# Patient Record
Sex: Female | Born: 1985
Health system: Southern US, Community
[De-identification: ages and names within clinical notes are randomized; demographics above are authoritative.]

## PROBLEM LIST (undated history)

## (undated) DIAGNOSIS — I4891 Unspecified atrial fibrillation: Secondary | ICD-10-CM

## (undated) DIAGNOSIS — E669 Obesity, unspecified: Secondary | ICD-10-CM

## (undated) DIAGNOSIS — Z3490 Encounter for supervision of normal pregnancy, unspecified, unspecified trimester: Secondary | ICD-10-CM

## (undated) DIAGNOSIS — B999 Unspecified infectious disease: Secondary | ICD-10-CM

## (undated) DIAGNOSIS — IMO0002 Reserved for concepts with insufficient information to code with codable children: Secondary | ICD-10-CM

## (undated) DIAGNOSIS — I10 Essential (primary) hypertension: Secondary | ICD-10-CM

## (undated) DIAGNOSIS — A599 Trichomoniasis, unspecified: Secondary | ICD-10-CM

## (undated) DIAGNOSIS — R87619 Unspecified abnormal cytological findings in specimens from cervix uteri: Secondary | ICD-10-CM

## (undated) DIAGNOSIS — R87629 Unspecified abnormal cytological findings in specimens from vagina: Secondary | ICD-10-CM

## (undated) HISTORY — PX: LEEP: SHX91

## (undated) HISTORY — PX: NO PAST SURGERIES: SHX2092

## (undated) HISTORY — PX: TOOTH EXTRACTION: SUR596

---
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
## 2007-06-13 ENCOUNTER — Emergency Department (HOSPITAL_COMMUNITY): Admission: EM | Admit: 2007-06-13 | Discharge: 2007-06-13 | Payer: Self-pay | Admitting: Emergency Medicine

## 2007-07-05 ENCOUNTER — Emergency Department (HOSPITAL_COMMUNITY): Admission: EM | Admit: 2007-07-05 | Discharge: 2007-07-05 | Payer: Self-pay | Admitting: Family Medicine

## 2007-10-05 ENCOUNTER — Inpatient Hospital Stay (HOSPITAL_COMMUNITY): Admission: AD | Admit: 2007-10-05 | Discharge: 2007-10-05 | Payer: Self-pay | Admitting: Obstetrics & Gynecology

## 2008-03-20 ENCOUNTER — Inpatient Hospital Stay (HOSPITAL_COMMUNITY): Admission: AD | Admit: 2008-03-20 | Discharge: 2008-03-20 | Payer: Self-pay | Admitting: Family Medicine

## 2008-03-29 ENCOUNTER — Emergency Department (HOSPITAL_COMMUNITY): Admission: EM | Admit: 2008-03-29 | Discharge: 2008-03-29 | Payer: Self-pay | Admitting: Emergency Medicine

## 2008-10-19 ENCOUNTER — Emergency Department (HOSPITAL_COMMUNITY): Admission: EM | Admit: 2008-10-19 | Discharge: 2008-10-19 | Payer: Self-pay | Admitting: Emergency Medicine

## 2008-11-21 ENCOUNTER — Emergency Department (HOSPITAL_COMMUNITY): Admission: EM | Admit: 2008-11-21 | Discharge: 2008-11-21 | Payer: Self-pay | Admitting: Family Medicine

## 2009-10-17 ENCOUNTER — Ambulatory Visit: Payer: Self-pay | Admitting: Obstetrics and Gynecology

## 2009-10-17 ENCOUNTER — Inpatient Hospital Stay (HOSPITAL_COMMUNITY): Admission: AD | Admit: 2009-10-17 | Discharge: 2009-10-17 | Payer: Self-pay | Admitting: Obstetrics & Gynecology

## 2009-12-27 ENCOUNTER — Ambulatory Visit: Payer: Self-pay | Admitting: Obstetrics & Gynecology

## 2009-12-27 ENCOUNTER — Encounter: Payer: Self-pay | Admitting: Obstetrics and Gynecology

## 2009-12-27 LAB — CONVERTED CEMR LAB
HCT: 39.4 % (ref 36.0–46.0)
Hemoglobin: 12.7 g/dL (ref 12.0–15.0)
Hgb A1c MFr Bld: 5.6 % (ref <5.7)
MCHC: 32.2 g/dL (ref 30.0–36.0)
MCV: 81.6 fL (ref 78.0–100.0)
Platelets: 300 10*3/uL (ref 150–400)
Prolactin: 11.8 ng/mL
RBC: 4.83 M/uL (ref 3.87–5.11)
RDW: 14.4 % (ref 11.5–15.5)
TSH: 3.118 microintl units/mL (ref 0.350–4.500)
Testosterone: 38.54 ng/dL (ref 10–70)
Trich, Wet Prep: NONE SEEN
WBC: 5.8 10*3/uL (ref 4.0–10.5)
Yeast Wet Prep HPF POC: NONE SEEN
hCG, Beta Chain, Quant, S: <2 milliintl units/mL

## 2010-01-01 ENCOUNTER — Ambulatory Visit (HOSPITAL_COMMUNITY)
Admission: RE | Admit: 2010-01-01 | Discharge: 2010-01-01 | Payer: Self-pay | Source: Home / Self Care | Admitting: Family Medicine

## 2010-03-03 ENCOUNTER — Emergency Department (HOSPITAL_COMMUNITY): Admission: EM | Admit: 2010-03-03 | Discharge: 2010-03-03 | Payer: Self-pay | Admitting: Emergency Medicine

## 2010-04-04 ENCOUNTER — Ambulatory Visit: Payer: Self-pay | Admitting: Obstetrics & Gynecology

## 2010-04-04 ENCOUNTER — Other Ambulatory Visit
Admission: RE | Admit: 2010-04-04 | Discharge: 2010-04-04 | Payer: Self-pay | Source: Home / Self Care | Admitting: Family Medicine

## 2010-05-02 ENCOUNTER — Emergency Department (HOSPITAL_COMMUNITY): Admission: EM | Admit: 2010-05-02 | Discharge: 2010-03-03 | Payer: Self-pay | Admitting: Emergency Medicine

## 2010-05-30 ENCOUNTER — Ambulatory Visit
Admission: RE | Admit: 2010-05-30 | Discharge: 2010-05-30 | Payer: Self-pay | Source: Home / Self Care | Attending: Obstetrics and Gynecology | Admitting: Obstetrics and Gynecology

## 2010-06-04 ENCOUNTER — Emergency Department (HOSPITAL_COMMUNITY)
Admission: EM | Admit: 2010-06-04 | Discharge: 2010-06-04 | Payer: Self-pay | Source: Home / Self Care | Admitting: Family Medicine

## 2010-06-20 ENCOUNTER — Ambulatory Visit: Admit: 2010-06-20 | Payer: Self-pay | Admitting: Obstetrics and Gynecology

## 2010-07-10 ENCOUNTER — Encounter: Payer: Self-pay | Admitting: Family Medicine

## 2010-08-06 LAB — POCT PREGNANCY, URINE: Preg Test, Ur: NEGATIVE

## 2010-08-12 LAB — URINALYSIS, ROUTINE W REFLEX MICROSCOPIC
Bilirubin Urine: NEGATIVE
Glucose, UA: NEGATIVE mg/dL
Hgb urine dipstick: NEGATIVE
Ketones, ur: NEGATIVE mg/dL
Nitrite: NEGATIVE
Protein, ur: NEGATIVE mg/dL
Specific Gravity, Urine: >1.03 — ABNORMAL HIGH (ref 1.005–1.030)
Urobilinogen, UA: 0.2 mg/dL (ref 0.0–1.0)
pH: 5.5 (ref 5.0–8.0)

## 2010-08-12 LAB — URINE MICROSCOPIC-ADD ON

## 2010-08-12 LAB — WET PREP, GENITAL: Trich, Wet Prep: NONE SEEN

## 2010-08-12 LAB — PROLACTIN: Prolactin: 8.5 ng/mL

## 2010-08-12 LAB — GC/CHLAMYDIA PROBE AMP, GENITAL
Chlamydia, DNA Probe: NEGATIVE
GC Probe Amp, Genital: NEGATIVE

## 2010-08-12 LAB — TSH: TSH: 2.983 u[IU]/mL (ref 0.350–4.500)

## 2010-08-12 LAB — HCG, SERUM, QUALITATIVE: Preg, Serum: NEGATIVE

## 2010-08-12 LAB — POCT PREGNANCY, URINE: Preg Test, Ur: NEGATIVE

## 2010-08-21 ENCOUNTER — Encounter: Payer: Self-pay | Admitting: Family Medicine

## 2010-09-02 LAB — POCT PREGNANCY, URINE: Preg Test, Ur: NEGATIVE

## 2010-09-02 LAB — POCT RAPID STREP A (OFFICE): Streptococcus, Group A Screen (Direct): NEGATIVE

## 2010-09-03 LAB — URINE MICROSCOPIC-ADD ON

## 2010-09-03 LAB — URINALYSIS, ROUTINE W REFLEX MICROSCOPIC
Bilirubin Urine: NEGATIVE
Glucose, UA: NEGATIVE mg/dL
Hgb urine dipstick: NEGATIVE
Ketones, ur: NEGATIVE mg/dL
Nitrite: NEGATIVE
Protein, ur: NEGATIVE mg/dL
Specific Gravity, Urine: 1.023 (ref 1.005–1.030)
Urobilinogen, UA: 1 mg/dL (ref 0.0–1.0)
pH: 6.5 (ref 5.0–8.0)

## 2010-09-03 LAB — POCT PREGNANCY, URINE: Preg Test, Ur: NEGATIVE

## 2010-09-09 ENCOUNTER — Emergency Department (HOSPITAL_COMMUNITY)
Admission: EM | Admit: 2010-09-09 | Discharge: 2010-09-10 | Disposition: A | Discharge status: Home / Self Care | Payer: Medicaid Other | Attending: Emergency Medicine | Admitting: Emergency Medicine

## 2010-09-09 DIAGNOSIS — N390 Urinary tract infection, site not specified: Secondary | ICD-10-CM | POA: Insufficient documentation

## 2010-09-09 DIAGNOSIS — B9689 Other specified bacterial agents as the cause of diseases classified elsewhere: Secondary | ICD-10-CM | POA: Insufficient documentation

## 2010-09-09 DIAGNOSIS — N760 Acute vaginitis: Secondary | ICD-10-CM | POA: Insufficient documentation

## 2010-09-09 DIAGNOSIS — R109 Unspecified abdominal pain: Secondary | ICD-10-CM | POA: Insufficient documentation

## 2010-09-09 DIAGNOSIS — A499 Bacterial infection, unspecified: Secondary | ICD-10-CM | POA: Insufficient documentation

## 2010-09-09 LAB — PREGNANCY, URINE: Preg Test, Ur: NEGATIVE

## 2010-09-09 LAB — URINALYSIS, ROUTINE W REFLEX MICROSCOPIC
Bilirubin Urine: NEGATIVE
Glucose, UA: NEGATIVE mg/dL
Hgb urine dipstick: NEGATIVE
Ketones, ur: NEGATIVE mg/dL
Nitrite: NEGATIVE
Protein, ur: NEGATIVE mg/dL
Specific Gravity, Urine: 1.012 (ref 1.005–1.030)
Urobilinogen, UA: 1 mg/dL (ref 0.0–1.0)
pH: 7.5 (ref 5.0–8.0)

## 2010-09-09 LAB — URINE MICROSCOPIC-ADD ON

## 2010-09-10 LAB — WET PREP, GENITAL
Trich, Wet Prep: NONE SEEN
Yeast Wet Prep HPF POC: NONE SEEN

## 2010-09-10 LAB — GC/CHLAMYDIA PROBE AMP, GENITAL
Chlamydia, DNA Probe: NEGATIVE
GC Probe Amp, Genital: NEGATIVE

## 2010-10-02 ENCOUNTER — Other Ambulatory Visit: Payer: Self-pay | Admitting: Family Medicine

## 2010-10-02 ENCOUNTER — Other Ambulatory Visit (HOSPITAL_COMMUNITY)
Admission: RE | Admit: 2010-10-02 | Discharge: 2010-10-02 | Disposition: A | Discharge status: Home / Self Care | Payer: Medicaid Other | Source: Ambulatory Visit | Attending: Family Medicine | Admitting: Family Medicine

## 2010-10-02 ENCOUNTER — Encounter (INDEPENDENT_AMBULATORY_CARE_PROVIDER_SITE_OTHER): Payer: Medicaid Other | Admitting: Family Medicine

## 2010-10-02 DIAGNOSIS — N870 Mild cervical dysplasia: Secondary | ICD-10-CM | POA: Insufficient documentation

## 2010-10-02 DIAGNOSIS — R87613 High grade squamous intraepithelial lesion on cytologic smear of cervix (HGSIL): Secondary | ICD-10-CM

## 2010-10-02 LAB — POCT PREGNANCY, URINE: Preg Test, Ur: NEGATIVE

## 2010-10-03 NOTE — Group Therapy Note (Signed)
NAMEMAJEL, GIEL                ACCOUNT NO.:  [id number]  MEDICAL RECORD NO.:  [id number]           PATIENT TYPE:  A  LOCATION:  [facility]                   FACILITY:  [facility]  PHYSICIAN:  Lucina Mellow, DO   DATE OF BIRTH:  12/20/1985  DATE OF SERVICE:                                 CLINIC NOTE  The patient presents today for a LEEP procedure.  HISTORY OF PRESENT ILLNESS:  The patient is a 25 year old African American female gravida 1, para 1 who had HGSIL noted on her colposcopy examination done in November 2011.  The patient was told that she needed to go LEEP procedure done and it was at that time noted that it was an emergency but in fact need to be done.  She did miss the appointment that was scheduled on November 28 and she has since made that appointment and she presents for that.  She states that she is very nervous about the procedure.  We talked about the procedure and she gives [place] a written permission and consent which is witnessed.  PREOPERATIVE DIAGNOSIS:  High-grade squamous intraepithelial lesion.  POSTOPERATIVE DIAGNOSIS:  high-grade squamous intraepithelial lesion, cervical intraepithelial neoplasia II.  Urine pregnancy test was negative.  ANESTHESIA:  Lidocaine 15 mL circumferential cervical block.  PROCEDURE:  The patient was placed in a dorsal lithotomy position.  Her cervix was identified easily with a speculum.  It was stained with acetowhite acetic acid and iodine to identify the lesion that was noted on the colposcopy at approximately 1 o'clock.  Area of the lesion previously noted at about the 7 o'clock area seems to have resolved. The appropriate sized LEEP curette was obtained and a circumferential incisional biopsy was made of the cervix.  Of note, the patient experienced some breakthrough pain when the cervix was being biopsied at approximately the 1 o'clock area and the procedure was stopped momentarily to inject the additional 5 mL of  lidocaine.  The LEEP was then completed and the biopsy samples were all placed into the specimen cup.  The rollerball then was used to control bleeding and Monsel solution was used to run to control bleeding.  The area was observed. There was minimal bleeding noted.  Speculum was removed and the procedure was ended.  The patient states that she was having severe cramping at the end of the procedure and she was given 800 mg of Motrin p.o. in the clinic for this pain.  Prescription provided include Percocet 5/325 to take for severe pain and 800 mg Motrin to take around the clock every 8 hours with meals to control for the cramping.  The patient was also given a prescription for Flagyl 500 mg b.i.d. to take now or to wait if she develops heavy discharge to help with probable bacterial vaginosis that will ensue.  The patient will follow up in clinic in approximately 2 weeks to discuss these results and if further treatment is necessary.  She can be followed by every 6 months Pap testing.  The patient's questions were all answered and she voiced understanding of the plan.          ______________________________  Lucina Mellow, DO    SH/MEDQ  D:  10/02/2010  T:  10/03/2010  Job:  [id number]

## 2010-10-16 ENCOUNTER — Ambulatory Visit (INDEPENDENT_AMBULATORY_CARE_PROVIDER_SITE_OTHER): Payer: Medicaid Other | Admitting: Family Medicine

## 2010-10-16 DIAGNOSIS — R87612 Low grade squamous intraepithelial lesion on cytologic smear of cervix (LGSIL): Secondary | ICD-10-CM

## 2010-10-17 NOTE — Group Therapy Note (Signed)
Deanna [name], Deanna [name]                ACCOUNT NO.:  [id number]  MEDICAL RECORD NO.:  [id number]           PATIENT TYPE:  A  LOCATION:  [facility]                   FACILITY:  [facility]  PHYSICIAN:  Lucina Mellow, DO   DATE OF BIRTH:  July 25, 1985  DATE OF SERVICE:                                 CLINIC NOTE  The patient presents to the [facility] the afternoon of Oct 16, 2010.  The patient presents for her LEEP results.  The patient states that since her procedure she had about 3 days of cramping and discomfort for which she took the Motrin and Percocet that control her discomfort.  She no longer feels as if she is having any discharge or discomfort.  She states that she has had breast pain which she thinks is related to the fact that her cycle should start any day now.  The patient was asking about ability to get pregnant at this time, as she would like to conceive.  She states she has not started intercourse at this time.  Lab results available as the pathology from the LEEP procedure and that showed low-grade squamous intraepithelial lesion CIN-I with negative surgical margins of resection.  ASSESSMENT:  LGISL on LEEP.  She had HGSIL on her Pap and her colposcopy, so the LGSIL is a downgrade from the lesion concern for negative surgical margins states that the procedure did not have to be repeated at this time.  The patient should have a repeat Pap every 6 months for approximately 2 years and then she can go to yearly screening after that point.  The patient's questions were all answered and she agrees with the plan.          ______________________________ Lucina Mellow, DO    SH/MEDQ  D:  10/16/2010  T:  10/17/2010  Job:  [id number]

## 2011-02-08 ENCOUNTER — Inpatient Hospital Stay (HOSPITAL_COMMUNITY)
Admission: AD | Admit: 2011-02-08 | Discharge: 2011-02-08 | Disposition: A | Discharge status: Home / Self Care | Payer: Self-pay | Source: Ambulatory Visit | Attending: Obstetrics and Gynecology | Admitting: Obstetrics and Gynecology

## 2011-02-08 ENCOUNTER — Encounter (HOSPITAL_COMMUNITY): Payer: Self-pay

## 2011-02-08 DIAGNOSIS — A5901 Trichomonal vulvovaginitis: Secondary | ICD-10-CM | POA: Insufficient documentation

## 2011-02-08 DIAGNOSIS — O26899 Other specified pregnancy related conditions, unspecified trimester: Secondary | ICD-10-CM

## 2011-02-08 DIAGNOSIS — B9689 Other specified bacterial agents as the cause of diseases classified elsewhere: Secondary | ICD-10-CM | POA: Insufficient documentation

## 2011-02-08 DIAGNOSIS — O2340 Unspecified infection of urinary tract in pregnancy, unspecified trimester: Secondary | ICD-10-CM

## 2011-02-08 DIAGNOSIS — R519 Headache, unspecified: Secondary | ICD-10-CM

## 2011-02-08 DIAGNOSIS — O2390 Unspecified genitourinary tract infection in pregnancy, unspecified trimester: Secondary | ICD-10-CM | POA: Insufficient documentation

## 2011-02-08 DIAGNOSIS — N760 Acute vaginitis: Secondary | ICD-10-CM | POA: Insufficient documentation

## 2011-02-08 DIAGNOSIS — N390 Urinary tract infection, site not specified: Secondary | ICD-10-CM | POA: Insufficient documentation

## 2011-02-08 DIAGNOSIS — A499 Bacterial infection, unspecified: Secondary | ICD-10-CM | POA: Insufficient documentation

## 2011-02-08 DIAGNOSIS — O98819 Other maternal infectious and parasitic diseases complicating pregnancy, unspecified trimester: Secondary | ICD-10-CM | POA: Insufficient documentation

## 2011-02-08 DIAGNOSIS — Z331 Pregnant state, incidental: Secondary | ICD-10-CM

## 2011-02-08 HISTORY — DX: Trichomoniasis, unspecified: A59.9

## 2011-02-08 LAB — URINE MICROSCOPIC-ADD ON

## 2011-02-08 LAB — URINALYSIS, ROUTINE W REFLEX MICROSCOPIC
Bilirubin Urine: NEGATIVE
Glucose, UA: NEGATIVE mg/dL
Hgb urine dipstick: NEGATIVE
Ketones, ur: NEGATIVE mg/dL
Nitrite: POSITIVE — AB
Protein, ur: NEGATIVE mg/dL
Specific Gravity, Urine: 1.02 (ref 1.005–1.030)
Urobilinogen, UA: 0.2 mg/dL (ref 0.0–1.0)
pH: 6.5 (ref 5.0–8.0)

## 2011-02-08 LAB — POCT PREGNANCY, URINE: Preg Test, Ur: POSITIVE

## 2011-02-08 LAB — WET PREP, GENITAL: Yeast Wet Prep HPF POC: NONE SEEN

## 2011-02-08 MED ORDER — ACETAMINOPHEN 325 MG PO TABS
650.0000 mg | ORAL_TABLET | Freq: Once | ORAL | Status: AC
  Administered 2011-02-08: 650 mg via ORAL
  Filled 2011-02-08: qty 2

## 2011-02-08 MED ORDER — PROMETHAZINE HCL 25 MG PO TABS: 25.0000 mg | ORAL_TABLET | Freq: Four times a day (QID) | ORAL | Status: AC | PRN

## 2011-02-08 MED ORDER — ONDANSETRON 8 MG PO TBDP: 8.0000 mg | ORAL_TABLET | Freq: Three times a day (TID) | ORAL | Status: AC | PRN

## 2011-02-08 MED ORDER — PRENATAL RX 60-1 MG PO TABS: 1.0000 | ORAL_TABLET | Freq: Every day | ORAL | Status: DC

## 2011-02-08 MED ORDER — METRONIDAZOLE 500 MG PO TABS: 500.0000 mg | ORAL_TABLET | Freq: Two times a day (BID) | ORAL | Status: AC

## 2011-02-08 MED ORDER — NITROFURANTOIN MONOHYD MACRO 100 MG PO CAPS
100.0000 mg | ORAL_CAPSULE | Freq: Once | ORAL | Status: AC
  Administered 2011-02-08: 100 mg via ORAL
  Filled 2011-02-08: qty 1

## 2011-02-08 NOTE — Progress Notes (Signed)
Patient reports last period 12/25/2010  Has been having headaches, 5 positive home pregnancy tests, did not take anything for headache.

## 2011-02-08 NOTE — ED Provider Notes (Signed)
History     Chief Complaint  Patient presents with  . Amenorrhea  . Headache   HPI Pt is [redacted]w[redacted]d pregnant with LMP May 1.  She complains of frontal headaches that come and go.  She has not had nausea or vision changes with her headaches.  She has not taken any medication for her headaches.  She took a home pregnancy test yesterday which was positive.  She denies spotting or bleeding or cramping.  She has been urinating frequently with adequate emptying.  She does have a history of UTI several months ago.     Past Medical History  Diagnosis Date  . Trichomonas     Past Surgical History  Procedure Date  . No past surgeries     History reviewed. No pertinent family history.  History  Substance Use Topics  . Smoking status: Not on file  . Smokeless tobacco: Not on file  . Alcohol Use: Not on file    Allergies: No Known Allergies  Prescriptions prior to admission  Medication Sig Dispense Refill  . ibuprofen (MIDOL CRAMP FORMULA MAX ST) 200 MG tablet Take 200 mg by mouth every 6 (six) hours as needed. For cramps.         Review of Systems  Constitutional: Negative.  Negative for fever and chills.  Eyes: Negative.  Negative for blurred vision.  Gastrointestinal: Positive for nausea. Negative for vomiting, abdominal pain, diarrhea and constipation.  Genitourinary: Positive for frequency. Negative for dysuria and urgency.  Skin: Negative.    Physical Exam   Blood pressure 123/76, pulse 88, temperature 98.6 F (37 C), temperature source Oral, resp. rate 18, height 5\' 5"  (1.651 m), weight 249 lb (112.946 kg), last menstrual period 12/25/2010.  Physical Exam  Vitals reviewed. Constitutional: She is oriented to person, place, and time. She appears well-developed and well-nourished.  HENT:  Head: Normocephalic.  Eyes: Pupils are equal, round, and reactive to light.  Neck: Normal range of motion. Neck supple.  Cardiovascular: Normal rate.   Respiratory: Effort normal.  GI:  Soft. There is no tenderness.  Genitourinary:       Vagina- small amount of frothy white discharge in vatul; cervix clean nontender; uterus NSSC nontender; adnexal without palpable enlargement; nontender; exam complicated by habitus  Musculoskeletal: Normal range of motion.  Neurological: She is alert and oriented to person, place, and time.  Skin: Skin is warm and dry.  Psychiatric: She has a normal mood and affect.    MAU Course  Procedures Tylenol given for headache and one dose of Macrobid Pelvic exam performed with wet prep and GC/ chlamydia Trich and clue cells on wet prep- will give prescription for Flagyl Urine culture ordered  Pregnancy verification letter Headache relieved with Tylenol   Assessment and Plan  Pregnant [redacted] weeks- pregnancy verification letter UTI-prescription for MAcrobid BID for 7 days Trichomonas and BV- prescription for Flagyl 500mg  BID for 7 days  Tashira Torre 02/08/2011, 8:23 AM

## 2011-02-08 NOTE — Progress Notes (Signed)
Pt presents to MAU with complaints of Amenorrhea LMP Aug 1, headaches and positive pregnancy tests times 5

## 2011-02-09 ENCOUNTER — Telehealth (HOSPITAL_COMMUNITY): Payer: Self-pay | Admitting: *Deleted

## 2011-02-10 LAB — URINE CULTURE
Colony Count: >=100000
Culture  Setup Time: 201209152010

## 2011-02-10 LAB — GC/CHLAMYDIA PROBE AMP, GENITAL
Chlamydia, DNA Probe: NEGATIVE
GC Probe Amp, Genital: NEGATIVE

## 2011-02-10 NOTE — ED Provider Notes (Signed)
Attestation of Attending Supervision of Advanced Practitioner: Evaluation and management procedures were performed by the PA/NP/CNM/OB Fellow under my supervision/collaboration. Chart reviewed and agree with management and plan.  Donyae Kilner A 02/10/2011 1:03 PM

## 2011-02-11 ENCOUNTER — Inpatient Hospital Stay (HOSPITAL_COMMUNITY)
Admission: AD | Admit: 2011-02-11 | Discharge: 2011-02-11 | Disposition: A | Discharge status: Home / Self Care | Payer: Medicaid Other | Source: Ambulatory Visit | Attending: Obstetrics & Gynecology | Admitting: Obstetrics & Gynecology

## 2011-02-11 ENCOUNTER — Inpatient Hospital Stay (HOSPITAL_COMMUNITY): Payer: Medicaid Other

## 2011-02-11 ENCOUNTER — Encounter (HOSPITAL_COMMUNITY): Payer: Self-pay | Admitting: *Deleted

## 2011-02-11 DIAGNOSIS — O219 Vomiting of pregnancy, unspecified: Secondary | ICD-10-CM

## 2011-02-11 DIAGNOSIS — O26899 Other specified pregnancy related conditions, unspecified trimester: Secondary | ICD-10-CM

## 2011-02-11 DIAGNOSIS — R519 Headache, unspecified: Secondary | ICD-10-CM

## 2011-02-11 DIAGNOSIS — R51 Headache: Secondary | ICD-10-CM

## 2011-02-11 DIAGNOSIS — O210 Mild hyperemesis gravidarum: Secondary | ICD-10-CM | POA: Insufficient documentation

## 2011-02-11 LAB — URINALYSIS, ROUTINE W REFLEX MICROSCOPIC
Bilirubin Urine: NEGATIVE
Glucose, UA: NEGATIVE mg/dL
Hgb urine dipstick: NEGATIVE
Ketones, ur: NEGATIVE mg/dL
Leukocytes, UA: NEGATIVE
Nitrite: NEGATIVE
Protein, ur: NEGATIVE mg/dL
Specific Gravity, Urine: 1.01 (ref 1.005–1.030)
Urobilinogen, UA: 0.2 mg/dL (ref 0.0–1.0)
pH: 6.5 (ref 5.0–8.0)

## 2011-02-11 MED ORDER — DEXAMETHASONE 0.5 MG PO TABS: 1.0000 mg | ORAL_TABLET | Freq: Once | ORAL | Status: DC

## 2011-02-11 MED ORDER — METOCLOPRAMIDE HCL 10 MG PO TABS
10.0000 mg | ORAL_TABLET | Freq: Once | ORAL | Status: AC
  Administered 2011-02-11: 10 mg via ORAL
  Filled 2011-02-11: qty 1

## 2011-02-11 MED ORDER — ACETAMINOPHEN-CODEINE #3 300-30 MG PO TABS: 1.0000 | ORAL_TABLET | Freq: Four times a day (QID) | ORAL | Status: AC | PRN

## 2011-02-11 MED ORDER — DIPHENHYDRAMINE HCL 25 MG PO CAPS
25.0000 mg | ORAL_CAPSULE | Freq: Once | ORAL | Status: AC
  Administered 2011-02-11: 25 mg via ORAL
  Filled 2011-02-11: qty 1

## 2011-02-11 MED ORDER — DEXAMETHASONE 0.5 MG PO TABS
1.0000 mg | ORAL_TABLET | Freq: Once | ORAL | Status: AC
  Administered 2011-02-11: 1 mg via ORAL
  Filled 2011-02-11: qty 2

## 2011-02-11 NOTE — ED Provider Notes (Signed)
History     No chief complaint on file.  HPI  Pt is [redacted]w[redacted]d pregnant and presents with headache and nausea and vomiting.  She was seen on Saturday 9/15 and was given a prescription for Zofran and Phenergan, however, pt did not get filled due to cost.  She has been taking Tylenol for her headaches which helps temporarily but returns.  Pt had occ headache prior to pregnancy.  Her headache is frontal.  She has been cramping earlier today and yesterday like menstrual cramps.   She denies constipation or diarrhea; no spotting or bleeding.  She denies pain with urination.    Past Medical History  Diagnosis Date  . Trichomonas     Past Surgical History  Procedure Date  . No past surgeries     No family history on file.  History  Substance Use Topics  . Smoking status: Never Smoker   . Smokeless tobacco: Not on file  . Alcohol Use: No    Allergies: No Known Allergies  Prescriptions prior to admission  Medication Sig Dispense Refill  . metroNIDAZOLE (FLAGYL) 500 MG tablet Take 1 tablet (500 mg total) by mouth 2 (two) times daily.  14 tablet  0  . Prenatal Vit-Fe Fumarate-FA (PRENATAL MULTIVITAMIN) 60-1 MG tablet Take 1 tablet by mouth daily.  30 tablet  5  . ondansetron (ZOFRAN ODT) 8 MG disintegrating tablet Take 1 tablet (8 mg total) by mouth every 8 (eight) hours as needed for nausea.  20 tablet  0  . promethazine (PHENERGAN) 25 MG tablet Take 1 tablet (25 mg total) by mouth every 6 (six) hours as needed for nausea.  30 tablet  0    Review of Systems  Constitutional: Negative.  Negative for fever.  Gastrointestinal: Positive for nausea and abdominal pain. Negative for constipation.  Neurological: Positive for dizziness and headaches. Negative for sensory change, speech change and focal weakness.   Physical Exam   Blood pressure 136/82, pulse 127, temperature 99.3 F (37.4 C), temperature source Oral, resp. rate 20, height 5\' 5"  (1.651 m), weight 250 lb (113.399 kg), last  menstrual period 12/25/2010.  Physical Exam  Vitals reviewed. Constitutional: She is oriented to person, place, and time. She appears well-developed and well-nourished.  HENT:  Head: Normocephalic.  Eyes: Pupils are equal, round, and reactive to light.  Cardiovascular: Normal rate.   Respiratory: Effort normal.  GI: Soft.  Musculoskeletal: Normal range of motion.  Neurological: She is alert and oriented to person, place, and time.  Skin: Skin is warm and dry.  Psychiatric: She has a normal mood and affect.    MAU Course  Procedures Technique: Transabdominal ultrasound was performed for evaluation  of the gestation as well as the maternal uterus and adnexal  regions.  Comparison: None.  Intrauterine gestational sac: Single intrauterine gestational sac  visualized, normal in shape.  Yolk sac: Present  Embryo: Present  Cardiac Activity: Present  Heart Rate: 97 bpm  CRL: 2.2 mm 5w 6d [place] EDC: 10/07/2028  Maternal uterus/Adnexae:  No subchorionic hemorrhage. Normal sonographic appearance to the  ovaries. No free fluid.  IMPRESSION:  Single intrauterine gestation identified, with cardiac activity  documented. Estimated age of 5 weeks 6 days.      Assessment and Plan  Headache in pregnancy- prescription for Tylenol #3 if needed Nausea and vomiting in pregnancy- prescriptions for Phenergan and Zofran with discussion of small frequent meals and pt instructions for morning sickness Follow up with OB care  Deanna [name] 02/11/2011, 8:16 PM

## 2011-02-11 NOTE — Progress Notes (Signed)
Pt states she has been on flagyl since Sat-every time she takes it she vomits-she started getting lightfheaded yesterday and it has gotten worse today,has a headache unrelieved by tylenol and is cramping

## 2011-02-12 ENCOUNTER — Telehealth: Payer: Self-pay | Admitting: *Deleted

## 2011-02-12 NOTE — Telephone Encounter (Signed)
Pt left message stating that she was told by the doctor to call us as soon as she had a positive pregnancy test because of her past surgery and cancer cells.

## 2011-02-12 NOTE — Telephone Encounter (Signed)
Called pt. She has initiated her application for medicaid and has her first appt @ [facility] on 02/13/11. I clarified to pt that she does not and did not have cancer. I explained that she had abnormal cells on her pap and Leep which over time might turn into cancer and she should continue with close follow up as advised. I also stated that if there is any reason for her to be seen @ our clinic, she will be referred by [facility].  Pt voiced understanding

## 2011-02-13 LAB — WET PREP, GENITAL
Trich, Wet Prep: NONE SEEN
Yeast Wet Prep HPF POC: NONE SEEN

## 2011-02-13 LAB — POCT URINALYSIS DIP (DEVICE)
Bilirubin Urine: NEGATIVE
Glucose, UA: NEGATIVE
Hgb urine dipstick: NEGATIVE
Ketones, ur: NEGATIVE
Nitrite: NEGATIVE
Operator id: 270961
Protein, ur: NEGATIVE
Specific Gravity, Urine: 1.025
Urobilinogen, UA: 1
pH: 7

## 2011-02-13 LAB — POCT PREGNANCY, URINE
Operator id: 270961
Preg Test, Ur: NEGATIVE

## 2011-02-13 LAB — GC/CHLAMYDIA PROBE AMP, GENITAL
Chlamydia, DNA Probe: NEGATIVE
GC Probe Amp, Genital: NEGATIVE

## 2011-02-17 NOTE — Telephone Encounter (Signed)
Agree with information provided by Diane Day to the patient about initiating care with the HD.

## 2011-02-24 LAB — URINALYSIS, ROUTINE W REFLEX MICROSCOPIC
Bilirubin Urine: NEGATIVE
Glucose, UA: NEGATIVE
Hgb urine dipstick: NEGATIVE
Ketones, ur: NEGATIVE
Nitrite: NEGATIVE
Protein, ur: NEGATIVE
Specific Gravity, Urine: <1.005 — ABNORMAL LOW
Urobilinogen, UA: 0.2
pH: 6.5

## 2011-02-24 LAB — RAPID STREP SCREEN (MED CTR MEBANE ONLY): Streptococcus, Group A Screen (Direct): NEGATIVE

## 2011-02-24 LAB — POCT PREGNANCY, URINE: Preg Test, Ur: NEGATIVE

## 2011-03-25 ENCOUNTER — Inpatient Hospital Stay (HOSPITAL_COMMUNITY)
Admission: AD | Admit: 2011-03-25 | Discharge: 2011-03-25 | Disposition: A | Discharge status: Home / Self Care | Payer: Self-pay | Source: Ambulatory Visit | Attending: Obstetrics & Gynecology | Admitting: Obstetrics & Gynecology

## 2011-03-25 ENCOUNTER — Encounter (HOSPITAL_COMMUNITY): Payer: Self-pay | Admitting: *Deleted

## 2011-03-25 DIAGNOSIS — O99891 Other specified diseases and conditions complicating pregnancy: Secondary | ICD-10-CM | POA: Insufficient documentation

## 2011-03-25 DIAGNOSIS — J029 Acute pharyngitis, unspecified: Secondary | ICD-10-CM

## 2011-03-25 DIAGNOSIS — J069 Acute upper respiratory infection, unspecified: Secondary | ICD-10-CM | POA: Insufficient documentation

## 2011-03-25 HISTORY — DX: Reserved for concepts with insufficient information to code with codable children: IMO0002

## 2011-03-25 HISTORY — DX: Unspecified abnormal cytological findings in specimens from cervix uteri: R87.619

## 2011-03-25 LAB — URINALYSIS, ROUTINE W REFLEX MICROSCOPIC
Bilirubin Urine: NEGATIVE
Glucose, UA: NEGATIVE mg/dL
Hgb urine dipstick: NEGATIVE
Ketones, ur: NEGATIVE mg/dL
Leukocytes, UA: NEGATIVE
Nitrite: NEGATIVE
Protein, ur: NEGATIVE mg/dL
Specific Gravity, Urine: 1.02 (ref 1.005–1.030)
Urobilinogen, UA: 0.2 mg/dL (ref 0.0–1.0)
pH: 6 (ref 5.0–8.0)

## 2011-03-25 MED ORDER — ONDANSETRON 4 MG PO TBDP: 4.0000 mg | ORAL_TABLET | Freq: Three times a day (TID) | ORAL | Status: AC | PRN

## 2011-03-25 MED ORDER — ONDANSETRON 4 MG PO TBDP
4.0000 mg | ORAL_TABLET | Freq: Once | ORAL | Status: AC
  Administered 2011-03-25: 4 mg via ORAL
  Filled 2011-03-25: qty 1

## 2011-03-25 MED ORDER — ACETAMINOPHEN 500 MG PO TABS
1000.0000 mg | ORAL_TABLET | Freq: Once | ORAL | Status: AC
  Administered 2011-03-25: 1000 mg via ORAL
  Filled 2011-03-25: qty 2

## 2011-03-25 NOTE — Discharge Instructions (Signed)
Salt Water Gargle This solution will help make your mouth and throat feel better. HOME CARE INSTRUCTIONS   Mix 1 teaspoon of salt in 8 ounces of warm water.   Gargle with this solution as much or often as you need or as directed. Swish and gargle gently if you have any sores or wounds in your mouth.   Do not swallow this mixture.  Document Released: 02/14/2004 Document Revised: 01/22/2011 Document Reviewed: 07/07/2008 [facility] Patient Information 2012 [place], [place].

## 2011-03-25 NOTE — Progress Notes (Signed)
Pt states h/a and sore throat began last pm, also has sharp pain on r side, lower abdomen. Denies bleeding or vag d/c changes. Feeling nauseated at times, intermittent vomiting every other day since beginning of pregnancy. No medications taken for s/s.

## 2011-03-25 NOTE — ED Provider Notes (Signed)
[employer] [id number] @[redacted]w[redacted]d  Chief Complaint  Patient presents with  . Headache  . Sore Throat    SUBJECTIVE  HPI: Reports one-day history of nasal congestion, sore throat, headache. She has pain with swallowing. 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 She also has nausea and vomiting which has been present throughout the pregnancy but she has not filled her prescriptions for antiemetics due to not getting Medicaid yet. She has tried no treatments. She would like a work excuse. Review of Systems  Constitutional: Positive for malaise/fatigue. Negative for fever and chills.  HENT: Positive for congestion and sore throat. Negative for ear pain and neck pain.   Eyes: Negative for pain.  Respiratory: Negative for cough, sputum production, shortness of breath and stridor.   Cardiovascular: Negative for chest pain.  Gastrointestinal: Positive for nausea and vomiting. Negative for abdominal pain, diarrhea and constipation.  Genitourinary: Negative for dysuria, urgency and frequency.  Musculoskeletal: Negative for myalgias and back pain.  Neurological: Positive for headaches.    Past Medical History  Diagnosis Date  . Trichomonas   . Abnormal Pap smear     Past Surgical History  Procedure Date  . Leep    History   Social History  . Marital Status: Single    Spouse Name: N/A    Number of Children: N/A  . Years of Education: N/A   Occupational History  . Not on file.   Social History Main Topics  . Smoking status: Never Smoker   . Smokeless tobacco: Not on file  . Alcohol Use: No  . Drug Use: No  . Sexually Active:    Other Topics Concern  . Not on file   Social History Narrative  . No narrative on file   No current facility-administered medications on file prior to encounter.   No current outpatient prescriptions on file prior to encounter.   No Known Allergies  ROS: Pertinent items in HPI  OBJECTIVE  BP 122/57  Pulse 105  Temp(Src)  99 F (37.2 C) (Oral)  Resp 20  Ht 5\' 5"  (1.651 m)  Wt 113.513 kg (250 lb 4 oz)  BMI 41.64 kg/m2  LMP 12/25/2010   DT FHR 160  Physical Exam:  General: WN/WD who looks fatigued and uncomfortable. HEENT:  No tender to percussion over sinuses; EOMs intact, sclerae clear; TMs normal; oropharynx slightly red tonsils 2+, no exudate Neck: supple, shotty and tender ant cervical nodes Abd: soft, nontender Lungs: CTA bilat Pelvic: deferred Back: no CVAT Ext: no edema   MAU course:  After Tylenol and Zofran felt better. Retaining solids. Wants to leave before  GrpABS throat screen result back  ASSESSMENT Viral URI with pharyngitis      PLAN Advised Cepacol, fluids, rest  Work excuse given. Precautions to return for fever or lower respiratory symptoms occur

## 2011-04-01 LAB — OB RESULTS CONSOLE ABO/RH: RH Type: POSITIVE

## 2011-04-01 LAB — OB RESULTS CONSOLE RPR: RPR: NONREACTIVE

## 2011-04-01 LAB — OB RESULTS CONSOLE HIV ANTIBODY (ROUTINE TESTING): HIV: NONREACTIVE

## 2011-04-01 LAB — OB RESULTS CONSOLE HEPATITIS B SURFACE ANTIGEN: Hepatitis B Surface Ag: NEGATIVE

## 2011-04-09 LAB — OB RESULTS CONSOLE ANTIBODY SCREEN: Antibody Screen: NEGATIVE

## 2011-04-09 LAB — OB RESULTS CONSOLE RUBELLA ANTIBODY, IGM: Rubella: IMMUNE

## 2011-05-27 NOTE — L&D Delivery Note (Signed)
Delivery Note At 5:02 PM a viable female was delivered via Vaginal, Spontaneous Delivery (Presentation: Left Occiput Anterior).  APGAR: 9, 10; weight P .   Placenta status: Intact, Spontaneous.  Cord: 3 vessels with the following complications: None.  Anesthesia: Epidural  Episiotomy: None Lacerations: Vaginal;Labial Suture Repair: 3.0 vicryl rapide Est. Blood Loss (mL): 500  Mom to postpartum.  Baby to stay with mother.  [name],Deanna [name] 10/04/2011, 5:27 PM  Br/ B+/ Contra?

## 2011-09-09 LAB — OB RESULTS CONSOLE GBS: GBS: POSITIVE

## 2011-10-03 ENCOUNTER — Encounter (HOSPITAL_COMMUNITY): Payer: Self-pay | Admitting: *Deleted

## 2011-10-03 ENCOUNTER — Inpatient Hospital Stay (HOSPITAL_COMMUNITY)
Admission: AD | Admit: 2011-10-03 | Discharge: 2011-10-03 | Disposition: A | Discharge status: Home / Self Care | Payer: Medicaid Other | Source: Ambulatory Visit | Attending: Obstetrics and Gynecology | Admitting: Obstetrics and Gynecology

## 2011-10-03 ENCOUNTER — Encounter (HOSPITAL_COMMUNITY): Payer: Self-pay

## 2011-10-03 DIAGNOSIS — O479 False labor, unspecified: Secondary | ICD-10-CM | POA: Insufficient documentation

## 2011-10-03 MED ORDER — BUTORPHANOL TARTRATE 2 MG/ML IJ SOLN
2.0000 mg | Freq: Once | INTRAMUSCULAR | Status: AC
  Administered 2011-10-03: 2 mg via INTRAMUSCULAR
  Filled 2011-10-03: qty 1

## 2011-10-03 MED ORDER — OXYCODONE-ACETAMINOPHEN 5-325 MG PO TABS
2.0000 | ORAL_TABLET | Freq: Once | ORAL | Status: AC
  Administered 2011-10-03: 2 via ORAL
  Filled 2011-10-03: qty 2

## 2011-10-03 NOTE — MAU Note (Signed)
Pt up to BR

## 2011-10-03 NOTE — Discharge Instructions (Signed)
Fetal Movement Counts Patient Name: __________________________________________________ Patient Due Date: ____________________ Kick counts is highly recommended in high risk pregnancies, but it is a good idea for every pregnant woman to do. Start counting fetal movements at 28 weeks of the pregnancy. Fetal movements increase after eating a full meal or eating or drinking something sweet (the blood sugar is higher). It is also important to drink plenty of fluids (well hydrated) before doing the count. Lie on your left side because it helps with the circulation or you can sit in a comfortable chair with your arms over your belly (abdomen) with no distractions around you. DOING THE COUNT  Try to do the count the same time of day each time you do it.   Mark the day and time, then see how long it takes for you to feel 10 movements (kicks, flutters, swishes, rolls). You should have at least 10 movements within 2 hours. You will most likely feel 10 movements in much less than 2 hours. If you do not, wait an hour and count again. After a couple of days you will see a pattern.   What you are looking for is a change in the pattern or not enough counts in 2 hours. Is it taking longer in time to reach 10 movements?  SEEK MEDICAL CARE IF:  You feel less than 10 counts in 2 hours. Tried twice.   No movement in one hour.   The pattern is changing or taking longer each day to reach 10 counts in 2 hours.   You feel the baby is not moving as it usually does.  Date: ____________ Movements: ____________ Start time: ____________ Finish time: ____________  Date: ____________ Movements: ____________ Start time: ____________ Finish time: ____________ Date: ____________ Movements: ____________ Start time: ____________ Finish time: ____________ Date: ____________ Movements: ____________ Start time: ____________ Finish time: ____________ Date: ____________ Movements: ____________ Start time: ____________ Finish time:  ____________ Date: ____________ Movements: ____________ Start time: ____________ Finish time: ____________ Date: ____________ Movements: ____________ Start time: ____________ Finish time: ____________ Date: ____________ Movements: ____________ Start time: ____________ Finish time: ____________  Date: ____________ Movements: ____________ Start time: ____________ Finish time: ____________ Date: ____________ Movements: ____________ Start time: ____________ Finish time: ____________ Date: ____________ Movements: ____________ Start time: ____________ Finish time: ____________ Date: ____________ Movements: ____________ Start time: ____________ Finish time: ____________ Date: ____________ Movements: ____________ Start time: ____________ Finish time: ____________ Date: ____________ Movements: ____________ Start time: ____________ Finish time: ____________ Date: ____________ Movements: ____________ Start time: ____________ Finish time: ____________  Date: ____________ Movements: ____________ Start time: ____________ Finish time: ____________ Date: ____________ Movements: ____________ Start time: ____________ Finish time: ____________ Date: ____________ Movements: ____________ Start time: ____________ Finish time: ____________ Date: ____________ Movements: ____________ Start time: ____________ Finish time: ____________ Date: ____________ Movements: ____________ Start time: ____________ Finish time: ____________ Date: ____________ Movements: ____________ Start time: ____________ Finish time: ____________ Date: ____________ Movements: ____________ Start time: ____________ Finish time: ____________  Date: ____________ Movements: ____________ Start time: ____________ Finish time: ____________ Date: ____________ Movements: ____________ Start time: ____________ Finish time: ____________ Date: ____________ Movements: ____________ Start time: ____________ Finish time: ____________ Date: ____________ Movements:  ____________ Start time: ____________ Finish time: ____________ Date: ____________ Movements: ____________ Start time: ____________ Finish time: ____________ Date: ____________ Movements: ____________ Start time: ____________ Finish time: ____________ Date: ____________ Movements: ____________ Start time: ____________ Finish time: ____________  Date: ____________ Movements: ____________ Start time: ____________ Finish time: ____________ Date: ____________ Movements: ____________ Start time: ____________ Finish time: ____________ Date: ____________ Movements: ____________ Start time:   ____________ Finish time: ____________ Date: ____________ Movements: ____________ Start time: ____________ Finish time: ____________ Date: ____________ Movements: ____________ Start time: ____________ Finish time: ____________ Date: ____________ Movements: ____________ Start time: ____________ Finish time: ____________ Date: ____________ Movements: ____________ Start time: ____________ Finish time: ____________  Date: ____________ Movements: ____________ Start time: ____________ Finish time: ____________ Date: ____________ Movements: ____________ Start time: ____________ Finish time: ____________ Date: ____________ Movements: ____________ Start time: ____________ Finish time: ____________ Date: ____________ Movements: ____________ Start time: ____________ Finish time: ____________ Date: ____________ Movements: ____________ Start time: ____________ Finish time: ____________ Date: ____________ Movements: ____________ Start time: ____________ Finish time: ____________ Date: ____________ Movements: ____________ Start time: ____________ Finish time: ____________  Date: ____________ Movements: ____________ Start time: ____________ Finish time: ____________ Date: ____________ Movements: ____________ Start time: ____________ Finish time: ____________ Date: ____________ Movements: ____________ Start time: ____________ Finish  time: ____________ Date: ____________ Movements: ____________ Start time: ____________ Finish time: ____________ Date: ____________ Movements: ____________ Start time: ____________ Finish time: ____________ Date: ____________ Movements: ____________ Start time: ____________ Finish time: ____________ Date: ____________ Movements: ____________ Start time: ____________ Finish time: ____________  Date: ____________ Movements: ____________ Start time: ____________ Finish time: ____________ Date: ____________ Movements: ____________ Start time: ____________ Finish time: ____________ Date: ____________ Movements: ____________ Start time: ____________ Finish time: ____________ Date: ____________ Movements: ____________ Start time: ____________ Finish time: ____________ Date: ____________ Movements: ____________ Start time: ____________ Finish time: ____________ Date: ____________ Movements: ____________ Start time: ____________ Finish time: ____________ Document Released: 06/11/2006 Document Revised: 05/01/2011 Document Reviewed: 12/12/2008 [employer] Patient Information 2012 [employer].Normal Labor and Delivery Your caregiver must first be sure you are in labor. Signs of labor include:  You may pass what is called "the mucus plug" before labor begins. This is a small amount of blood stained mucus.   Regular uterine contractions.   The time between contractions get closer together.   The discomfort and pain gradually gets more intense.   Pains are mostly located in the back.   Pains get worse when walking.   The cervix (the opening of the uterus becomes thinner (begins to efface) and opens up (dilates).  Once you are in labor and admitted into the hospital or care center, your caregiver will do the following:  A complete physical examination.   Check your vital signs (blood pressure, pulse, temperature and the fetal heart rate).   Do a vaginal examination (using a sterile glove and  lubricant) to determine:   The position (presentation) of the baby (head [vertex] or buttock first).   The level (station) of the baby's head in the birth canal.   The effacement and dilatation of the cervix.   You may have your pubic hair shaved and be given an enema depending on your caregiver and the circumstance.   An electronic monitor is usually placed on your abdomen. The monitor follows the length and intensity of the contractions, as well as the baby's heart rate.   Usually, your caregiver will insert an IV in your arm with a bottle of sugar water. This is done as a precaution so that medications can be given to you quickly during labor or delivery.  NORMAL LABOR AND DELIVERY IS DIVIDED UP INTO 3 STAGES: First Stage This is when regular contractions begin and the cervix begins to efface and dilate. This stage can last from 3 to 15 hours. The end of the first stage is when the cervix is 100% effaced and 10 centimeters dilated. Pain medications may be given by   Injection (morphine, demerol,   etc.)   Regional anesthesia (spinal, caudal or epidural, anesthetics given in different locations of the spine). Paracervical pain medication may be given, which is an injection of and anesthetic on each side of the cervix.  A pregnant woman may request to have "Natural Childbirth" which is not to have any medications or anesthesia during her labor and delivery. Second Stage This is when the baby comes down through the birth canal (vagina) and is born. This can take 1 to 4 hours. As the baby's head comes down through the birth canal, you may feel like you are going to have a bowel movement. You will get the urge to bear down and push until the baby is delivered. As the baby's head is being delivered, the caregiver will decide if an episiotomy (a cut in the perineum and vagina area) is needed to prevent tearing of the tissue in this area. The episiotomy is sewn up after the delivery of the baby and  placenta. Sometimes a mask with nitrous oxide is given for the mother to breath during the delivery of the baby to help if there is too much pain. The end of Stage 2 is when the baby is fully delivered. Then when the umbilical cord stops pulsating it is clamped and cut. Third Stage The third stage begins after the baby is completely delivered and ends after the placenta (afterbirth) is delivered. This usually takes 5 to 30 minutes. After the placenta is delivered, a medication is given either by intravenous or injection to help contract the uterus and prevent bleeding. The third stage is not painful and pain medication is usually not necessary. If an episiotomy was done, it is repaired at this time. After the delivery, the mother is watched and monitored closely for 1 to 2 hours to make sure there is no postpartum bleeding (hemorrhage). If there is a lot of bleeding, medication is given to contract the uterus and stop the bleeding. Document Released: 02/19/2008 Document Revised: 05/01/2011 Document Reviewed: 02/19/2008 [employer] Patient Information 2012 [employer].  

## 2011-10-03 NOTE — MAU Note (Signed)
Dr. Ellyn Hack notified pt's cervix unchanged, slightly softer however still 1/50/-3, intact. Bp's reviewed. EFM tracing reactive. Pt to d/c home with labor precautions.

## 2011-10-03 NOTE — MAU Note (Signed)
Ambien 5mg  tab called to [facility] on [address]. Take 1 po prn hs and may repeat x 1 if needed. Dispense 2 and no refills.

## 2011-10-03 NOTE — MAU Note (Signed)
Pt states ctx's began last pm around 2200. Now q57minutes per pt. Denies lof. Notes bloody show. +FM. Last exam cervix closed, vertex.

## 2011-10-03 NOTE — MAU Note (Addendum)
Dr. Ellyn Hack notified of pt's c/o ctx's, was closed at last office visit. Cervix 1/40/-3, posterior. Intact. Ctx's irregular. Orders for pt to walk x2 hours then recheck cervix.

## 2011-10-03 NOTE — MAU Note (Signed)
Pt seen in MAU this a.m., DC'd home, states uc's are closer & stronger, some bloody show, denies LOF.

## 2011-10-03 NOTE — Progress Notes (Signed)
Written and verbal d/c instructions given and understanding voiced. 

## 2011-10-03 NOTE — Progress Notes (Signed)
Dr Ellyn Hack notified of pt's status. Aware of cervix without change, firmness to cervix and hx LEEP, unsure of presenting part and baby high. PO pain med ordered, will call in Ambien, and pt may go home.

## 2011-10-03 NOTE — Discharge Instructions (Signed)
Normal Labor and Delivery Your caregiver must first be sure you are in labor. Signs of labor include:  You may pass what is called "the mucus plug" before labor begins. This is a small amount of blood stained mucus.   Regular uterine contractions.   The time between contractions get closer together.   The discomfort and pain gradually gets more intense.   Pains are mostly located in the back.   Pains get worse when walking.   The cervix (the opening of the uterus becomes thinner (begins to efface) and opens up (dilates).  Once you are in labor and admitted into the hospital or care center, your caregiver will do the following:  A complete physical examination.   Check your vital signs (blood pressure, pulse, temperature and the fetal heart rate).   Do a vaginal examination (using a sterile glove and lubricant) to determine:   The position (presentation) of the baby (head [vertex] or buttock first).   The level (station) of the baby's head in the birth canal.   The effacement and dilatation of the cervix.   You may have your pubic hair shaved and be given an enema depending on your caregiver and the circumstance.   An electronic monitor is usually placed on your abdomen. The monitor follows the length and intensity of the contractions, as well as the baby's heart rate.   Usually, your caregiver will insert an IV in your arm with a bottle of sugar water. This is done as a precaution so that medications can be given to you quickly during labor or delivery.  NORMAL LABOR AND DELIVERY IS DIVIDED UP INTO 3 STAGES: First Stage This is when regular contractions begin and the cervix begins to efface and dilate. This stage can last from 3 to 15 hours. The end of the first stage is when the cervix is 100% effaced and 10 centimeters dilated. Pain medications may be given by   Injection (morphine, demerol, etc.)   Regional anesthesia (spinal, caudal or epidural, anesthetics given in  different locations of the spine). Paracervical pain medication may be given, which is an injection of and anesthetic on each side of the cervix.  A pregnant woman may request to have "Natural Childbirth" which is not to have any medications or anesthesia during her labor and delivery. Second Stage This is when the baby comes down through the birth canal (vagina) and is born. This can take 1 to 4 hours. As the baby's head comes down through the birth canal, you may feel like you are going to have a bowel movement. You will get the urge to bear down and push until the baby is delivered. As the baby's head is being delivered, the caregiver will decide if an episiotomy (a cut in the perineum and vagina area) is needed to prevent tearing of the tissue in this area. The episiotomy is sewn up after the delivery of the baby and placenta. Sometimes a mask with nitrous oxide is given for the mother to breath during the delivery of the baby to help if there is too much pain. The end of Stage 2 is when the baby is fully delivered. Then when the umbilical cord stops pulsating it is clamped and cut. Third Stage The third stage begins after the baby is completely delivered and ends after the placenta (afterbirth) is delivered. This usually takes 5 to 30 minutes. After the placenta is delivered, a medication is given either by intravenous or injection to help contract   the uterus and prevent bleeding. The third stage is not painful and pain medication is usually not necessary. If an episiotomy was done, it is repaired at this time. After the delivery, the mother is watched and monitored closely for 1 to 2 hours to make sure there is no postpartum bleeding (hemorrhage). If there is a lot of bleeding, medication is given to contract the uterus and stop the bleeding. Document Released: 02/19/2008 Document Revised: 05/01/2011 Document Reviewed: 02/19/2008 [employer] Patient Information 2012 [employer]. 

## 2011-10-03 NOTE — Progress Notes (Signed)
Discussed position changes for comfort, warm shower, taking Ambien at bedtime, drinking lots of fld.

## 2011-10-04 ENCOUNTER — Inpatient Hospital Stay (HOSPITAL_COMMUNITY)
Admission: AD | Admit: 2011-10-04 | Discharge: 2011-10-06 | DRG: 775 | Disposition: A | Discharge status: Home / Self Care | Payer: Medicaid Other | Source: Ambulatory Visit | Attending: Obstetrics and Gynecology | Admitting: Obstetrics and Gynecology

## 2011-10-04 ENCOUNTER — Encounter (HOSPITAL_COMMUNITY): Payer: Self-pay | Admitting: Anesthesiology

## 2011-10-04 ENCOUNTER — Encounter (HOSPITAL_COMMUNITY): Payer: Self-pay | Admitting: Obstetrics and Gynecology

## 2011-10-04 ENCOUNTER — Encounter (HOSPITAL_COMMUNITY): Payer: Self-pay | Admitting: *Deleted

## 2011-10-04 ENCOUNTER — Inpatient Hospital Stay (HOSPITAL_COMMUNITY): Payer: Medicaid Other | Admitting: Anesthesiology

## 2011-10-04 DIAGNOSIS — Z22330 Carrier of Group B streptococcus: Secondary | ICD-10-CM

## 2011-10-04 DIAGNOSIS — O99892 Other specified diseases and conditions complicating childbirth: Secondary | ICD-10-CM | POA: Diagnosis present

## 2011-10-04 DIAGNOSIS — Z3490 Encounter for supervision of normal pregnancy, unspecified, unspecified trimester: Secondary | ICD-10-CM

## 2011-10-04 HISTORY — DX: Encounter for supervision of normal pregnancy, unspecified, unspecified trimester: Z34.90

## 2011-10-04 LAB — CBC
HCT: 40 % (ref 36.0–46.0)
Hemoglobin: 13.4 g/dL (ref 12.0–15.0)
MCH: 26.7 pg (ref 26.0–34.0)
MCHC: 33.5 g/dL (ref 30.0–36.0)
MCV: 79.8 fL (ref 78.0–100.0)
Platelets: 265 10*3/uL (ref 150–400)
RBC: 5.01 MIL/uL (ref 3.87–5.11)
RDW: 14.7 % (ref 11.5–15.5)
WBC: 7.9 10*3/uL (ref 4.0–10.5)

## 2011-10-04 LAB — RPR: RPR Ser Ql: NONREACTIVE

## 2011-10-04 MED ORDER — LACTATED RINGERS IV SOLN
500.0000 mL | Freq: Once | INTRAVENOUS | Status: AC
  Administered 2011-10-04: 10:00:00 via INTRAVENOUS

## 2011-10-04 MED ORDER — LIDOCAINE HCL (PF) 1 % IJ SOLN
INTRAMUSCULAR | Status: DC | PRN
  Administered 2011-10-04 (×2): 4 mL

## 2011-10-04 MED ORDER — ONDANSETRON HCL 4 MG PO TABS: 4.0000 mg | ORAL_TABLET | ORAL | Status: DC | PRN

## 2011-10-04 MED ORDER — DIBUCAINE 1 % RE OINT: 1.0000 "application " | TOPICAL_OINTMENT | RECTAL | Status: DC | PRN

## 2011-10-04 MED ORDER — DEXTROSE 5 % IV SOLN
5.0000 10*6.[IU] | Freq: Once | INTRAVENOUS | Status: AC
  Administered 2011-10-04: 5 10*6.[IU] via INTRAVENOUS
  Filled 2011-10-04: qty 5

## 2011-10-04 MED ORDER — OXYTOCIN BOLUS FROM INFUSION
500.0000 mL | Freq: Once | INTRAVENOUS | Status: DC
  Filled 2011-10-04: qty 500

## 2011-10-04 MED ORDER — IBUPROFEN 600 MG PO TABS: 600.0000 mg | ORAL_TABLET | Freq: Four times a day (QID) | ORAL | Status: DC | PRN

## 2011-10-04 MED ORDER — FENTANYL 2.5 MCG/ML BUPIVACAINE 1/10 % EPIDURAL INFUSION (WH - ANES)
14.0000 mL/h | INTRAMUSCULAR | Status: DC
  Administered 2011-10-04: 14 mL/h via EPIDURAL
  Filled 2011-10-04 (×3): qty 60

## 2011-10-04 MED ORDER — OXYTOCIN 20 UNITS IN LACTATED RINGERS INFUSION - SIMPLE: 125.0000 mL/h | Freq: Once | INTRAVENOUS | Status: DC

## 2011-10-04 MED ORDER — PRENATAL MULTIVITAMIN CH
1.0000 | ORAL_TABLET | Freq: Every day | ORAL | Status: DC
  Administered 2011-10-04 – 2011-10-06 (×3): 1 via ORAL
  Filled 2011-10-04 (×3): qty 1

## 2011-10-04 MED ORDER — IBUPROFEN 600 MG PO TABS
600.0000 mg | ORAL_TABLET | Freq: Four times a day (QID) | ORAL | Status: DC
  Administered 2011-10-04 – 2011-10-06 (×8): 600 mg via ORAL
  Filled 2011-10-04 (×8): qty 1

## 2011-10-04 MED ORDER — WITCH HAZEL-GLYCERIN EX PADS: 1.0000 "application " | MEDICATED_PAD | CUTANEOUS | Status: DC | PRN

## 2011-10-04 MED ORDER — OXYCODONE-ACETAMINOPHEN 5-325 MG PO TABS
1.0000 | ORAL_TABLET | ORAL | Status: DC | PRN
  Administered 2011-10-05: 1 via ORAL
  Filled 2011-10-04: qty 1

## 2011-10-04 MED ORDER — LACTATED RINGERS IV SOLN: INTRAVENOUS | Status: DC

## 2011-10-04 MED ORDER — PHENYLEPHRINE 40 MCG/ML (10ML) SYRINGE FOR IV PUSH (FOR BLOOD PRESSURE SUPPORT)
80.0000 ug | PREFILLED_SYRINGE | INTRAVENOUS | Status: DC | PRN
  Filled 2011-10-04: qty 5

## 2011-10-04 MED ORDER — DEXTROSE 5 % IV SOLN
2.5000 10*6.[IU] | INTRAVENOUS | Status: DC
  Administered 2011-10-04: 2.5 10*6.[IU] via INTRAVENOUS
  Filled 2011-10-04 (×4): qty 2.5

## 2011-10-04 MED ORDER — OXYTOCIN 20 UNITS IN LACTATED RINGERS INFUSION - SIMPLE
1.0000 m[IU]/min | INTRAVENOUS | Status: DC
  Administered 2011-10-04: 333 m[IU]/min via INTRAVENOUS
  Administered 2011-10-04: 2 m[IU]/min via INTRAVENOUS
  Filled 2011-10-04: qty 1000

## 2011-10-04 MED ORDER — PRENATAL RX 60-1 MG PO TABS
1.0000 | ORAL_TABLET | Freq: Every day | ORAL | Status: DC
Start: 2011-10-04 — End: 2011-10-04

## 2011-10-04 MED ORDER — OXYCODONE-ACETAMINOPHEN 5-325 MG PO TABS: 1.0000 | ORAL_TABLET | ORAL | Status: DC | PRN

## 2011-10-04 MED ORDER — DIPHENHYDRAMINE HCL 50 MG/ML IJ SOLN: 12.5000 mg | INTRAMUSCULAR | Status: DC | PRN

## 2011-10-04 MED ORDER — FENTANYL 2.5 MCG/ML BUPIVACAINE 1/10 % EPIDURAL INFUSION (WH - ANES)
INTRAMUSCULAR | Status: DC | PRN
  Administered 2011-10-04: 13 mL/h via EPIDURAL

## 2011-10-04 MED ORDER — PHENYLEPHRINE 40 MCG/ML (10ML) SYRINGE FOR IV PUSH (FOR BLOOD PRESSURE SUPPORT): 80.0000 ug | PREFILLED_SYRINGE | INTRAVENOUS | Status: DC | PRN

## 2011-10-04 MED ORDER — SIMETHICONE 80 MG PO CHEW: 80.0000 mg | CHEWABLE_TABLET | ORAL | Status: DC | PRN

## 2011-10-04 MED ORDER — LACTATED RINGERS IV SOLN
500.0000 mL | INTRAVENOUS | Status: DC | PRN
  Administered 2011-10-04: 1000 mL via INTRAVENOUS

## 2011-10-04 MED ORDER — CITRIC ACID-SODIUM CITRATE 334-500 MG/5ML PO SOLN: 30.0000 mL | ORAL | Status: DC | PRN

## 2011-10-04 MED ORDER — EPHEDRINE 5 MG/ML INJ: 10.0000 mg | INTRAVENOUS | Status: DC | PRN

## 2011-10-04 MED ORDER — SENNOSIDES-DOCUSATE SODIUM 8.6-50 MG PO TABS
2.0000 | ORAL_TABLET | Freq: Every day | ORAL | Status: DC
  Administered 2011-10-04 – 2011-10-05 (×2): 2 via ORAL

## 2011-10-04 MED ORDER — LIDOCAINE HCL (PF) 1 % IJ SOLN
30.0000 mL | INTRAMUSCULAR | Status: DC | PRN
  Filled 2011-10-04: qty 30

## 2011-10-04 MED ORDER — EPHEDRINE 5 MG/ML INJ
10.0000 mg | INTRAVENOUS | Status: DC | PRN
  Filled 2011-10-04: qty 4

## 2011-10-04 MED ORDER — LACTATED RINGERS IV SOLN
INTRAVENOUS | Status: DC
  Administered 2011-10-04 (×2): via INTRAVENOUS

## 2011-10-04 MED ORDER — ONDANSETRON HCL 4 MG/2ML IJ SOLN
4.0000 mg | Freq: Four times a day (QID) | INTRAMUSCULAR | Status: DC | PRN
  Administered 2011-10-04: 4 mg via INTRAVENOUS
  Filled 2011-10-04: qty 2

## 2011-10-04 MED ORDER — FLEET ENEMA 7-19 GM/118ML RE ENEM: 1.0000 | ENEMA | RECTAL | Status: DC | PRN

## 2011-10-04 MED ORDER — ONDANSETRON HCL 4 MG/2ML IJ SOLN: 4.0000 mg | INTRAMUSCULAR | Status: DC | PRN

## 2011-10-04 MED ORDER — BUTORPHANOL TARTRATE 2 MG/ML IJ SOLN: 2.0000 mg | INTRAMUSCULAR | Status: DC | PRN

## 2011-10-04 MED ORDER — TETANUS-DIPHTH-ACELL PERTUSSIS 5-2.5-18.5 LF-MCG/0.5 IM SUSP: 0.5000 mL | Freq: Once | INTRAMUSCULAR | Status: DC

## 2011-10-04 MED ORDER — DIPHENHYDRAMINE HCL 25 MG PO CAPS: 25.0000 mg | ORAL_CAPSULE | Freq: Four times a day (QID) | ORAL | Status: DC | PRN

## 2011-10-04 MED ORDER — TERBUTALINE SULFATE 1 MG/ML IJ SOLN: 0.2500 mg | Freq: Once | INTRAMUSCULAR | Status: DC | PRN

## 2011-10-04 MED ORDER — BENZOCAINE-MENTHOL 20-0.5 % EX AERO: 1.0000 "application " | INHALATION_SPRAY | CUTANEOUS | Status: DC | PRN

## 2011-10-04 MED ORDER — ACETAMINOPHEN 325 MG PO TABS: 650.0000 mg | ORAL_TABLET | ORAL | Status: DC | PRN

## 2011-10-04 MED ORDER — LANOLIN HYDROUS EX OINT: TOPICAL_OINTMENT | CUTANEOUS | Status: DC | PRN

## 2011-10-04 MED ORDER — ZOLPIDEM TARTRATE 5 MG PO TABS: 5.0000 mg | ORAL_TABLET | Freq: Every evening | ORAL | Status: DC | PRN

## 2011-10-04 NOTE — Anesthesia Preprocedure Evaluation (Signed)
Anesthesia Evaluation  Patient identified by MRN, date of birth, ID band Patient awake    Reviewed: Allergy & Precautions, H&P , Patient's Chart, lab work & pertinent test results  Airway Mallampati: III TM Distance: >3 FB Neck ROM: full    Dental No notable dental hx. (+) Teeth Intact   Pulmonary neg pulmonary ROS,  breath sounds clear to auscultation  Pulmonary exam normal       Cardiovascular negative cardio ROS  Rhythm:regular Rate:Normal     Neuro/Psych negative neurological ROS  negative psych ROS   GI/Hepatic negative GI ROS, Neg liver ROS,   Endo/Other  Morbid obesity  Renal/GU negative Renal ROS  negative genitourinary   Musculoskeletal   Abdominal Normal abdominal exam  (+)   Peds  Hematology negative hematology ROS (+)   Anesthesia Other Findings   Reproductive/Obstetrics (+) Pregnancy                           Anesthesia Physical Anesthesia Plan  ASA: III  Anesthesia Plan: Epidural   Post-op Pain Management:    Induction:   Airway Management Planned:   Additional Equipment:   Intra-op Plan:   Post-operative Plan:   Informed Consent: I have reviewed the patients History and Physical, chart, labs and discussed the procedure including the risks, benefits and alternatives for the proposed anesthesia with the patient or authorized representative who has indicated his/her understanding and acceptance.     Plan Discussed with: Anesthesiologist  Anesthesia Plan Comments:         Anesthesia Quick Evaluation  

## 2011-10-04 NOTE — Anesthesia Procedure Notes (Signed)
Epidural Patient location during procedure: OB Start time: 10/04/2011 10:38 AM  Staffing Anesthesiologist: Pleasant Bensinger A. Performed by: anesthesiologist   Preanesthetic Checklist Completed: patient identified, site marked, surgical consent, pre-op evaluation, timeout performed, IV checked, risks and benefits discussed and monitors and equipment checked  Epidural Patient position: sitting Prep: site prepped and draped and DuraPrep Patient monitoring: continuous pulse ox and blood pressure Approach: midline Injection technique: LOR air  Needle:  Needle type: Tuohy  Needle gauge: 17 G Needle length: 9 cm Needle insertion depth: 6 cm Catheter type: closed end flexible Catheter size: 19 Gauge Catheter at skin depth: 11 cm Test dose: negative and Other  Assessment Events: blood not aspirated, injection not painful, no injection resistance, negative IV test and no paresthesia  Additional Notes Patient identified. Risks and benefits discussed including failed block, incomplete  Pain control, post dural puncture headache, nerve damage, paralysis, blood pressure Changes, nausea, vomiting, reactions to medications-both toxic and allergic and post Partum back pain. All questions were answered. Patient expressed understanding and wished to proceed. Sterile technique was used throughout procedure. Epidural site was Dressed with sterile barrier dressing. No paresthesias, signs of intravascular injection Or signs of intrathecal spread were encountered.  Patient was more comfortable after the epidural was dosed. Please see RN's note for documentation of vital signs and FHR which are stable.

## 2011-10-04 NOTE — H&P (Signed)
Deanna [name] is a 26 y.o. female G2P1001 at 39+ with ROM, clear at 8:30 am.  +FM, no VB,  occ ctx; uncomplicated pnc except GBBS+ Maternal Medical History:  Reason for admission: Reason for admission: rupture of membranes and contractions.  Fetal activity: Perceived fetal activity is normal.      OB History    Grav Para Term Preterm Abortions TAB SAB Ect Mult Living   2 1 1  0 0 0 0 0 0 1    G1 6#2, female; G2 present, + abn pap, LEEP; + tricch Past Medical History  Diagnosis Date  . Trichomonas   . Abnormal Pap smear   . Normal pregnancy 10/04/2011   Past Surgical History  Procedure Date  . Leep    Family History: family history includes Diabetes in her sister and Hypertension in her father and mother.  There is no history of Anesthesia problems, and Hypotension, and Malignant hyperthermia, and Pseudochol deficiency, . Social History:  reports that she has never smoked. She has never used smokeless tobacco. She reports that she does not drink alcohol or use illicit drugs. Meds PNV All NKDA  Review of Systems  Constitutional: Negative.   HENT: Negative.   Eyes: Negative.   Respiratory: Negative.   Cardiovascular: Negative.   Gastrointestinal: Negative.   Genitourinary: Negative.   Musculoskeletal: Negative.   Skin: Negative.   Neurological: Negative.   Psychiatric/Behavioral: Negative.       Blood pressure 124/78, pulse 106, temperature 99.1 F (37.3 C), temperature source Oral, resp. rate 18, height 5\' 5"  (1.651 m), weight 117.482 kg (259 lb), last menstrual period 12/25/2010, SpO2 100.00%. Maternal Exam:  Abdomen: Patient reports no abdominal tenderness. Fundal height is appropriate for gestation.   Fetal presentation: vertex     Physical Exam  Constitutional: She is oriented to person, place, and time. She appears well-developed and well-nourished.  HENT:  Head: Normocephalic and atraumatic.  Neck: Normal range of motion. Neck supple.  Cardiovascular: Normal  rate and regular rhythm.   Respiratory: Effort normal and breath sounds normal. No respiratory distress.  GI: Soft. Bowel sounds are normal. She exhibits no distension.  Musculoskeletal: Normal range of motion.  Neurological: She is alert and oriented to person, place, and time.  Skin: Skin is warm and dry.  Psychiatric: She has a normal mood and affect. Her behavior is normal.    Prenatal labs: ABO, Rh: B/Positive/-- (11/06 0000) Antibody: Negative (11/14 0000) Rubella: Immune (11/14 0000) RPR: Nonreactive (11/06 0000)  HBsAg: Negative (11/06 0000)  HIV: Non-reactive (11/06 0000)  GBS: Positive (04/16 0000)  Pap WNL/ Hgb 12.0/ Plt 289 K/ Hgb electro WNL/ GC neg/ Chl neg/ CF neg/ First Tri Scr and AFP WNL/ glucola 103/   EDC 5/15 initially limited anat, f/u completes anatomy, ant plac, female  Assessment/Plan: 26yo G2P1001 at 39+ with onset of labor and ROM, clear fluid Epidural for comfort, expect SVD Pitocin to augment prn gbbs +, PCN for prophylaxis   Deanna [name],Deanna [name] 10/04/2011, 11:09 AM

## 2011-10-05 LAB — CBC
HCT: 33.2 % — ABNORMAL LOW (ref 36.0–46.0)
Hemoglobin: 11 g/dL — ABNORMAL LOW (ref 12.0–15.0)
MCH: 26.6 pg (ref 26.0–34.0)
MCHC: 33.1 g/dL (ref 30.0–36.0)
MCV: 80.4 fL (ref 78.0–100.0)
Platelets: 194 10*3/uL (ref 150–400)
RBC: 4.13 MIL/uL (ref 3.87–5.11)
RDW: 14.7 % (ref 11.5–15.5)
WBC: 11.4 10*3/uL — ABNORMAL HIGH (ref 4.0–10.5)

## 2011-10-05 NOTE — Anesthesia Postprocedure Evaluation (Signed)
Anesthesia Post Note  Patient: Deanna [name]  Procedure(s) Performed: * No procedures listed *  Anesthesia type: Epidural  Patient location: Mother/Baby  Post pain: Pain level controlled  Post assessment: Post-op Vital signs reviewed  Last Vitals:  Filed Vitals:   10/05/11 0524  BP: 141/85  Pulse: 58  Temp: 36.9 C  Resp: 20    Post vital signs: Reviewed  Level of consciousness: awake  Complications: No apparent anesthesia complications

## 2011-10-05 NOTE — Progress Notes (Signed)
Post Partum Day 1 Subjective: no complaints, tolerating PO and nl lochia, pain controlled  Objective: Blood pressure 117/75, pulse 62, temperature 98.8 F (37.1 C), temperature source Oral, resp. rate 18, height 5\' 5"  (1.651 m), weight 117.482 kg (259 lb), last menstrual period 12/25/2010, SpO2 100.00%, unknown if currently breastfeeding.  Physical Exam:  General: alert and no distress Lochia: appropriate Uterine Fundus: firm  Basename 10/05/11 0535 10/04/11 0940  HGB 11.0* 13.4  HCT 33.2* 40.0    Assessment/Plan: Plan for discharge tomorrow, Breastfeeding and Lactation consult   LOS: 1 day   [name],Ocean Schildt 10/05/2011, 10:06 AM

## 2011-10-06 MED ORDER — IBUPROFEN 800 MG PO TABS: 800.0000 mg | ORAL_TABLET | Freq: Three times a day (TID) | ORAL | Status: AC | PRN

## 2011-10-06 MED ORDER — PRENATAL MULTIVITAMIN CH: 1.0000 | ORAL_TABLET | Freq: Every day | ORAL | Status: DC

## 2011-10-06 MED ORDER — OXYCODONE-ACETAMINOPHEN 5-325 MG PO TABS: 1.0000 | ORAL_TABLET | Freq: Four times a day (QID) | ORAL | Status: AC | PRN

## 2011-10-06 NOTE — Progress Notes (Signed)
Post Partum Day 2 Subjective: no complaints, up ad lib, tolerating PO and nl lochia, pain controlled  Objective: Blood pressure 132/86, pulse 69, temperature 98.7 F (37.1 C), temperature source Oral, resp. rate 18, height 5\' 5"  (1.651 m), weight 117.482 kg (259 lb), last menstrual period 12/25/2010, SpO2 100.00%, unknown if currently breastfeeding.  Physical Exam:  General: alert and no distress Lochia: appropriate Uterine Fundus: firm   Basename 10/05/11 0535 10/04/11 0940  HGB 11.0* 13.4  HCT 33.2* 40.0    Assessment/Plan: Discharge home.  D/c with motrin/percocet/pnv, f/u 6 weeks   LOS: 2 days   [name],Deanna Andy 10/06/2011, 7:44 AM

## 2011-10-06 NOTE — Discharge Summary (Signed)
Obstetric Discharge Summary Reason for Admission: onset of labor, ROM Prenatal Procedures: none Intrapartum Procedures: spontaneous vaginal delivery Postpartum Procedures: none Complications-Operative and Postpartum: vaginal and labial laceration Hemoglobin  Date Value Range Status  10/05/2011 11.0* 12.0-15.0 (g/dL) Final     REPEATED TO VERIFY     DELTA CHECK NOTED     HCT  Date Value Range Status  10/05/2011 33.2* 36.0-46.0 (%) Final    Physical Exam:  General: alert and no distress Lochia: appropriate Uterine Fundus: firm  Discharge Diagnoses: Term Pregnancy-delivered  Discharge Information: Date: 10/06/2011 Activity: pelvic rest Diet: routine Medications: PNV, Ibuprofen and Percocet Condition: stable Instructions: refer to practice specific booklet Discharge to: home Follow-up Information    Follow up with [name],Devany Aja, MD. Schedule an appointment as soon as possible for a visit in 6 weeks.   Contact information:   [address] [facility] [address] [place] [address] [phone]          Newborn Data: Live born female  Birth Weight: 7 lb 6.2 oz (3350 g) APGAR: 9, 10  Home with mother.  [name],Jaanai Salemi 10/06/2011, 7:51 AM

## 2011-10-06 NOTE — Progress Notes (Signed)
UR chart review completed.  

## 2012-03-12 ENCOUNTER — Encounter (HOSPITAL_COMMUNITY): Payer: Self-pay | Admitting: *Deleted

## 2012-03-12 ENCOUNTER — Inpatient Hospital Stay (HOSPITAL_COMMUNITY)
Admission: AD | Admit: 2012-03-12 | Discharge: 2012-03-12 | Disposition: A | Discharge status: Home / Self Care | Payer: Self-pay | Source: Ambulatory Visit | Attending: Obstetrics & Gynecology | Admitting: Obstetrics & Gynecology

## 2012-03-12 DIAGNOSIS — N938 Other specified abnormal uterine and vaginal bleeding: Secondary | ICD-10-CM | POA: Insufficient documentation

## 2012-03-12 DIAGNOSIS — N920 Excessive and frequent menstruation with regular cycle: Secondary | ICD-10-CM

## 2012-03-12 DIAGNOSIS — N921 Excessive and frequent menstruation with irregular cycle: Secondary | ICD-10-CM

## 2012-03-12 DIAGNOSIS — N949 Unspecified condition associated with female genital organs and menstrual cycle: Secondary | ICD-10-CM | POA: Insufficient documentation

## 2012-03-12 LAB — CBC
HCT: 36.8 % (ref 36.0–46.0)
Hemoglobin: 11.7 g/dL — ABNORMAL LOW (ref 12.0–15.0)
MCH: 25.9 pg — ABNORMAL LOW (ref 26.0–34.0)
MCHC: 31.8 g/dL (ref 30.0–36.0)
MCV: 81.4 fL (ref 78.0–100.0)
Platelets: 259 10*3/uL (ref 150–400)
RBC: 4.52 MIL/uL (ref 3.87–5.11)
RDW: 13.3 % (ref 11.5–15.5)
WBC: 5.3 10*3/uL (ref 4.0–10.5)

## 2012-03-12 LAB — WET PREP, GENITAL
Trich, Wet Prep: NONE SEEN
Yeast Wet Prep HPF POC: NONE SEEN

## 2012-03-12 LAB — POCT PREGNANCY, URINE: Preg Test, Ur: NEGATIVE

## 2012-03-12 MED ORDER — NORGESTIMATE-ETH ESTRADIOL 0.25-35 MG-MCG PO TABS: 1.0000 | ORAL_TABLET | Freq: Every day | ORAL | Status: DC

## 2012-03-12 NOTE — MAU Note (Signed)
States Dr. Ellyn Hack delivered her baby, but she is no longer a patient at that practice. States it was for pregnancy only.

## 2012-03-12 NOTE — MAU Note (Signed)
States she has been bleeding for 3 weeks. States she had a baby 5 months ago and resumed normal periods until last month. States she did not have a period last month, then started bleeding on 10/2 and has bled every day since then. States this has never happened before.

## 2012-03-12 NOTE — MAU Provider Note (Signed)
Chief Complaint: Vaginal Bleeding   First Provider Initiated Contact with Patient 03/12/12 1308     SUBJECTIVE HPI: Deanna [name] is a 26 y.o. [id number] who presents with bleeding for 3 weeks. States she had a baby 5 months ago and resumed normal periods until last month. States she did not have a period last month, then started bleeding on 10/2 and has bled every day since then. States this has never happened before. Denies dizziness, abd pain, any Hx of other heavy bleeding.   Past Medical History  Diagnosis Date  . Trichomonas   . Abnormal Pap smear   . Normal pregnancy 10/04/2011  . SVD (spontaneous vaginal delivery) 10/04/2011   OB History    Grav Para Term Preterm Abortions TAB SAB Ect Mult Living   2 2 2  0 0 0 0 0 0 2     # Outc Date GA Lbr Len/2nd Wgt Sex Del Anes PTL Lv   1 TRM 5/13 [redacted]w[redacted]d 33:40 / 00:22 7lb6.2oz(3.35kg) F SVD EPI  Yes   2 TRM              Past Surgical History  Procedure Date  . Leep    History   Social History  . Marital Status: Married    Spouse Name: N/A    Number of Children: N/A  . Years of Education: N/A   Occupational History  . Not on file.   Social History Main Topics  . Smoking status: Never Smoker   . Smokeless tobacco: Never Used  . Alcohol Use: No  . Drug Use: No  . Sexually Active: Yes     no intercourse since onset on bleeding   Other Topics Concern  . Not on file   Social History Narrative  . No narrative on file   No current facility-administered medications on file prior to encounter.   Current Outpatient Prescriptions on File Prior to Encounter  Medication Sig Dispense Refill  . Prenatal Vit-Fe Fumarate-FA (PRENATAL MULTIVITAMIN) TABS Take 1 tablet by mouth daily.  30 tablet  12   No Known Allergies  ROS: Pertinent items in HPI  OBJECTIVE Blood pressure 125/74, pulse 90, temperature 98.4 F (36.9 C), temperature source Oral, resp. rate 20, height 5\' 5"  (1.651 m), weight 121.11 kg (267 lb), last menstrual period  02/25/2012, SpO2 100.00%, not currently breastfeeding. GENERAL: Well-developed, well-nourished female in no acute distress.  HEENT: Normocephalic HEART: normal rate RESP: normal effort ABDOMEN: Soft, non-tender EXTREMITIES: Nontender, no edema NEURO: Alert and oriented SPECULUM EXAM: NEFG, physiologic discharge, small-mod amount of dark red blood noted, cervix clean BIMANUAL: cervix closed; uterus normal size, no, CMT or adnexal tenderness or masses  LAB RESULTS Results for orders placed during the hospital encounter of 03/12/12 (from the past 24 hour(s))  POCT PREGNANCY, URINE     Status: Normal   Collection Time   03/12/12 12:31 PM      Component Value Range   Preg Test, Ur NEGATIVE  NEGATIVE  CBC     Status: Abnormal   Collection Time   03/12/12  1:01 PM      Component Value Range   WBC 5.3  4.0 - 10.5 K/uL   RBC 4.52  3.87 - 5.11 MIL/uL   Hemoglobin 11.7 (*) 12.0 - 15.0 g/dL   HCT 45.4  09.8 - 11.9 %   MCV 81.4  78.0 - 100.0 fL   MCH 25.9 (*) 26.0 - 34.0 pg   MCHC 31.8  30.0 - 36.0 g/dL  RDW 13.3  11.5 - 15.5 %   Platelets 259  150 - 400 K/uL    IMAGING  MAU COURSE  ASSESSMENT 1. Menometrorrhagia    PLAN Discharge home Safe sex practices discussed.  Bleeding precautions. May need pelvic US if menorrhagia continues. Follow-up Information    Follow up with Gynecologist. In 1 month.      Follow up with [facility] [place] [facility] ADMISS[facility]. (As needed if symptoms worsen)    Contact information:   [address] [id number] mc [place] [address] [phone]          Medication List     As of 03/16/2012 12:42 PM    TAKE these medications         ibuprofen 800 MG tablet   Commonly known as: ADVIL,MOTRIN   Take 800 mg by mouth daily as needed.      norgestimate-ethinyl estradiol 0.25-35 MG-MCG tablet   Commonly known as: ORTHO-CYCLEN,SPRINTEC,PREVIFEM   Take 1 tablet by mouth daily. Twice a day until bleeding  stops, than once per day.      prenatal multivitamin Tabs   Take 1 tablet by mouth daily.          [place], CNM 03/12/2012  1:35 PM

## 2012-03-13 LAB — GC/CHLAMYDIA PROBE AMP, GENITAL
Chlamydia, DNA Probe: NEGATIVE
GC Probe Amp, Genital: NEGATIVE

## 2012-03-17 NOTE — MAU Provider Note (Signed)
Attestation of Attending Supervision of Advanced Practitioner (CNM/NP): Evaluation and management procedures were performed by the Advanced Practitioner under my supervision and collaboration.  I have reviewed the Advanced Practitioner's note and chart, and I agree with the management and plan.  HARRAWAY-SMITH, Lamere Lightner 9:44 AM     

## 2012-05-14 ENCOUNTER — Emergency Department (HOSPITAL_COMMUNITY)
Admission: EM | Admit: 2012-05-14 | Discharge: 2012-05-14 | Disposition: A | Discharge status: Home / Self Care | Payer: Self-pay | Source: Home / Self Care

## 2012-05-14 ENCOUNTER — Encounter (HOSPITAL_COMMUNITY): Payer: Self-pay | Admitting: Emergency Medicine

## 2012-05-14 DIAGNOSIS — A088 Other specified intestinal infections: Secondary | ICD-10-CM

## 2012-05-14 DIAGNOSIS — A084 Viral intestinal infection, unspecified: Secondary | ICD-10-CM

## 2012-05-14 LAB — POCT URINALYSIS DIP (DEVICE)
Bilirubin Urine: NEGATIVE
Glucose, UA: NEGATIVE mg/dL
Hgb urine dipstick: NEGATIVE
Ketones, ur: NEGATIVE mg/dL
Leukocytes, UA: NEGATIVE
Nitrite: NEGATIVE
Protein, ur: NEGATIVE mg/dL
Specific Gravity, Urine: 1.02 (ref 1.005–1.030)
Urobilinogen, UA: 1 mg/dL (ref 0.0–1.0)
pH: 7 (ref 5.0–8.0)

## 2012-05-14 LAB — POCT PREGNANCY, URINE: Preg Test, Ur: NEGATIVE

## 2012-05-14 MED ORDER — ONDANSETRON 4 MG PO TBDP
ORAL_TABLET | ORAL | Status: AC
  Filled 2012-05-14: qty 2

## 2012-05-14 MED ORDER — ONDANSETRON 4 MG PO TBDP
8.0000 mg | ORAL_TABLET | Freq: Once | ORAL | Status: AC
  Administered 2012-05-14: 8 mg via ORAL

## 2012-05-14 MED ORDER — ONDANSETRON HCL 4 MG PO TABS: 4.0000 mg | ORAL_TABLET | Freq: Four times a day (QID) | ORAL | Status: DC

## 2012-05-14 NOTE — ED Notes (Addendum)
Reports vomiting all night last night after she got home from eating chicken.  reports diarrhea last night.  Denies abd pain.  Vomited this morning was liquid.  No appetite this morning.

## 2012-05-14 NOTE — ED Provider Notes (Signed)
History     CSN: [id number]  Arrival date & time 05/14/12  1127   None     Chief Complaint  Patient presents with  . Emesis    (Consider location/radiation/quality/duration/timing/severity/associated sxs/prior treatment) Patient is a 26 y.o. female presenting with vomiting. The history is provided by the patient.  Emesis  This is a new problem. The current episode started 12 to 24 hours ago. The problem occurs 2 to 4 times per day. The problem has not changed since onset.The emesis has an appearance of stomach contents. There has been no fever. Associated symptoms include diarrhea. Pertinent negatives include no abdominal pain, no chills, no fever, no headaches, no sweats and no URI. Risk factors include suspect food intake.    Past Medical History  Diagnosis Date  . Trichomonas   . Abnormal Pap smear   . Normal pregnancy 10/04/2011  . SVD (spontaneous vaginal delivery) 10/04/2011    Past Surgical History  Procedure Date  . Leep     Family History  Problem Relation Age of Onset  . Anesthesia problems Neg Hx   . Hypotension Neg Hx   . Malignant hyperthermia Neg Hx   . Pseudochol deficiency Neg Hx   . Hypertension Mother   . Hypertension Father   . Diabetes Sister     History  Substance Use Topics  . Smoking status: Never Smoker   . Smokeless tobacco: Never Used  . Alcohol Use: No    OB History    Grav Para Term Preterm Abortions TAB SAB Ect Mult Living   2 2 2  0 0 0 0 0 0 2      Review of Systems  Constitutional: Negative for fever, chills and appetite change.  HENT: Negative.   Gastrointestinal: Positive for vomiting and diarrhea. Negative for nausea, abdominal pain, constipation and rectal pain.  Neurological: Negative for headaches.    Allergies  Review of patient's allergies indicates no known allergies.  Home Medications   Current Outpatient Rx  Name  Route  Sig  Dispense  Refill  . IBUPROFEN 800 MG PO TABS   Oral   Take 800 mg by mouth  daily as needed.         Suzzanne Cloud ESTRADIOL 0.25-35 MG-MCG PO TABS   Oral   Take 1 tablet by mouth daily. Twice a day until bleeding stops, than once per day.   1 Package   3   . ONDANSETRON HCL 4 MG PO TABS   Oral   Take 1 tablet (4 mg total) by mouth every 6 (six) hours. For n/v   8 tablet   0   . PRENATAL MULTIVITAMIN CH   Oral   Take 1 tablet by mouth daily.   30 tablet   12     BP 126/56  Pulse 115  Temp 98.3 F (36.8 C) (Oral)  SpO2 98%  Breastfeeding? Unknown  Physical Exam  Nursing note and vitals reviewed. Constitutional: She is oriented to person, place, and time. She appears well-developed and well-nourished. No distress.  HENT:  Head: Normocephalic.  Right Ear: External ear normal.  Left Ear: External ear normal.  Mouth/Throat: Oropharynx is clear and moist.  Eyes: Pupils are equal, round, and reactive to light.  Neck: Normal range of motion. Neck supple.  Cardiovascular: Normal rate and regular rhythm.   Pulmonary/Chest: Breath sounds normal.  Abdominal: Soft. Bowel sounds are normal. She exhibits no distension and no mass. There is no tenderness. There is no rebound and  no guarding.  Lymphadenopathy:    She has no cervical adenopathy.  Neurological: She is alert and oriented to person, place, and time.  Skin: Skin is warm and dry.  Psychiatric: She has a normal mood and affect.    ED Course  Procedures (including critical care time)  Labs Reviewed - No data to display No results found.   1. Gastroenteritis and colitis, viral       MDM          Linna Hoff, MD 05/14/12 1159

## 2012-06-15 ENCOUNTER — Inpatient Hospital Stay (HOSPITAL_COMMUNITY)
Admission: AD | Admit: 2012-06-15 | Discharge: 2012-06-15 | Disposition: A | Discharge status: Hospice | Payer: Self-pay | Source: Ambulatory Visit | Attending: Obstetrics & Gynecology | Admitting: Obstetrics & Gynecology

## 2012-06-15 ENCOUNTER — Encounter (HOSPITAL_COMMUNITY): Payer: Self-pay | Admitting: *Deleted

## 2012-06-15 DIAGNOSIS — R350 Frequency of micturition: Secondary | ICD-10-CM | POA: Insufficient documentation

## 2012-06-15 DIAGNOSIS — B9689 Other specified bacterial agents as the cause of diseases classified elsewhere: Secondary | ICD-10-CM | POA: Insufficient documentation

## 2012-06-15 DIAGNOSIS — N760 Acute vaginitis: Secondary | ICD-10-CM | POA: Insufficient documentation

## 2012-06-15 DIAGNOSIS — R109 Unspecified abdominal pain: Secondary | ICD-10-CM | POA: Insufficient documentation

## 2012-06-15 DIAGNOSIS — A499 Bacterial infection, unspecified: Secondary | ICD-10-CM | POA: Insufficient documentation

## 2012-06-15 LAB — URINALYSIS, ROUTINE W REFLEX MICROSCOPIC
Bilirubin Urine: NEGATIVE
Glucose, UA: NEGATIVE mg/dL
Hgb urine dipstick: NEGATIVE
Ketones, ur: NEGATIVE mg/dL
Leukocytes, UA: NEGATIVE
Nitrite: NEGATIVE
Protein, ur: NEGATIVE mg/dL
Specific Gravity, Urine: 1.015 (ref 1.005–1.030)
Urobilinogen, UA: 0.2 mg/dL (ref 0.0–1.0)
pH: 6 (ref 5.0–8.0)

## 2012-06-15 LAB — WET PREP, GENITAL
Trich, Wet Prep: NONE SEEN
Yeast Wet Prep HPF POC: NONE SEEN

## 2012-06-15 LAB — RPR: RPR Ser Ql: NONREACTIVE

## 2012-06-15 LAB — POCT PREGNANCY, URINE: Preg Test, Ur: NEGATIVE

## 2012-06-15 MED ORDER — METRONIDAZOLE 500 MG PO TABS: 500.0000 mg | ORAL_TABLET | Freq: Two times a day (BID) | ORAL | Status: DC

## 2012-06-15 NOTE — MAU Provider Note (Signed)
Attestation of Attending Supervision of Advanced Practitioner (CNM/NP): Evaluation and management procedures were performed by the Advanced Practitioner under my supervision and collaboration.  I have reviewed the Advanced Practitioner's note and chart, and I agree with the management and plan.  HARRAWAY-SMITH, Adrieanna Boteler 6:36 AM     

## 2012-06-15 NOTE — MAU Note (Signed)
Pt states she has a low abd pain when she urinates-has urgency with urination

## 2012-06-15 NOTE — MAU Provider Note (Signed)
History     CSN: [id number]  Arrival date and time: 06/15/12 2952   First Provider Initiated Contact with Patient 06/15/12 0048      Chief Complaint  Patient presents with  . Abdominal Pain   HPI This is a 27 y.o. female who presents with c/o urinary frequency. Also wants to be tested for STDs "just to be sure".  States has some abdominal pains that come and go, shooting from lower abdomen to epigastric area.   RN Note: Pt states she has a low abd pain when she urinates-has urgency with urination  OB History    Grav Para Term Preterm Abortions TAB SAB Ect Mult Living   2 2 2  0 0 0 0 0 0 2      Past Medical History  Diagnosis Date  . Trichomonas   . Abnormal Pap smear   . Normal pregnancy 10/04/2011  . SVD (spontaneous vaginal delivery) 10/04/2011    Past Surgical History  Procedure Date  . Leep     Family History  Problem Relation Age of Onset  . Anesthesia problems Neg Hx   . Hypotension Neg Hx   . Malignant hyperthermia Neg Hx   . Pseudochol deficiency Neg Hx   . Hypertension Mother   . Hypertension Father   . Diabetes Sister     History  Substance Use Topics  . Smoking status: Never Smoker   . Smokeless tobacco: Never Used  . Alcohol Use: No    Allergies: No Known Allergies  Prescriptions prior to admission  Medication Sig Dispense Refill  . Prenatal Vit-Fe Fumarate-FA (PRENATAL MULTIVITAMIN) TABS Take 1 tablet by mouth daily.  30 tablet  12  . ibuprofen (ADVIL,MOTRIN) 800 MG tablet Take 800 mg by mouth daily as needed.      . norgestimate-ethinyl estradiol (ORTHO-CYCLEN,SPRINTEC,PREVIFEM) 0.25-35 MG-MCG tablet Take 1 tablet by mouth daily. Twice a day until bleeding stops, than once per day.  1 Package  3  . ondansetron (ZOFRAN) 4 MG tablet Take 1 tablet (4 mg total) by mouth every 6 (six) hours. For n/v  8 tablet  0    Review of Systems  Constitutional: Negative for fever, chills and malaise/fatigue.  Gastrointestinal: Negative for heartburn,  nausea, vomiting, abdominal pain, diarrhea and constipation.  Genitourinary: Positive for frequency. Negative for dysuria and hematuria.  Musculoskeletal: Negative for myalgias.  Neurological: Negative for weakness.   Physical Exam   Blood pressure 135/61, pulse 87, temperature 98.1 F (36.7 C), resp. rate 20, height 5\' 5"  (1.651 m), weight 126.724 kg (279 lb 6 oz), last menstrual period 05/07/2012, SpO2 100.00%.  Physical Exam  Constitutional: She is oriented to person, place, and time. She appears well-developed and well-nourished. No distress.  HENT:  Head: Normocephalic.  Cardiovascular: Normal rate.   Respiratory: Effort normal.  GI: Soft. She exhibits no distension and no mass. There is tenderness (slight over suprapubic). There is no rebound and no guarding.  Genitourinary: Uterus normal. Vaginal discharge (small amount thin white, + whiff) found.  Musculoskeletal: Normal range of motion.  Neurological: She is alert and oriented to person, place, and time.  Skin: Skin is warm and dry.  Psychiatric: She has a normal mood and affect.    MAU Course  Procedures  MDM Results for orders placed during the hospital encounter of 06/15/12 (from the past 24 hour(s))  URINALYSIS, ROUTINE W REFLEX MICROSCOPIC     Status: Normal   Collection Time   06/15/12 12:21 AM  Component Value Range   Color, Urine YELLOW  YELLOW   APPearance CLEAR  CLEAR   Specific Gravity, Urine 1.015  1.005 - 1.030   pH 6.0  5.0 - 8.0   Glucose, UA NEGATIVE  NEGATIVE mg/dL   Hgb urine dipstick NEGATIVE  NEGATIVE   Bilirubin Urine NEGATIVE  NEGATIVE   Ketones, ur NEGATIVE  NEGATIVE mg/dL   Protein, ur NEGATIVE  NEGATIVE mg/dL   Urobilinogen, UA 0.2  0.0 - 1.0 mg/dL   Nitrite NEGATIVE  NEGATIVE   Leukocytes, UA NEGATIVE  NEGATIVE  POCT PREGNANCY, URINE     Status: Normal   Collection Time   06/15/12 12:43 AM      Component Value Range   Preg Test, Ur NEGATIVE  NEGATIVE  WET PREP, GENITAL     Status:  Abnormal   Collection Time   06/15/12  1:45 AM      Component Value Range   Yeast Wet Prep HPF POC NONE SEEN  NONE SEEN   Trich, Wet Prep NONE SEEN  NONE SEEN   Clue Cells Wet Prep HPF POC FEW (*) NONE SEEN   WBC, Wet Prep HPF POC FEW (*) NONE SEEN     Assessment and Plan  A:  Urinary Frequency      No evidence of UTI      Bacterial Vaginosis  P:  Discussed findings.       Rx Flagyl for BV       Discussed abdominal pain may be GI related. Suggest daily Activia yogurt and fiber       Refer back to family doctor for further evaluation  [facility] 06/15/2012, 2:23 AM

## 2012-06-16 LAB — GC/CHLAMYDIA PROBE AMP
CT Probe RNA: NEGATIVE
GC Probe RNA: NEGATIVE

## 2012-07-27 ENCOUNTER — Encounter (HOSPITAL_COMMUNITY): Payer: Self-pay

## 2012-07-27 ENCOUNTER — Inpatient Hospital Stay (HOSPITAL_COMMUNITY)
Admission: AD | Admit: 2012-07-27 | Discharge: 2012-07-27 | Disposition: A | Discharge status: Home / Self Care | Payer: BC Managed Care – PPO | Source: Ambulatory Visit | Attending: Obstetrics and Gynecology | Admitting: Obstetrics and Gynecology

## 2012-07-27 DIAGNOSIS — Z3202 Encounter for pregnancy test, result negative: Secondary | ICD-10-CM | POA: Insufficient documentation

## 2012-07-27 DIAGNOSIS — R109 Unspecified abdominal pain: Secondary | ICD-10-CM

## 2012-07-27 DIAGNOSIS — N912 Amenorrhea, unspecified: Secondary | ICD-10-CM | POA: Insufficient documentation

## 2012-07-27 DIAGNOSIS — N911 Secondary amenorrhea: Secondary | ICD-10-CM

## 2012-07-27 LAB — URINALYSIS, ROUTINE W REFLEX MICROSCOPIC
Bilirubin Urine: NEGATIVE
Glucose, UA: NEGATIVE mg/dL
Hgb urine dipstick: NEGATIVE
Ketones, ur: NEGATIVE mg/dL
Nitrite: NEGATIVE
Protein, ur: NEGATIVE mg/dL
Specific Gravity, Urine: 1.025 (ref 1.005–1.030)
Urobilinogen, UA: 0.2 mg/dL (ref 0.0–1.0)
pH: 6 (ref 5.0–8.0)

## 2012-07-27 LAB — HCG, QUANTITATIVE, PREGNANCY: hCG, Beta Chain, Quant, S: <1 m[IU]/mL (ref <5)

## 2012-07-27 LAB — URINE MICROSCOPIC-ADD ON

## 2012-07-27 LAB — POCT PREGNANCY, URINE: Preg Test, Ur: NEGATIVE

## 2012-07-27 NOTE — MAU Note (Signed)
Had positive pregnancy test last week at [facility]. Denies vaginal bleeding. Abdominal cramping since yesterday. Denies vaginal discharge.

## 2012-07-27 NOTE — MAU Provider Note (Signed)
Chief Complaint: Abdominal Cramping   First Provider Initiated Contact with Patient 07/27/12 0100     SUBJECTIVE HPI: Deanna [name] is a 27 y.o. G2P2002 at Unknown by LMP who presents with pos UPT at [facility] 1 week ago per pt and mild cramping x 1 day. None now. States she has [place] scheduled 07/28/12. Denies VB, vaginal discharge, urinary complaints, GI complaints. Neg UPT and GC/CT 06/15/12. Patient's last menstrual period was 05/07/2012.   Past Medical History  Diagnosis Date  . Trichomonas   . Abnormal Pap smear   . Normal pregnancy 10/04/2011  . SVD (spontaneous vaginal delivery) 10/04/2011   OB History   Grav Para Term Preterm Abortions TAB SAB Ect Mult Living   2 2 2  0 0 0 0 0 0 2     # Outc Date GA Lbr Len/2nd Wgt Sex Del Anes PTL Lv   1 TRM 5/13 [redacted]w[redacted]d 33:40 / 00:22 3.35kg(7lb6.2oz) F SVD EPI  Yes   2 TRM      SVD   Yes     Past Surgical History  Procedure Laterality Date  . Leep     History   Social History  . Marital Status: Married    Spouse Name: N/A    Number of Children: N/A  . Years of Education: N/A   Occupational History  . Not on file.   Social History Main Topics  . Smoking status: Never Smoker   . Smokeless tobacco: Never Used  . Alcohol Use: No  . Drug Use: No  . Sexually Active: Yes    Birth Control/ Protection: None   Other Topics Concern  . Not on file   Social History Narrative  . No narrative on file   No current facility-administered medications on file prior to encounter.   Current Outpatient Prescriptions on File Prior to Encounter  Medication Sig Dispense Refill  . Prenatal Vit-Fe Fumarate-FA (PRENATAL MULTIVITAMIN) TABS Take 1 tablet by mouth daily.  30 tablet  12   No Known Allergies  ROS: Pertinent items in HPI  OBJECTIVE Blood pressure 120/53, pulse 90, temperature 98.1 F (36.7 C), temperature source Oral, resp. rate 16, height 5\' 5"  (1.651 m), weight 127.733 kg (281 lb 9.6 oz), last menstrual period 05/07/2012,  unknown if currently breastfeeding. GENERAL: Well-developed, well-nourished female in no acute distress.  HEENT: Normocephalic HEART: normal rate RESP: normal effort ABDOMEN: Soft, non-tender, obese. Fundus not palpable. BACK: No CVAT EXTREMITIES: Nontender, no edema NEURO: Alert and oriented SPECULUM EXAM: Deferred  LAB RESULTS Results for orders placed during the hospital encounter of 07/27/12 (from the past 24 hour(s))  URINALYSIS, ROUTINE W REFLEX MICROSCOPIC     Status: Abnormal   Collection Time    07/27/12 12:28 AM      Result Value Range   Color, Urine YELLOW  YELLOW   APPearance HAZY (*) CLEAR   Specific Gravity, Urine 1.025  1.005 - 1.030   pH 6.0  5.0 - 8.0   Glucose, UA NEGATIVE  NEGATIVE mg/dL   Hgb urine dipstick NEGATIVE  NEGATIVE   Bilirubin Urine NEGATIVE  NEGATIVE   Ketones, ur NEGATIVE  NEGATIVE mg/dL   Protein, ur NEGATIVE  NEGATIVE mg/dL   Urobilinogen, UA 0.2  0.0 - 1.0 mg/dL   Nitrite NEGATIVE  NEGATIVE   Leukocytes, UA SMALL (*) NEGATIVE  URINE MICROSCOPIC-ADD ON     Status: Abnormal   Collection Time    07/27/12 12:28 AM      Result Value Range  Squamous Epithelial / LPF FEW (*) RARE   WBC, UA 0-2  <3 WBC/hpf   RBC / HPF 0-2  <3 RBC/hpf   Urine-Other MUCOUS PRESENT    POCT PREGNANCY, URINE     Status: None   Collection Time    07/27/12 12:38 AM      Result Value Range   Preg Test, Ur NEGATIVE  NEGATIVE  HCG, QUANTITATIVE, PREGNANCY     Status: None   Collection Time    07/27/12  1:00 AM      Result Value Range   hCG, Beta Chain, Quant, S <1  <5 mIU/mL    IMAGING No results found.  MAU COURSE  ASSESSMENT 1. Secondary amenorrhea   2. Abdominal cramping, resolved    PLAN Discharge home. Pt anxious to leave due to ride being here. Did not h give CNM opportunity to discuss possible causes of secondary amenorrhea and possible scenarios for pos UPT in office and neg tests in MAU.  Follow-up Information   Follow up with MEISINGER,TODD D,  MD. (As scheduled)    Contact information:   [address] [place] [address] [phone]        Medication List    STOP taking these medications       ibuprofen 800 MG tablet  Commonly known as:  ADVIL,MOTRIN     metroNIDAZOLE 500 MG tablet  Commonly known as:  FLAGYL     norgestimate-ethinyl estradiol 0.25-35 MG-MCG tablet  Commonly known as:  ORTHO-CYCLEN,SPRINTEC,PREVIFEM     ondansetron 4 MG tablet  Commonly known as:  ZOFRAN      TAKE these medications       prenatal multivitamin Tabs  Take 1 tablet by mouth daily.       [place], CNM 07/27/2012  2:22 AM

## 2012-09-18 ENCOUNTER — Emergency Department (HOSPITAL_COMMUNITY)
Admission: EM | Admit: 2012-09-18 | Discharge: 2012-09-18 | Disposition: A | Discharge status: Home / Self Care | Payer: Worker's Compensation | Attending: Emergency Medicine | Admitting: Emergency Medicine

## 2012-09-18 ENCOUNTER — Emergency Department (HOSPITAL_COMMUNITY): Payer: Worker's Compensation

## 2012-09-18 ENCOUNTER — Encounter (HOSPITAL_COMMUNITY): Payer: Self-pay | Admitting: Emergency Medicine

## 2012-09-18 DIAGNOSIS — S63502A Unspecified sprain of left wrist, initial encounter: Secondary | ICD-10-CM

## 2012-09-18 DIAGNOSIS — Y9301 Activity, walking, marching and hiking: Secondary | ICD-10-CM | POA: Insufficient documentation

## 2012-09-18 DIAGNOSIS — Z8742 Personal history of other diseases of the female genital tract: Secondary | ICD-10-CM | POA: Insufficient documentation

## 2012-09-18 DIAGNOSIS — Z8619 Personal history of other infectious and parasitic diseases: Secondary | ICD-10-CM | POA: Insufficient documentation

## 2012-09-18 DIAGNOSIS — W19XXXA Unspecified fall, initial encounter: Secondary | ICD-10-CM | POA: Insufficient documentation

## 2012-09-18 DIAGNOSIS — Y92009 Unspecified place in unspecified non-institutional (private) residence as the place of occurrence of the external cause: Secondary | ICD-10-CM | POA: Insufficient documentation

## 2012-09-18 DIAGNOSIS — S63509A Unspecified sprain of unspecified wrist, initial encounter: Secondary | ICD-10-CM | POA: Insufficient documentation

## 2012-09-18 DIAGNOSIS — S8000XA Contusion of unspecified knee, initial encounter: Secondary | ICD-10-CM | POA: Insufficient documentation

## 2012-09-18 DIAGNOSIS — S8002XA Contusion of left knee, initial encounter: Secondary | ICD-10-CM

## 2012-09-18 MED ORDER — IBUPROFEN 600 MG PO TABS: 600.0000 mg | ORAL_TABLET | Freq: Four times a day (QID) | ORAL | Status: DC | PRN

## 2012-09-18 NOTE — ED Notes (Addendum)
Patient denies any treatment of medications PTA. Patient given ice pack for comfort.

## 2012-09-18 NOTE — ED Provider Notes (Addendum)
History     CSN: [id number]  Arrival date & time 09/18/12  0116   First MD Initiated Contact with Patient 09/18/12 0235      Chief Complaint  Patient presents with  . Knee Pain    (Consider location/radiation/quality/duration/timing/severity/associated sxs/prior treatment) HPI Comments: Patient with a history of her left knee "giving out."  Was at work.  Tonight.  She turned walked away from the patient.  When her knee popped out of place causing her to fall to her knee and tears up with her outstretched left hand.  Patient now presents to the emergency department, with left knee.  Pain with full range of motion, and left wrist pain, with full range of motion.  She, states, that her knee gives out periodically, and its been doing this for "a while."  She has never had a investigated by orthopedics  Patient is a 27 y.o. female presenting with knee pain. The history is provided by the patient.  Knee Pain Location:  Knee Injury: yes   Mechanism of injury: fall   Fall:    Fall occurred:  Walking   Impact surface:  Hard floor   Point of impact:  Outstretched arms and knees   Entrapped after fall: no   Knee location:  L knee Pain details:    Quality:  Aching   Radiates to:  Does not radiate   Severity:  Moderate   Onset quality:  Sudden   Timing:  Constant Associated symptoms: no fever     Past Medical History  Diagnosis Date  . Trichomonas   . Abnormal Pap smear   . Normal pregnancy 10/04/2011  . SVD (spontaneous vaginal delivery) 10/04/2011    Past Surgical History  Procedure Laterality Date  . Leep      Family History  Problem Relation Age of Onset  . Anesthesia problems Neg Hx   . Hypotension Neg Hx   . Malignant hyperthermia Neg Hx   . Pseudochol deficiency Neg Hx   . Hypertension Mother   . Hypertension Father   . Diabetes Sister     History  Substance Use Topics  . Smoking status: Never Smoker   . Smokeless tobacco: Never Used  . Alcohol Use: Yes   Comment: occassionally    OB History   Grav Para Term Preterm Abortions TAB SAB Ect Mult Living   2 2 2  0 0 0 0 0 0 2      Review of Systems  Constitutional: Negative for fever.  Cardiovascular: Negative for leg swelling.  Musculoskeletal: Negative for joint swelling.  Skin: Negative for wound.  Neurological: Negative for numbness.  All other systems reviewed and are negative.    Allergies  Review of patient's allergies indicates no known allergies.  Home Medications   Current Outpatient Rx  Name  Route  Sig  Dispense  Refill  . Prenatal Vit-Fe Fumarate-FA (PRENATAL MULTIVITAMIN) TABS   Oral   Take 1 tablet by mouth daily.   30 tablet   12   . ibuprofen (ADVIL,MOTRIN) 600 MG tablet   Oral   Take 1 tablet (600 mg total) by mouth every 6 (six) hours as needed for pain.   30 tablet   0     BP 121/71  Pulse 80  Temp(Src) 98.3 F (36.8 C) (Oral)  Resp 20  SpO2 99%  LMP 08/19/2012  Physical Exam  Nursing note and vitals reviewed. Constitutional: She is oriented to person, place, and time. She appears well-developed and well-nourished.  Obese  HENT:  Head: Normocephalic and atraumatic.  Eyes: Pupils are equal, round, and reactive to light.  Neck: Normal range of motion.  Cardiovascular: Normal rate and regular rhythm.   Pulmonary/Chest: Effort normal and breath sounds normal.  Musculoskeletal: She exhibits tenderness. She exhibits no edema.       Left wrist: She exhibits tenderness. She exhibits normal range of motion, no bony tenderness, no swelling, no effusion, no crepitus and no deformity.       Left knee: She exhibits normal range of motion, no swelling, no effusion, no ecchymosis, no deformity, no laceration and no erythema. Tenderness found.  Neurological: She is alert and oriented to person, place, and time.  Skin: Skin is warm and dry.  Psychiatric: She has a normal mood and affect. Her behavior is normal.    ED Course  Procedures (including  critical care time)  Labs Reviewed - No data to display Dg Wrist Complete Left  09/18/2012  *RADIOLOGY REPORT*  Clinical Data: Ulnar sided left wrist pain after fall on outstretched hand.  LEFT WRIST - COMPLETE 3+ VIEW  Comparison: None.  Findings: The left wrist appears intact. No evidence of acute fracture or subluxation.  No focal bone lesions.  Bone matrix and cortex appear intact.  No abnormal radiopaque densities in the soft tissues.  IMPRESSION: No acute bony abnormalities demonstrated.   Original Report Authenticated By: Burman Nieves, M.D.      1. Fall, initial encounter   2. Knee contusion, left, initial encounter   3. Wrist sprain, left, initial encounter       MDM  Have asked that in the sleeve be placed on the patient's left knee to give her extra support.  We'll x-ray the wrist, although there is no apparent injury, we'll refer her to orthopedics for followup         Arman Filter, NP 09/18/12 0403  Arman Filter, NP 09/18/12 2036

## 2012-09-18 NOTE — ED Notes (Signed)
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

## 2012-09-18 NOTE — ED Notes (Signed)
Patient states she fell at work onto her left knee and her left wrist "it bent back". Patient states she has difficulty with her knee "popping out" and can sometimes catch it and stop it. Patient states it popped out of place, but now feels like it's back in place. Patient states it is throbbing at this time. Patient walked to treatment room, in NAD.

## 2012-09-18 NOTE — ED Provider Notes (Signed)
Medical screening examination/treatment/procedure(s) were performed by non-physician practitioner and as supervising physician I was immediately available for consultation/collaboration.  John-Adam Riggins Cisek, M.D.  John-Adam Elmor Kost, MD 09/18/12 0741 

## 2012-09-18 NOTE — ED Notes (Signed)
Patient returned from XR. 

## 2012-09-20 NOTE — ED Provider Notes (Signed)
Medical screening examination/treatment/procedure(s) were performed by non-physician practitioner and as supervising physician I was immediately available for consultation/collaboration.  Jones Skene, M.D.   Jones Skene, MD 09/20/12 [phone]

## 2012-12-28 ENCOUNTER — Encounter (HOSPITAL_COMMUNITY): Payer: Self-pay | Admitting: Nurse Practitioner

## 2012-12-28 ENCOUNTER — Emergency Department (HOSPITAL_COMMUNITY): Payer: BC Managed Care – PPO

## 2012-12-28 ENCOUNTER — Emergency Department (HOSPITAL_COMMUNITY)
Admission: EM | Admit: 2012-12-28 | Discharge: 2012-12-28 | Disposition: A | Discharge status: Home / Self Care | Payer: BC Managed Care – PPO | Attending: Emergency Medicine | Admitting: Emergency Medicine

## 2012-12-28 DIAGNOSIS — N390 Urinary tract infection, site not specified: Secondary | ICD-10-CM | POA: Insufficient documentation

## 2012-12-28 DIAGNOSIS — Z79899 Other long term (current) drug therapy: Secondary | ICD-10-CM | POA: Insufficient documentation

## 2012-12-28 DIAGNOSIS — Z8742 Personal history of other diseases of the female genital tract: Secondary | ICD-10-CM | POA: Insufficient documentation

## 2012-12-28 DIAGNOSIS — M546 Pain in thoracic spine: Secondary | ICD-10-CM | POA: Insufficient documentation

## 2012-12-28 DIAGNOSIS — M549 Dorsalgia, unspecified: Secondary | ICD-10-CM

## 2012-12-28 DIAGNOSIS — Z3202 Encounter for pregnancy test, result negative: Secondary | ICD-10-CM | POA: Insufficient documentation

## 2012-12-28 DIAGNOSIS — Z2089 Contact with and (suspected) exposure to other communicable diseases: Secondary | ICD-10-CM | POA: Insufficient documentation

## 2012-12-28 LAB — URINALYSIS, ROUTINE W REFLEX MICROSCOPIC
Glucose, UA: NEGATIVE mg/dL
Hgb urine dipstick: NEGATIVE
Ketones, ur: 15 mg/dL — AB
Nitrite: NEGATIVE
Protein, ur: NEGATIVE mg/dL
Specific Gravity, Urine: 1.027 (ref 1.005–1.030)
Urobilinogen, UA: 1 mg/dL (ref 0.0–1.0)
pH: 7 (ref 5.0–8.0)

## 2012-12-28 LAB — URINE MICROSCOPIC-ADD ON

## 2012-12-28 LAB — POCT PREGNANCY, URINE: Preg Test, Ur: NEGATIVE

## 2012-12-28 MED ORDER — SULFAMETHOXAZOLE-TRIMETHOPRIM 800-160 MG PO TABS: 1.0000 | ORAL_TABLET | Freq: Two times a day (BID) | ORAL | Status: DC

## 2012-12-28 MED ORDER — TRAMADOL HCL 50 MG PO TABS: 50.0000 mg | ORAL_TABLET | Freq: Four times a day (QID) | ORAL | Status: DC | PRN

## 2012-12-28 MED ORDER — NAPROXEN 500 MG PO TABS: 500.0000 mg | ORAL_TABLET | Freq: Two times a day (BID) | ORAL | Status: DC

## 2012-12-28 MED ORDER — OXYCODONE-ACETAMINOPHEN 5-325 MG PO TABS
1.0000 | ORAL_TABLET | Freq: Once | ORAL | Status: AC
  Administered 2012-12-28: 1 via ORAL
  Filled 2012-12-28: qty 1

## 2012-12-28 MED ORDER — CYCLOBENZAPRINE HCL 10 MG PO TABS: 10.0000 mg | ORAL_TABLET | Freq: Two times a day (BID) | ORAL | Status: DC | PRN

## 2012-12-28 MED ORDER — DIAZEPAM 5 MG/ML IJ SOLN
10.0000 mg | Freq: Once | INTRAMUSCULAR | Status: AC
  Administered 2012-12-28: 10 mg via INTRAMUSCULAR
  Filled 2012-12-28: qty 2

## 2012-12-28 NOTE — ED Notes (Signed)
Pt returned from radiology.

## 2012-12-28 NOTE — ED Provider Notes (Signed)
CSN: [id number]     Arrival date & time 12/28/12  1323 History     First MD Initiated Contact with Patient 12/28/12 1347     Chief Complaint  Patient presents with  . Back Pain   (Consider location/radiation/quality/duration/timing/severity/associated sxs/prior Treatment) The history is provided by the patient. No language interpreter was used.  Deanna [name] is a 27 y/o F presenting to the ED with back pain that started yesterday, was resolved, and started back up again today at approximately 11:00Am-12:00PM. Patient reported that the back pain is localized to the left scapula region with radiation down the back, described as a constant cramping, tightness sensation. Patient reported that the pain is worse when she walks or tries to move. Stated that sitting still she has discomfort. Reported that she has not tried anything for her discomfort. Denied fall, injury, numbness tingling, weakness, fever, chills, chest pain, shortness of breath, difficulty breathing, urinary and bowel incontinence, dysuria, hematuria. PCP none   Past Medical History  Diagnosis Date  . Trichomonas   . Abnormal Pap smear   . Normal pregnancy 10/04/2011  . SVD (spontaneous vaginal delivery) 10/04/2011   Past Surgical History  Procedure Laterality Date  . Leep     Family History  Problem Relation Age of Onset  . Anesthesia problems Neg Hx   . Hypotension Neg Hx   . Malignant hyperthermia Neg Hx   . Pseudochol deficiency Neg Hx   . Hypertension Mother   . Hypertension Father   . Diabetes Sister    History  Substance Use Topics  . Smoking status: Never Smoker   . Smokeless tobacco: Never Used  . Alcohol Use: Yes     Comment: occassionally   OB History   Grav Para Term Preterm Abortions TAB SAB Ect Mult Living   2 2 2  0 0 0 0 0 0 2     Review of Systems  Constitutional: Negative for fever and chills.  HENT: Negative for neck pain and neck stiffness.   Eyes: Negative for visual disturbance.    Respiratory: Negative for cough, chest tightness and shortness of breath.   Gastrointestinal: Negative for nausea, vomiting, abdominal pain and diarrhea.  Genitourinary: Negative for dysuria, hematuria, decreased urine volume and difficulty urinating.  Musculoskeletal: Positive for back pain.  Neurological: Negative for weakness and numbness.  All other systems reviewed and are negative.    Allergies  Review of patient's allergies indicates no known allergies.  Home Medications   Current Outpatient Rx  Name  Route  Sig  Dispense  Refill  . ibuprofen (ADVIL,MOTRIN) 600 MG tablet   Oral   Take 1 tablet (600 mg total) by mouth every 6 (six) hours as needed for pain.   30 tablet   0   . Prenatal Vit-Fe Fumarate-FA (PRENATAL MULTIVITAMIN) TABS   Oral   Take 1 tablet by mouth daily.   30 tablet   12   . cyclobenzaprine (FLEXERIL) 10 MG tablet   Oral   Take 1 tablet (10 mg total) by mouth 2 (two) times daily as needed for muscle spasms.   20 tablet   0   . naproxen (NAPROSYN) 500 MG tablet   Oral   Take 1 tablet (500 mg total) by mouth 2 (two) times daily.   30 tablet   0   . sulfamethoxazole-trimethoprim (BACTRIM DS,SEPTRA DS) 800-160 MG per tablet   Oral   Take 1 tablet by mouth 2 (two) times daily. One po bid x  7 days   14 tablet   0   . traMADol (ULTRAM) 50 MG tablet   Oral   Take 1 tablet (50 mg total) by mouth every 6 (six) hours as needed for pain.   15 tablet   0    BP 145/98  Pulse 88  Temp(Src) 98.1 F (36.7 C) (Oral)  Resp 22  Ht 5\' 5"  (1.651 m)  Wt 240 lb (108.863 kg)  BMI 39.94 kg/m2  SpO2 99%  LMP 11/29/2012 Physical Exam  Nursing note and vitals reviewed. Constitutional: She is oriented to person, place, and time. She appears well-developed and well-nourished. No distress.  HENT:  Head: Normocephalic and atraumatic.  Eyes: Conjunctivae and EOM are normal. Pupils are equal, round, and reactive to light. Right eye exhibits no discharge. Left  eye exhibits no discharge.  Neck: Normal range of motion. Neck supple.  Negative neck stiffness Negative nuchal rigidity Negative mid-cervical spine tenderness upon palpation   Cardiovascular: Normal rate, regular rhythm and normal heart sounds.  Exam reveals no friction rub.   No murmur heard. Pulses:      Radial pulses are 2+ on the right side, and 2+ on the left side.       Dorsalis pedis pulses are 2+ on the right side, and 2+ on the left side.  Pulmonary/Chest: Effort normal and breath sounds normal. No respiratory distress. She has no wheezes. She has no rales.  Musculoskeletal: Normal range of motion. She exhibits tenderness.       Back:  Muscular tenderness upon palpation to paraspinal region, localized more so to the left side  Lymphadenopathy:    She has no cervical adenopathy.  Neurological: She is alert and oriented to person, place, and time. She exhibits normal muscle tone. Coordination normal.  Strength 5+/5+ to upper and lower extremities bilaterally Sensation intact with differentiation to sharp and dull touch to upper and lower extremities Gait straight and proper, discomfort noted - negative sway  Skin: Skin is warm and dry. No rash noted. She is not diaphoretic. No erythema.  Psychiatric: She has a normal mood and affect. Her behavior is normal. Thought content normal.    ED Course   Procedures (including critical care time)  3:30PM Patient seen and assessed by Dr. Jodi Mourning. Dr. Abran Duke recommended thoracic spine xray and control of patient's pain. Recommended if normal for discharge and follow-up.   Medications  diazepam (VALIUM) injection 10 mg (10 mg Intramuscular Given 12/28/12 1522)  oxyCODONE-acetaminophen (PERCOCET/ROXICET) 5-325 MG per tablet 1 tablet (1 tablet Oral Given 12/28/12 1522)    Labs Reviewed  URINALYSIS, ROUTINE W REFLEX MICROSCOPIC - Abnormal; Notable for the following:    APPearance HAZY (*)    Bilirubin Urine SMALL (*)    Ketones, ur 15  (*)    Leukocytes, UA TRACE (*)    All other components within normal limits  URINE MICROSCOPIC-ADD ON - Abnormal; Notable for the following:    Squamous Epithelial / LPF MANY (*)    Bacteria, UA MANY (*)    All other components within normal limits  URINE CULTURE  POCT PREGNANCY, URINE   Dg Thoracic Spine 4v  12/28/2012   *RADIOLOGY REPORT*  Clinical Data: Upper back pain  THORACIC SPINE - 4+ VIEW  Comparison: None.  Findings: Vertebral body height is well-maintained.  The pedicles are within normal limits.  No paraspinal mass lesion is noted.  IMPRESSION: No acute abnormalities seen.   Original Report Authenticated By: Alcide Clever, M.D.  1. UTI (urinary tract infection)   2. Back pain     MDM  Patient presenting to the ED with back pain that started yesterday, resolved, and returned today. Pain upon palpation to the paraspinal region of the back, mainly to the left side. Gait normal and balanced, mild discomfort noted when walking. Sensation intact. Pulses palpable. Strength 5+/5+. Imaging negative for acute injuries. Doubt cauda equina - negative injury/fall, negative urinary bowel and bladder incontinence. Suspicion to be musculoskeletal in nature, definitive etiology unknown. Urine reported mild UTI, negative pyelonephritis - patient started on antibiotics. Patient stable, afebrile. Discharged patient with pain medication - discussed course, precautions and disposal technique. Referred patient to orthopedics. Discussed with patient to rest and stay hydrated. Discussed with patient to continue to monitor symptoms and if symptoms are to worsen or change to report back to the ED -strict return instructions given. Patient agreed to plan of care, understood, all questions answered.    Raymon Mutton, PA-C 12/28/12 1819

## 2012-12-28 NOTE — ED Notes (Signed)
C/o lower back pain yesterday that resolved, then returned this am and was severe. Denies any injuries or history of back pain. Denies bowel/bladder changes.

## 2012-12-28 NOTE — ED Notes (Signed)
PA at bedside for evaluation

## 2012-12-29 LAB — URINE CULTURE: Colony Count: >=100000

## 2012-12-29 NOTE — ED Provider Notes (Signed)
Medical screening examination/treatment/procedure(s) were conducted as a shared visit with non-physician practitioner(s) or resident  and myself.  I personally evaluated the patient during the encounter and agree with the findings and plan unless otherwise indicated.  MSK back pain.  Mild bone tenderness.  Xray no acute findings.  Normal neuro exam.  DC discussed.   Enid Skeens, MD 12/29/12 [phone]

## 2013-01-28 ENCOUNTER — Inpatient Hospital Stay (HOSPITAL_COMMUNITY)
Admission: AD | Admit: 2013-01-28 | Discharge: 2013-01-28 | Disposition: A | Discharge status: Home / Self Care | Payer: BC Managed Care – PPO | Source: Ambulatory Visit | Attending: Obstetrics and Gynecology | Admitting: Obstetrics and Gynecology

## 2013-01-28 DIAGNOSIS — Z202 Contact with and (suspected) exposure to infections with a predominantly sexual mode of transmission: Secondary | ICD-10-CM | POA: Insufficient documentation

## 2013-01-28 DIAGNOSIS — R109 Unspecified abdominal pain: Secondary | ICD-10-CM | POA: Insufficient documentation

## 2013-01-28 LAB — URINALYSIS, ROUTINE W REFLEX MICROSCOPIC
Bilirubin Urine: NEGATIVE
Glucose, UA: NEGATIVE mg/dL
Ketones, ur: 15 mg/dL — AB
Leukocytes, UA: NEGATIVE
Nitrite: NEGATIVE
Protein, ur: NEGATIVE mg/dL
Specific Gravity, Urine: 1.025 (ref 1.005–1.030)
Urobilinogen, UA: 1 mg/dL (ref 0.0–1.0)
pH: 6 (ref 5.0–8.0)

## 2013-01-28 LAB — URINE MICROSCOPIC-ADD ON

## 2013-01-28 LAB — POCT PREGNANCY, URINE: Preg Test, Ur: NEGATIVE

## 2013-01-28 NOTE — MAU Note (Signed)
Patient states her partner told her he has had a positive CT and she wants to be tested. Has mild abdominal pain. Denies bleeding or discharge.

## 2013-01-28 NOTE — MAU Note (Signed)
Patient is not in the lobby when called to a room.  

## 2013-01-28 NOTE — MAU Note (Signed)
Patient is not in the lobby when called to triage.  

## 2013-02-02 ENCOUNTER — Inpatient Hospital Stay (HOSPITAL_COMMUNITY)
Admission: AD | Admit: 2013-02-02 | Discharge: 2013-02-02 | Disposition: A | Discharge status: Home / Self Care | Payer: BC Managed Care – PPO | Source: Ambulatory Visit | Attending: Obstetrics and Gynecology | Admitting: Obstetrics and Gynecology

## 2013-02-02 ENCOUNTER — Encounter (HOSPITAL_COMMUNITY): Payer: Self-pay | Admitting: *Deleted

## 2013-02-02 DIAGNOSIS — A499 Bacterial infection, unspecified: Secondary | ICD-10-CM | POA: Insufficient documentation

## 2013-02-02 DIAGNOSIS — N760 Acute vaginitis: Secondary | ICD-10-CM | POA: Insufficient documentation

## 2013-02-02 DIAGNOSIS — Z202 Contact with and (suspected) exposure to infections with a predominantly sexual mode of transmission: Secondary | ICD-10-CM | POA: Insufficient documentation

## 2013-02-02 DIAGNOSIS — B9689 Other specified bacterial agents as the cause of diseases classified elsewhere: Secondary | ICD-10-CM | POA: Insufficient documentation

## 2013-02-02 DIAGNOSIS — N949 Unspecified condition associated with female genital organs and menstrual cycle: Secondary | ICD-10-CM | POA: Insufficient documentation

## 2013-02-02 HISTORY — DX: Unspecified infectious disease: B99.9

## 2013-02-02 LAB — WET PREP, GENITAL
Trich, Wet Prep: NONE SEEN
Yeast Wet Prep HPF POC: NONE SEEN

## 2013-02-02 LAB — URINALYSIS, ROUTINE W REFLEX MICROSCOPIC
Bilirubin Urine: NEGATIVE
Glucose, UA: NEGATIVE mg/dL
Hgb urine dipstick: NEGATIVE
Ketones, ur: NEGATIVE mg/dL
Nitrite: NEGATIVE
Protein, ur: NEGATIVE mg/dL
Specific Gravity, Urine: 1.015 (ref 1.005–1.030)
Urobilinogen, UA: 2 mg/dL — ABNORMAL HIGH (ref 0.0–1.0)
pH: 6.5 (ref 5.0–8.0)

## 2013-02-02 LAB — URINE MICROSCOPIC-ADD ON

## 2013-02-02 MED ORDER — AZITHROMYCIN 250 MG PO TABS
1000.0000 mg | ORAL_TABLET | Freq: Once | ORAL | Status: AC
  Administered 2013-02-02: 1000 mg via ORAL
  Filled 2013-02-02: qty 4

## 2013-02-02 MED ORDER — METRONIDAZOLE 500 MG PO TABS: 500.0000 mg | ORAL_TABLET | Freq: Two times a day (BID) | ORAL | Status: DC

## 2013-02-02 NOTE — MAU Provider Note (Signed)
History   Deanna [name] is a 27 year old G2P2002 currently not pregnant presenting for evaluation of possible STD exposure. She states that she has a new sexual partner, who told her last week that he tested positive for chlamydia. He was treated recently and their last sexual encounter was July 24th. For 1 week, she has experienced urinary urgency, in addition to the onset of suprapubic pressure for the past 2 days. She denies any dysuria, vaginal bleeding, vaginal discharge, fevers, nausea, or vomiting. She was here for the same complaint on 9/5, however, she left prior to evaluation because of the lengthy wait.  CSN: [id number]  Arrival date and time: 02/02/13 1040   First Provider Initiated Contact with Patient 02/02/13 1148      Chief Complaint  Patient presents with  . Vaginal Discharge  . Abdominal Pain   Vaginal Discharge The patient's primary symptoms include a vaginal discharge. Associated symptoms include abdominal pain and urgency. Pertinent negatives include no chills, diarrhea, dysuria, fever, flank pain, frequency, headaches, hematuria, nausea, rash or vomiting.  Abdominal Pain Pertinent negatives include no diarrhea, dysuria, fever, frequency, headaches, hematuria, nausea or vomiting.    Past Medical History  Diagnosis Date  . Trichomonas   . Abnormal Pap smear   . Normal pregnancy 10/04/2011  . SVD (spontaneous vaginal delivery) 10/04/2011  . Infection     UTI    Past Surgical History  Procedure Laterality Date  . Leep      Family History  Problem Relation Age of Onset  . Anesthesia problems Neg Hx   . Hypotension Neg Hx   . Malignant hyperthermia Neg Hx   . Pseudochol deficiency Neg Hx   . Hypertension Mother   . Hypertension Father   . Diabetes Sister   . Diabetes Maternal Grandfather     History  Substance Use Topics  . Smoking status: Never Smoker   . Smokeless tobacco: Never Used  . Alcohol Use: Yes     Comment: occassionally    Allergies:  No Known Allergies  No prescriptions prior to admission    Review of Systems  Constitutional: Negative for fever and chills.  Gastrointestinal: Positive for abdominal pain. Negative for nausea, vomiting and diarrhea.  Genitourinary: Positive for urgency and vaginal discharge. Negative for dysuria, frequency, hematuria and flank pain.  Skin: Negative for rash.  Neurological: Negative for headaches.   Physical Exam   Blood pressure 115/71, pulse 85, temperature 98.5 F (36.9 C), temperature source Oral, resp. rate 18, last menstrual period 01/08/2013.  Physical Exam  Constitutional: She appears well-developed and well-nourished. No distress.  Cardiovascular: Normal rate, regular rhythm, normal heart sounds and intact distal pulses.   Respiratory: Effort normal and breath sounds normal.  GI: Soft. Bowel sounds are normal. There is no tenderness.  Genitourinary: There is no rash, tenderness, lesion or injury on the right labia. There is no rash, tenderness, lesion or injury on the left labia.  Speculum exam: Scant white discharge present with no blood. No masses or lesions. Closed cervical os.   Bimanual exam: No cervical motion tenderness. No masses.  Psychiatric: She has a normal mood and affect.   Results for orders placed during the hospital encounter of 02/02/13 (from the past 24 hour(s))  URINALYSIS, ROUTINE W REFLEX MICROSCOPIC     Status: Abnormal   Collection Time    02/02/13 10:50 AM      Result Value Range   Color, Urine YELLOW  YELLOW   APPearance HAZY (*) CLEAR  Specific Gravity, Urine 1.015  1.005 - 1.030   pH 6.5  5.0 - 8.0   Glucose, UA NEGATIVE  NEGATIVE mg/dL   Hgb urine dipstick NEGATIVE  NEGATIVE   Bilirubin Urine NEGATIVE  NEGATIVE   Ketones, ur NEGATIVE  NEGATIVE mg/dL   Protein, ur NEGATIVE  NEGATIVE mg/dL   Urobilinogen, UA 2.0 (*) 0.0 - 1.0 mg/dL   Nitrite NEGATIVE  NEGATIVE   Leukocytes, UA SMALL (*) NEGATIVE  URINE MICROSCOPIC-ADD ON     Status:  Abnormal   Collection Time    02/02/13 10:50 AM      Result Value Range   Squamous Epithelial / LPF FEW (*) RARE   WBC, UA 0-2  <3 WBC/hpf   Bacteria, UA FEW (*) RARE   Urine-Other MUCOUS PRESENT    WET PREP, GENITAL     Status: Abnormal   Collection Time    02/02/13 12:11 PM      Result Value Range   Yeast Wet Prep HPF POC NONE SEEN  NONE SEEN   Trich, Wet Prep NONE SEEN  NONE SEEN   Clue Cells Wet Prep HPF POC FEW (*) NONE SEEN   WBC, Wet Prep HPF POC FEW (*) NONE SEEN   MAU Course  Procedures  MDM UA Wet prep GC/Chalmydia   Assessment and Plan  Assessment: Contact to chlamydia  BV  Plan:  Plan to empirically treat with azithromycin 1000mg  PO, single dose  BV - Flagyl 500mg  BID x7days  Pending GC/chalmydia cultures - will contact patient once results return and treat accordingly, if necessary Instruct patient to return to the MAU if symptoms worsen  Patient instructed to notify all sexual partners and tell them to seek appropriate treatment at [facility] as soon as possible    Coy Saunas 02/02/2013, 11:59 AM

## 2013-02-02 NOTE — MAU Provider Note (Signed)
I have reviewed and agree with note and plan. Carolynn Serve, NP

## 2013-02-02 NOTE — MAU Note (Signed)
STD exposure

## 2013-02-03 LAB — GC/CHLAMYDIA PROBE AMP
CT Probe RNA: NEGATIVE
GC Probe RNA: NEGATIVE

## 2013-09-15 ENCOUNTER — Telehealth: Payer: Self-pay | Admitting: *Deleted

## 2013-09-15 ENCOUNTER — Ambulatory Visit: Payer: Self-pay | Admitting: Obstetrics & Gynecology

## 2013-09-15 NOTE — Telephone Encounter (Signed)
Contacted and informed of missed appointment. Pt states she forgot and will call and reschedule.

## 2013-09-27 ENCOUNTER — Other Ambulatory Visit: Payer: Self-pay

## 2013-09-29 ENCOUNTER — Other Ambulatory Visit: Payer: Self-pay

## 2013-10-12 ENCOUNTER — Encounter: Payer: Self-pay | Admitting: Advanced Practice Midwife

## 2013-10-12 ENCOUNTER — Ambulatory Visit (INDEPENDENT_AMBULATORY_CARE_PROVIDER_SITE_OTHER): Payer: BC Managed Care – PPO | Admitting: Advanced Practice Midwife

## 2013-10-12 VITALS — BP 127/88 | HR 105 | Temp 97.5°F | Ht 65.0 in | Wt 270.3 lb

## 2013-10-12 DIAGNOSIS — N898 Other specified noninflammatory disorders of vagina: Secondary | ICD-10-CM

## 2013-10-12 DIAGNOSIS — B9689 Other specified bacterial agents as the cause of diseases classified elsewhere: Secondary | ICD-10-CM

## 2013-10-12 DIAGNOSIS — Z01419 Encounter for gynecological examination (general) (routine) without abnormal findings: Secondary | ICD-10-CM

## 2013-10-12 DIAGNOSIS — A499 Bacterial infection, unspecified: Secondary | ICD-10-CM

## 2013-10-12 DIAGNOSIS — E669 Obesity, unspecified: Secondary | ICD-10-CM

## 2013-10-12 DIAGNOSIS — N760 Acute vaginitis: Secondary | ICD-10-CM | POA: Insufficient documentation

## 2013-10-12 NOTE — Progress Notes (Signed)
  Subjective:     Deanna [name] is a 28 y.o. female here for a routine exam.  Current complaints: vaginal discharge, wants to be tested for STDs and BV, has hx BV.  Personal health questionnaire reviewed: no.   Gynecologic History Patient's last menstrual period was 09/29/2013. Contraception: OCP (estrogen/progesterone) Last Pap: 2011 . Results were: normal Last mammogram: n/a.   Obstetric History OB History  Gravida Para Term Preterm AB SAB TAB Ectopic Multiple Living  2 2 2  0 0 0 0 0 0 2    # Outcome Date GA Lbr Len/2nd Weight Sex Delivery Anes PTL Lv  2 TRM 10/04/11 [id number] 33:40 / 00:22 3.35 kg (7 lb 6.2 oz) F SVD EPI  Y  1 TRM      SVD   Y       The following portions of the patient's history were reviewed and updated as appropriate: allergies, current medications, past family history, past medical history, past social history, past surgical history and problem list.  Review of Systems Pertinent items are noted in HPI.    Objective:    BP 127/88  Pulse 105  Temp(Src) 97.5 F (36.4 C)  Ht 5\' 5"  (1.651 m)  Wt 122.607 kg (270 lb 4.8 oz)  BMI 44.98 kg/m2  LMP 09/29/2013 General appearance: alert, cooperative and no distress Lungs: clear to auscultation bilaterally Breasts: normal appearance, no masses or tenderness, Inspection negative, No nipple retraction or dimpling, No nipple discharge or bleeding Heart: regular rate and rhythm, S1, S2 normal, no murmur, click, rub or gallop Abdomen: soft, non-tender; bowel sounds normal; no masses,  no organomegaly Pelvic: cervix normal in appearance, external genitalia normal, no adnexal masses or tenderness, no cervical motion tenderness, rectovaginal septum normal, uterus normal size, shape, and consistency and vagina normal with milky discharge    Assessment:    Healthy female exam.    Plan:    Contraception: OCP (estrogen/progesterone). Follow up in: 1 year. Wet prep sent. GC Chlamydia added to pap

## 2013-10-12 NOTE — Patient Instructions (Signed)

## 2013-10-13 LAB — WET PREP, GENITAL: Trich, Wet Prep: NONE SEEN

## 2013-10-17 ENCOUNTER — Other Ambulatory Visit: Payer: Self-pay | Admitting: Advanced Practice Midwife

## 2013-10-17 MED ORDER — METRONIDAZOLE 500 MG PO TABS: 500.0000 mg | ORAL_TABLET | Freq: Three times a day (TID) | ORAL | Status: DC

## 2013-10-18 ENCOUNTER — Telehealth: Payer: Self-pay

## 2013-10-18 DIAGNOSIS — B373 Candidiasis of vulva and vagina: Secondary | ICD-10-CM

## 2013-10-18 DIAGNOSIS — B3731 Acute candidiasis of vulva and vagina: Secondary | ICD-10-CM

## 2013-10-18 MED ORDER — METRONIDAZOLE 500 MG PO TABS
500.0000 mg | ORAL_TABLET | Freq: Three times a day (TID) | ORAL | Status: AC
Start: 2013-10-18 — End: 2013-10-25

## 2013-10-18 MED ORDER — FLUCONAZOLE 150 MG PO TABS: 150.0000 mg | ORAL_TABLET | Freq: Once | ORAL | Status: DC

## 2013-10-18 NOTE — Telephone Encounter (Signed)
Adelene called back and left a message she is returning a call from [place].  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 She states she is familiar with BV and pharmacy had already called her.  Also wanted to know GC/CHlam results- given ( negative)Also informed her yeast on pap whci I explained we don't treat unless symtomps- she  requests treatment for c/o vaginal symptoms. - rx sent.

## 2013-10-18 NOTE — Telephone Encounter (Signed)
Message copied by Louanna Raw on Tue Oct 18, 2013  9:12 AM ------      Message from: [place], [place] L      Created: Mon Oct 17, 2013  2:26 AM      Regarding: BV tx        Has BV      I put in Flagyl order but would not eprescribe            Pap was normal            Deanna [name] ------

## 2013-10-18 NOTE — Telephone Encounter (Signed)
Flagyl eprescribed. Attempted to call patient. No answer. Left message stating we are calling with results and of information of a prescription we have had sent to your pharmacy, please call clinic.

## 2014-01-22 ENCOUNTER — Emergency Department (HOSPITAL_BASED_OUTPATIENT_CLINIC_OR_DEPARTMENT_OTHER)
Admission: EM | Admit: 2014-01-22 | Discharge: 2014-01-22 | Disposition: A | Discharge status: Home / Self Care | Payer: BC Managed Care – PPO | Attending: Emergency Medicine | Admitting: Emergency Medicine

## 2014-01-22 ENCOUNTER — Encounter (HOSPITAL_BASED_OUTPATIENT_CLINIC_OR_DEPARTMENT_OTHER): Payer: Self-pay | Admitting: Emergency Medicine

## 2014-01-22 DIAGNOSIS — Z79899 Other long term (current) drug therapy: Secondary | ICD-10-CM | POA: Insufficient documentation

## 2014-01-22 DIAGNOSIS — J020 Streptococcal pharyngitis: Secondary | ICD-10-CM | POA: Diagnosis not present

## 2014-01-22 DIAGNOSIS — Z87440 Personal history of urinary (tract) infections: Secondary | ICD-10-CM | POA: Insufficient documentation

## 2014-01-22 DIAGNOSIS — J029 Acute pharyngitis, unspecified: Secondary | ICD-10-CM | POA: Insufficient documentation

## 2014-01-22 LAB — RAPID STREP SCREEN (MED CTR MEBANE ONLY): Streptococcus, Group A Screen (Direct): POSITIVE — AB

## 2014-01-22 MED ORDER — PENICILLIN G BENZATHINE 1200000 UNIT/2ML IM SUSP: 1.2000 10*6.[IU] | Freq: Once | INTRAMUSCULAR | Status: AC

## 2014-01-22 MED ORDER — PENICILLIN G BENZATHINE 1200000 UNIT/2ML IM SUSP
2.4000 10*6.[IU] | Freq: Once | INTRAMUSCULAR | Status: DC
  Filled 2014-01-22: qty 4

## 2014-01-22 MED ORDER — PENICILLIN G BENZATHINE 1200000 UNIT/2ML IM SUSP
INTRAMUSCULAR | Status: AC
  Administered 2014-01-22: 1.2 10*6.[IU] via INTRAMUSCULAR
  Filled 2014-01-22: qty 4

## 2014-01-22 NOTE — Discharge Instructions (Signed)
Pharyngitis °Pharyngitis is redness, pain, and swelling (inflammation) of your pharynx.  °CAUSES  °Pharyngitis is usually caused by infection. Most of the time, these infections are from viruses (viral) and are part of a cold. However, sometimes pharyngitis is caused by bacteria (bacterial). Pharyngitis can also be caused by allergies. Viral pharyngitis may be spread from person to person by coughing, sneezing, and personal items or utensils (cups, forks, spoons, toothbrushes). Bacterial pharyngitis may be spread from person to person by more intimate contact, such as kissing.  °SIGNS AND SYMPTOMS  °Symptoms of pharyngitis include:   °· Sore throat.   °· Tiredness (fatigue).   °· Low-grade fever.   °· Headache. °· Joint pain and muscle aches. °· Skin rashes. °· Swollen lymph nodes. °· Plaque-like film on throat or tonsils (often seen with bacterial pharyngitis). °DIAGNOSIS  °Your health care provider will ask you questions about your illness and your symptoms. Your medical history, along with a physical exam, is often all that is needed to diagnose pharyngitis. Sometimes, a rapid strep test is done. Other lab tests may also be done, depending on the suspected cause.  °TREATMENT  °Viral pharyngitis will usually get better in 3-4 days without the use of medicine. Bacterial pharyngitis is treated with medicines that kill germs (antibiotics).  °HOME CARE INSTRUCTIONS  °· Drink enough water and fluids to keep your urine clear or pale yellow.   °· Only take over-the-counter or prescription medicines as directed by your health care provider:   °¨ If you are prescribed antibiotics, make sure you finish them even if you start to feel better.   °¨ Do not take aspirin.   °· Get lots of rest.   °· Gargle with 8 oz of salt water (½ tsp of salt per 1 qt of water) as often as every 1-2 hours to soothe your throat.   °· Throat lozenges (if you are not at risk for choking) or sprays may be used to soothe your throat. °SEEK MEDICAL  CARE IF:  °· You have large, tender lumps in your neck. °· You have a rash. °· You cough up green, yellow-brown, or bloody spit. °SEEK IMMEDIATE MEDICAL CARE IF:  °· Your neck becomes stiff. °· You drool or are unable to swallow liquids. °· You vomit or are unable to keep medicines or liquids down. °· You have severe pain that does not go away with the use of recommended medicines. °· You have trouble breathing (not caused by a stuffy nose). °MAKE SURE YOU:  °· Understand these instructions. °· Will watch your condition. °· Will get help right away if you are not doing well or get worse. °Document Released: 05/12/2005 Document Revised: 03/02/2013 Document Reviewed: 01/17/2013 °ExitCare® Patient Information ©2015 [employer]. This information is not intended to replace advice given to you by your health care provider. Make sure you discuss any questions you have with your health care provider. ° ° °Emergency Department Resource Guide °1) Find a Doctor and Pay Out of Pocket °Although you won't have to find out who is covered by your insurance plan, it is a good idea to ask around and get recommendations. You will then need to call the office and see if the doctor you have chosen will accept you as a new patient and what types of options they offer for patients who are self-pay. Some doctors offer discounts or will set up payment plans for their patients who do not have insurance, but you will need to ask so you aren't surprised when you get to your appointment. ° °  2) Contact Your Local Health Department °Not all health departments have doctors that can see patients for sick visits, but many do, so it is worth a call to see if yours does. If you don't know where your local health department is, you can check in your phone book. The CDC also has a tool to help you locate your state's health department, and many state websites also have listings of all of their local health departments. ° °3) Find a Walk-in Clinic °If  your illness is not likely to be very severe or complicated, you may want to try a walk in clinic. These are popping up all over the country in pharmacies, drugstores, and shopping centers. They're usually staffed by nurse practitioners or physician assistants that have been trained to treat common illnesses and complaints. They're usually fairly quick and inexpensive. However, if you have serious medical issues or chronic medical problems, these are probably not your best option. ° °No Primary Care Doctor: °- Call [facility] at  832-8000 - they can help you locate a primary care doctor that  accepts your insurance, provides certain services, etc. °- Physician Referral Service- 1-800-533-3463 ° °Chronic Pain Problems: °Organization         Address  Phone   Notes  °[facility]  (336) 297-2271 Patients need to be referred by their primary care doctor.  ° °Medication Assistance: °Organization         Address  Phone   Notes  °[place] [facility] [address] °[place], [place] [address] (336) 641-8030 --Must be a resident of [place] °-- Must have NO insurance coverage whatsoever (no Medicaid/ Medicare, etc.) °-- The pt. MUST have a primary care doctor that directs their care regularly and follows them in the community °  °[facility]  (866) 331-1348   °[employer]  (888) 892-1162   ° °Agencies that provide inexpensive medical care: °Organization         Address  Phone   Notes  °[facility]  (336) 832-8035   ° Internal Medicine    (336) 832-7272   °[facility] [address] °[place], [place] [address] (336) 832-4777   °[facility] [address], °[place] (336) 271-4999   °[facility]    (336) 373-0678   °[facility]    (336) 272-1050   °[facility] ° [address], [place] Phone:  (336) 832-4444, Fax:  (336) 832-4440 Hours of  Operation:  9 am - 6 pm, M-F.  Also accepts Medicaid/Medicare and self-pay.  °[facility] ° [address], [place] Phone: (336) 832-3150, Fax: (336) 832-3151. Hours of Operation:  8:30 am - 5:30 pm, M-F.  Also accepts Medicaid and self-pay.  °[facility] [place] [address], [place] Phone: (336) 878-6027   °[facility] [address], [place], [place] (336)723-1848, Ext. 123 Mondays & Thursdays: 7-9 AM.  First 15 patients are seen on a first come, first serve basis. °  ° °Medicaid-accepting [place] Providers: ° °Organization         Address  Phone   Notes  °[facility] [address], [place] (336) 641-2100 Also accepts self-pay patients.  °[facility] [address],  ° (336) 856-9996   °[facility] [address], [place] (336) 288-8857   °[facility] [address] [place] [address], [place] (336) 299-7000   °Veita Bland [address], [place]  ° (336) 373-1557 Only accepts [place] Access Medicaid patients after they have their name applied to their card.  ° °Self-Pay (no insurance) in [place]: ° °Organization         Address  Phone   Notes  °Sickle Cell Patients, [facility] [address], [place] (336) 832-1970   °[facility] [address], [place] (336) 832-4400   °[facility] ° [address], [place] (336) 992-4800   °[facility]/Dr. Osei-Bonsu ° [address] [place] [address], [place] or [address], [place] (336) 841-8500 Phone number for both [place] and [place] locations is the same.  °[facility] [address], [place] (336) 299-0000   °[facility] [address] [place] [address], [place] or [address] (336) 852-7530 °(336) 878-2260   °[facility] [address], [place] (336) 350-1642, phone; (336) 294-5005, fax Sees patients 1st and 3rd Saturday of every month.  Must not qualify for public or private insurance (i.e. Medicaid, Medicare, [place] Health Choice, Veterans' Benefits) • Household income should be no more than 200% of the poverty level •The clinic cannot treat you if you are pregnant or think you are pregnant • Sexually transmitted diseases are not treated at the clinic.  ° ° °Dental Care: °Organization         Address  Phone  Notes  °[facility] [facility] [address], [place] (336) 641-6152 Accepts children up to age 21 who are enrolled in Medicaid or [place] Health Choice; pregnant women with a Medicaid card; and children who have applied for Medicaid or [place] Health Choice, but were declined, whose parents can pay a reduced fee at time of service.  °[facility] [place]  [address], [place] (336) 641-7733 Accepts children up to age 21 who are enrolled in Medicaid or [place] Health Choice; pregnant women with a Medicaid card; and children who have applied for Medicaid or [place] Health Choice, but were declined, whose parents can pay a reduced fee at time of service.  °[facility] ° [address], [place] (336) 641-4533 Patients are seen by appointment only. Walk-ins are not accepted. [facility] will see patients 18 years of age and older. °Monday - Tuesday (8am-5pm) °Most Wednesdays (8:30-5pm) °$30 per visit, cash only  °[facility] ° [address], [place] (336) 641-4533 Patients are seen by appointment only. Walk-ins are not accepted. [facility] will see patients 18 years of age and older. °One Wednesday Evening (Monthly: Volunteer Based).  $30 per visit, cash only  °[facility]  (919) 537-3737 for adults; Children under age 4, call Graduate Pediatric  Dentistry at (919) [phone]. Children aged 4-14, please call (919) [phone] to request a pediatric application. ° Dental services are provided in all areas of dental care including fillings, crowns and bridges, complete and partial dentures, implants, gum treatment, root canals, and extractions. Preventive care is also provided. Treatment is provided to both adults and children. °Patients are selected via a lottery and there is often a waiting list. °  °[facility] [address], °  [place] ° (336) 763-8833 [email or web address] °  °[facility] [address], [place], [place] (336)723-1848, Ext. 123 Second and Fourth Thursday of each month, opens at 6:30 AM; Clinic ends at 9 AM.  Patients are seen on a first-come first-served basis, and a limited number are seen during each clinic.  ° °[facility] ° [address], [place], [place] (336) 723-7904   Eligibility Requirements °You must have lived in [place], [place], or [place] counties for at least the last three months. °  You cannot be eligible for state or federal sponsored healthcare insurance, including Veterans Administration, Medicaid, or Medicare. °  You generally cannot be eligible for healthcare insurance through your employer.  °  How to apply: °Eligibility screenings are held every Tuesday and Wednesday afternoon from 1:00 pm until 4:00 pm. You do not need an appointment for the interview!  °[facility] [address], [place], [place] 336-631-2330   °[facility]  336-342-8273   °[facility]  336-703-3100   °[facility]  336-570-6415   ° °Behavioral Health Resources in the Community: °Intensive Outpatient Programs °Organization         Address  Phone  Notes  °[facility] [address], [place], [place] 336-878-6098   °[facility] [address], [place], [place] 336-832-9800   °[facility] [address], [place], [place] ° 336-882-2125   °[facility] [address],  °[place], [place] 1-800-853-5163 or 336-641-4981   °Substance Abuse Resources °Organization         Address  Phone  Notes  °[facility]  336-882-2125   °[facility]  336-784-9470   °[facility]  336-285-9073   °[facility]  336-845-3988   °Residential & Outpatient Substance Abuse Program  1-800-659-3381   °Psychological Services °Organization         Address  Phone  Notes  °[facility]  336- 832-9600   °[facility]  336- 378-7881   °[facility] [address], [place] 1-800-853-5163 or 336-641-4981   ° °Mobile Crisis Teams °Organization         Address  Phone  Notes  °[facility], Mobile Crisis Care Unit  1-877-626-1772   °Assertive °[facility] ° [address] [place], [place] 336-834-9664   °Sharon DeEsch [address] °[place] 336-554-5454   ° °Self-Help/Support Groups °Organization         Address  Phone             Notes  °[facility] [place] - variety of support groups  336- 373-1402 Call for more information  °Narcotics Anonymous (NA), [facility] [address], °[place]  2 meetings at this location  ° °Residential Treatment Programs °Organization         Address  Phone  Notes  °[facility] [address],    °[place]  1-866-801-8205   °[facility] ° [address], [place], [place] 704-293-8524   °[facility] [address], [place] 336-845-3988 Admissions: 8am-3pm M-F  °[facility] [address]    °[place], [place] 336-841-1104   °[facility] [address], [place], [place] 336-379-7146   °[facility] [address]  °[place],  336-285-9073   °[facility] - Intensive Outpatient [address],  [place], [place] 336-852-3033   °[facility] [address]  °[place], [place] 1-877-615-2722 or 336-784-9470   °[facility] [address] [place], [place] 336-227-7417 Accepts Medicaid  °[facility] [address]  °[place] 1-800-659-3381 Substance Abuse/Addiction Treatment  ° °[place] Behavioral Health Resources °Organization         Address  Phone  Notes  °[facility]  (888) 581-9988   °Julie Brannon, PhD [address] [place], [place]   (336) 349-5553 or (336) 951-0000   °[facility]   [address] °[place], [place] (336) 349-4454   °[facility] [address], [place], [place] (336) 342-8316 Insurance/Medicaid/sponsorship through [facility]  °[facility] [address]                                    [place], [place] (336) 342-8316 Therapy/tele-psych/case  °[facility] [address]  ° [place],  (336) 349-2233    °Dr. Arfeen  (336) 349-4544   °[facility] [place]  [employer] [place] [facility] 1) [address],  °2) [address], [place] °3)  [address], [place] (336) 349-3220 °(336) 342-7768 ° °(336) 342-8140   °[place] [facility] (336) 342-1394 or (336) 342-3537 (After Hours)    ° ° ° ° °

## 2014-01-22 NOTE — ED Notes (Signed)
Sore throat x 2 days. Hurts when swallowing. Chills but no fever.

## 2014-01-22 NOTE — ED Provider Notes (Signed)
CSN: [id number]     Arrival date & time 01/22/14  1642 History  This chart was scribed for Elwin Mocha, MD, by Yevette Edwards, ED Scribe. This patient was seen in room MHT13/MHT13 and the patient's care was started at 6:18 PM.   First MD Initiated Contact with Patient 01/22/14 1744     Chief Complaint  Patient presents with  . Sore Throat    Patient is a 28 y.o. female presenting with pharyngitis. The history is provided by the patient. No language interpreter was used.  Sore Throat This is a new problem. The current episode started yesterday. The problem occurs constantly. The problem has been gradually worsening. Pertinent negatives include no abdominal pain and no headaches. The symptoms are aggravated by swallowing. Nothing relieves the symptoms. She has tried nothing for the symptoms. The treatment provided no relief.   HPI Comments: Deanna [name] is a 28 y.o. female who presents to the Emergency Department complaining of a sore throat, onset yesterday, which has been associated with intermittent fever, chills, and a mild cough. She reports the throat pain is increased with swallowing. In the ED her temperature is 97.7 F. She denies chronic medical problems. [name] is a non-smoker.   Past Medical History  Diagnosis Date  . Trichomonas   . Abnormal Pap smear   . Normal pregnancy 10/04/2011  . SVD (spontaneous vaginal delivery) 10/04/2011  . Infection     UTI   Past Surgical History  Procedure Laterality Date  . Leep     Family History  Problem Relation Age of Onset  . Anesthesia problems Neg Hx   . Hypotension Neg Hx   . Malignant hyperthermia Neg Hx   . Pseudochol deficiency Neg Hx   . Hypertension Mother   . Hypertension Father   . Diabetes Sister   . Diabetes Maternal Grandfather    History  Substance Use Topics  . Smoking status: Never Smoker   . Smokeless tobacco: Never Used  . Alcohol Use: Yes     Comment: occassionally   OB History   Grav Para Term  Preterm Abortions TAB SAB Ect Mult Living   0 0 0 0 0 0 2     Review of Systems  Constitutional: Positive for fever and chills.  HENT: Positive for sore throat.   Gastrointestinal: Negative for abdominal pain.  Neurological: Negative for headaches.  All other systems reviewed and are negative.   Allergies  Review of patient's allergies indicates no known allergies.  Home Medications   Prior to Admission medications   Medication Sig Start Date End Date Taking? Authorizing Provider  fluconazole (DIFLUCAN) 150 MG tablet Take 1 tablet (150 mg total) by mouth once. 10/18/13   Aviva Signs, CNM  prenatal vitamin w/FE, FA (PRENATAL 1 + 1) 27-1 MG TABS tablet Take 1 tablet by mouth daily at 12 noon.    Historical Provider, MD   Triage Vitals: BP 127/79  Pulse 100  Temp(Src) 97.7 F (36.5 C) (Oral)  Resp 18  Ht  (1.651 m)  Wt 260 lb (117.935 kg)  BMI 43.27 kg/m2  SpO2 100%  LMP 01/21/2014  Physical Exam  Nursing note and vitals reviewed. Constitutional: She appears well-developed and well-nourished. No distress.  HENT:  Head: Normocephalic and atraumatic.  Mouth/Throat: Posterior oropharyngeal erythema present. No oropharyngeal exudate or posterior oropharyngeal edema.  Eyes: Conjunctivae and EOM are normal. Pupils are equal, round, and reactive to light. Right eye exhibits no discharge.  Left eye exhibits no discharge. No scleral icterus.  Neck: Normal range of motion. Neck supple. No JVD present. No thyromegaly present.  Cardiovascular: Normal rate, regular rhythm, normal heart sounds and intact distal pulses.  Exam reveals no gallop and no friction rub.   No murmur heard. Pulmonary/Chest: Effort normal and breath sounds normal. No respiratory distress. She has no wheezes. She has no rales.  Abdominal: Soft. Bowel sounds are normal. She exhibits no distension and no mass. There is no tenderness.  Musculoskeletal: Normal range of motion. She exhibits no edema and no  tenderness.  Lymphadenopathy:    She has no cervical adenopathy.  Neurological: She is alert. Coordination normal.  Skin: Skin is warm and dry. No rash noted. No erythema.  Psychiatric: She has a normal mood and affect. Her behavior is normal.    ED Course  Procedures (including critical care time)  DIAGNOSTIC STUDIES: Oxygen Saturation is 100% on room air, normal by my interpretation.    COORDINATION OF CARE:  6:21 PM- Discussed treatment plan with patient, and the patient agreed to the plan. The plan includes antibiotic IM.   Labs Review Labs Reviewed  RAPID STREP SCREEN - Abnormal; Notable for the following:    Streptococcus, Group A Screen (Direct) POSITIVE (*)    All other components within normal limits    Imaging Review No results found.   EKG Interpretation None      MDM   Final diagnoses:  Strep pharyngitis    69F presents with sore throat. No fever. On exam, erythema in posterior pharynx, no exudates on my exam, states she saw exudates earlier. Strep positive, treated with penicillin.  I personally performed the services described in this documentation, which was scribed in my presence. The recorded information has been reviewed and is accurate.    Elwin Mocha, MD 01/22/14 [phone]

## 2014-03-23 ENCOUNTER — Inpatient Hospital Stay (HOSPITAL_COMMUNITY)
Admission: AD | Admit: 2014-03-23 | Discharge: 2014-03-23 | Disposition: A | Discharge status: Home / Self Care | Payer: BC Managed Care – PPO | Source: Ambulatory Visit | Attending: Obstetrics & Gynecology | Admitting: Obstetrics & Gynecology

## 2014-03-23 ENCOUNTER — Encounter (HOSPITAL_COMMUNITY): Payer: Self-pay | Admitting: *Deleted

## 2014-03-23 DIAGNOSIS — B9689 Other specified bacterial agents as the cause of diseases classified elsewhere: Secondary | ICD-10-CM

## 2014-03-23 DIAGNOSIS — N390 Urinary tract infection, site not specified: Secondary | ICD-10-CM

## 2014-03-23 DIAGNOSIS — B373 Candidiasis of vulva and vagina: Secondary | ICD-10-CM

## 2014-03-23 DIAGNOSIS — N760 Acute vaginitis: Secondary | ICD-10-CM | POA: Insufficient documentation

## 2014-03-23 DIAGNOSIS — B3731 Acute candidiasis of vulva and vagina: Secondary | ICD-10-CM

## 2014-03-23 HISTORY — DX: Unspecified abnormal cytological findings in specimens from vagina: R87.629

## 2014-03-23 LAB — URINE MICROSCOPIC-ADD ON

## 2014-03-23 LAB — URINALYSIS, ROUTINE W REFLEX MICROSCOPIC
Bilirubin Urine: NEGATIVE
Glucose, UA: NEGATIVE mg/dL
Hgb urine dipstick: NEGATIVE
Ketones, ur: NEGATIVE mg/dL
Nitrite: NEGATIVE
Protein, ur: NEGATIVE mg/dL
Specific Gravity, Urine: 1.01 (ref 1.005–1.030)
Urobilinogen, UA: 1 mg/dL (ref 0.0–1.0)
pH: 7 (ref 5.0–8.0)

## 2014-03-23 LAB — WET PREP, GENITAL: Trich, Wet Prep: NONE SEEN

## 2014-03-23 LAB — POCT PREGNANCY, URINE: Preg Test, Ur: NEGATIVE

## 2014-03-23 MED ORDER — METRONIDAZOLE 500 MG PO TABS: 500.0000 mg | ORAL_TABLET | Freq: Two times a day (BID) | ORAL | Status: DC

## 2014-03-23 MED ORDER — FLUCONAZOLE 150 MG PO TABS: 150.0000 mg | ORAL_TABLET | Freq: Every day | ORAL | Status: DC

## 2014-03-23 MED ORDER — CIPROFLOXACIN HCL 500 MG PO TABS: 500.0000 mg | ORAL_TABLET | Freq: Two times a day (BID) | ORAL | Status: DC

## 2014-03-23 NOTE — MAU Provider Note (Signed)
Attestation of Attending Supervision of Advanced Practitioner (CNM/NP): Evaluation and management procedures were performed by the Advanced Practitioner under my supervision and collaboration.  I have reviewed the Advanced Practitioner's note and chart, and I agree with the management and plan.  HARRAWAY-SMITH, Graci Hulce 7:36 PM

## 2014-03-23 NOTE — MAU Provider Note (Signed)
History     CSN: [id number]  Arrival date and time: 03/23/14 1706   First Provider Initiated Contact with Patient 03/23/14 1828      Chief Complaint  Patient presents with  . Abdominal Pain  . Back Pain   HPI Comments: Deanna [name] 28 y.o. [phone] [id number] presents to MAU with low pelvic pain and low back pain ongoing x 3 days. She also has painful urination and dark urine. She is sexually active with one partner and denies any vaginal complaints. She is on BCP.   Abdominal Pain Associated symptoms include dysuria.  Back Pain Associated symptoms include abdominal pain and dysuria.      Past Medical History  Diagnosis Date  . Trichomonas   . Abnormal Pap smear   . Normal pregnancy 10/04/2011  . SVD (spontaneous vaginal delivery) 10/04/2011  . Infection     UTI  . Vaginal Pap smear, abnormal     Past Surgical History  Procedure Laterality Date  . Leep      Family History  Problem Relation Age of Onset  . Anesthesia problems Neg Hx   . Hypotension Neg Hx   . Malignant hyperthermia Neg Hx   . Pseudochol deficiency Neg Hx   . Hypertension Mother   . Hypertension Father   . Diabetes Sister   . Diabetes Maternal Grandfather     History  Substance Use Topics  . Smoking status: Never Smoker   . Smokeless tobacco: Never Used  . Alcohol Use: Yes     Comment: occassionally    Allergies: No Known Allergies  Prescriptions prior to admission  Medication Sig Dispense Refill  . fluconazole (DIFLUCAN) 150 MG tablet Take 1 tablet (150 mg total) by mouth once.  1 tablet  0  . prenatal vitamin w/FE, FA (PRENATAL 1 + 1) 27-1 MG TABS tablet Take 1 tablet by mouth daily at 12 noon.        Review of Systems  Constitutional: Negative.   HENT: Negative.   Eyes: Negative.   Respiratory: Negative.   Cardiovascular: Negative.   Gastrointestinal: Positive for abdominal pain.  Genitourinary: Positive for dysuria.  Musculoskeletal: Positive for back pain.  Skin: Negative.    Neurological: Negative.   Psychiatric/Behavioral: Negative.    Physical Exam   Blood pressure 133/73, pulse 88, temperature 98.5 F (36.9 C), resp. rate 18, height 5\' 5"  (1.651 m), weight 117.708 kg (259 lb 8 oz), last menstrual period 03/08/2014.  Physical Exam  Constitutional: She is oriented to person, place, and time. She appears well-developed and well-nourished. No distress.  HENT:  Head: Normocephalic and atraumatic.  Eyes: Pupils are equal, round, and reactive to light.  GI: Soft. There is tenderness.  Genitourinary:  Genital:external negative Vaginal:small amount white discharge Cervix:no lesions Bimanual:tenderness   Musculoskeletal:  No flank pain  Neurological: She is alert and oriented to person, place, and time.  Skin: Skin is warm and dry.  Psychiatric: She has a normal mood and affect. Her behavior is normal. Judgment and thought content normal.   Results for orders placed during the hospital encounter of 03/23/14 (from the past 24 hour(s))  URINALYSIS, ROUTINE W REFLEX MICROSCOPIC     Status: Abnormal   Collection Time    03/23/14  5:45 PM      Result Value Ref Range   Color, Urine YELLOW  YELLOW   APPearance CLEAR  CLEAR   Specific Gravity, Urine 1.010  1.005 - 1.030   pH 7.0  5.0 - 8.0  Glucose, UA NEGATIVE  NEGATIVE mg/dL   Hgb urine dipstick NEGATIVE  NEGATIVE   Bilirubin Urine NEGATIVE  NEGATIVE   Ketones, ur NEGATIVE  NEGATIVE mg/dL   Protein, ur NEGATIVE  NEGATIVE mg/dL   Urobilinogen, UA 1.0  0.0 - 1.0 mg/dL   Nitrite NEGATIVE  NEGATIVE   Leukocytes, UA TRACE (*) NEGATIVE  URINE MICROSCOPIC-ADD ON     Status: Abnormal   Collection Time    03/23/14  5:45 PM      Result Value Ref Range   Squamous Epithelial / LPF FEW (*) RARE   WBC, UA 0-2  <3 WBC/hpf   RBC / HPF 0-2  <3 RBC/hpf  POCT PREGNANCY, URINE     Status: None   Collection Time    03/23/14  5:54 PM      Result Value Ref Range   Preg Test, Ur NEGATIVE  NEGATIVE  WET PREP, GENITAL      Status: Abnormal   Collection Time    03/23/14  6:40 PM      Result Value Ref Range   Yeast Wet Prep HPF POC MANY (*) NONE SEEN   Trich, Wet Prep NONE SEEN  NONE SEEN   Clue Cells Wet Prep HPF POC MODERATE (*) NONE SEEN   WBC, Wet Prep HPF POC FEW (*) NONE SEEN    MAU Course  Procedures  MDM Wet prep, GC, chlamydia. HIV. Urine culture Discussed with patient that her UA does not really show UTI and she ask if she could be treated till culture came back Assessment and Plan   A: Presumptive UTI Bacterial Vaginosis Vaginal Yeast  P: Cipro 500 mg BID x 3 days Increase fluids/ water Flagyl 500 mg BID x 7 days No alcohol Diflucan 150 mg PO #2 tabs Return prn  Carolynn ServeBarefoot, Deanna [name] 03/23/2014, 6:44 PM

## 2014-03-23 NOTE — Discharge Instructions (Signed)
Monilial Vaginitis Vaginitis in a soreness, swelling and redness (inflammation) of the vagina and vulva. Monilial vaginitis is not a sexually transmitted infection. CAUSES  Yeast vaginitis is caused by yeast (candida) that is normally found in your vagina. With a yeast infection, the candida has overgrown in number to a point that upsets the chemical balance. SYMPTOMS   White, thick vaginal discharge.  Swelling, itching, redness and irritation of the vagina and possibly the lips of the vagina (vulva).  Burning or painful urination.  Painful intercourse. DIAGNOSIS  Things that may contribute to monilial vaginitis are:  Postmenopausal and virginal states.  Pregnancy.  Infections.  Being tired, sick or stressed, especially if you had monilial vaginitis in the past.  Diabetes. Good control will help lower the chance.  Birth control pills.  Tight fitting garments.  Using bubble bath, feminine sprays, douches or deodorant tampons.  Taking certain medications that kill germs (antibiotics).  Sporadic recurrence can occur if you become ill. TREATMENT  Your caregiver will give you medication.  There are several kinds of anti monilial vaginal creams and suppositories specific for monilial vaginitis. For recurrent yeast infections, use a suppository or cream in the vagina 2 times a week, or as directed.  Anti-monilial or steroid cream for the itching or irritation of the vulva may also be used. Get your caregiver's permission.  Painting the vagina with methylene blue solution may help if the monilial cream does not work.  Eating yogurt may help prevent monilial vaginitis. HOME CARE INSTRUCTIONS   Finish all medication as prescribed.  Do not have sex until treatment is completed or after your caregiver tells you it is okay.  Take warm sitz baths.  Do not douche.  Do not use tampons, especially scented ones.  Wear cotton underwear.  Avoid tight pants and panty  hose.  Tell your sexual partner that you have a yeast infection. They should go to their caregiver if they have symptoms such as mild rash or itching.  Your sexual partner should be treated as well if your infection is difficult to eliminate.  Practice safer sex. Use condoms.  Some vaginal medications cause latex condoms to fail. Vaginal medications that harm condoms are:  Cleocin cream.  Butoconazole (Femstat).  Terconazole (Terazol) vaginal suppository.  Miconazole (Monistat) (may be purchased over the counter). SEEK MEDICAL CARE IF:   You have a temperature by mouth above 102 F (38.9 C).  The infection is getting worse after 2 days of treatment.  The infection is not getting better after 3 days of treatment.  You develop blisters in or around your vagina.  You develop vaginal bleeding, and it is not your menstrual period.  You have pain when you urinate.  You develop intestinal problems.  You have pain with sexual intercourse. Document Released: 02/19/2005 Document Revised: 08/04/2011 Document Reviewed: 11/03/2008 [facility] Patient Information 2015 [place], [place]. This information is not intended to replace advice given to you by your health care provider. Make sure you discuss any questions you have with your health care provider. Bacterial Vaginosis Bacterial vaginosis is a vaginal infection that occurs when the normal balance of bacteria in the vagina is disrupted. It results from an overgrowth of certain bacteria. This is the most common vaginal infection in women of childbearing age. Treatment is important to prevent complications, especially in pregnant women, as it can cause a premature delivery. CAUSES  Bacterial vaginosis is caused by an increase in harmful bacteria that are normally present in smaller amounts in the  vagina. Several different kinds of bacteria can cause bacterial vaginosis. However, the reason that the condition develops is not fully  understood. RISK FACTORS Certain activities or behaviors can put you at an increased risk of developing bacterial vaginosis, including:  Having a new sex partner or multiple sex partners.  Douching.  Using an intrauterine device (IUD) for contraception. Women do not get bacterial vaginosis from toilet seats, bedding, swimming pools, or contact with objects around them. SIGNS AND SYMPTOMS  Some women with bacterial vaginosis have no signs or symptoms. Common symptoms include:  Grey vaginal discharge.  A fishlike odor with discharge, especially after sexual intercourse.  Itching or burning of the vagina and vulva.  Burning or pain with urination. DIAGNOSIS  Your health care provider will take a medical history and examine the vagina for signs of bacterial vaginosis. A sample of vaginal fluid may be taken. Your health care provider will look at this sample under a microscope to check for bacteria and abnormal cells. A vaginal pH test may also be done.  TREATMENT  Bacterial vaginosis may be treated with antibiotic medicines. These may be given in the form of a pill or a vaginal cream. A second round of antibiotics may be prescribed if the condition comes back after treatment.  HOME CARE INSTRUCTIONS   Only take over-the-counter or prescription medicines as directed by your health care provider.  If antibiotic medicine was prescribed, take it as directed. Make sure you finish it even if you start to feel better.  Do not have sex until treatment is completed.  Tell all sexual partners that you have a vaginal infection. They should see their health care provider and be treated if they have problems, such as a mild rash or itching.  Practice safe sex by using condoms and only having one sex partner. SEEK MEDICAL CARE IF:   Your symptoms are not improving after 3 days of treatment.  You have increased discharge or pain.  You have a fever. MAKE SURE YOU:   Understand these  instructions.  Will watch your condition.  Will get help right away if you are not doing well or get worse. FOR MORE INFORMATION  Centers for Disease Control and Prevention, Division of STD Prevention: SolutionApps.co.zawww.cdc.gov/std American Sexual Health Association (ASHA): www.ashastd.org  Document Released: 05/12/2005 Document Revised: 03/02/2013 Document Reviewed: 12/22/2012 [facility] Patient Information 2015 [place]. This information is not intended to replace advice given to you by your health care provider. Make sure you discuss any questions you have with your health care provider. Urinary Tract Infection Urinary tract infections (UTIs) can develop anywhere along your urinary tract. Your urinary tract is your body's drainage system for removing wastes and extra water. Your urinary tract includes two kidneys, two ureters, a bladder, and a urethra. Your kidneys are a pair of bean-shaped organs. Each kidney is about the size of your fist. They are located below your ribs, one on each side of your spine. CAUSES Infections are caused by microbes, which are microscopic organisms, including fungi, viruses, and bacteria. These organisms are so small that they can only be seen through a microscope. Bacteria are the microbes that most commonly cause UTIs. SYMPTOMS  Symptoms of UTIs may vary by age and gender of the patient and by the location of the infection. Symptoms in young women typically include a frequent and intense urge to urinate and a painful, burning feeling in the bladder or urethra during urination. Older women and men are more likely to  be tired, shaky, and weak and have muscle aches and abdominal pain. A fever may mean the infection is in your kidneys. Other symptoms of a kidney infection include pain in your back or sides below the ribs, nausea, and vomiting. DIAGNOSIS To diagnose a UTI, your caregiver will ask you about your symptoms. Your caregiver also will ask to provide a urine sample.  The urine sample will be tested for bacteria and white blood cells. White blood cells are made by your body to help fight infection. TREATMENT  Typically, UTIs can be treated with medication. Because most UTIs are caused by a bacterial infection, they usually can be treated with the use of antibiotics. The choice of antibiotic and length of treatment depend on your symptoms and the type of bacteria causing your infection. HOME CARE INSTRUCTIONS  If you were prescribed antibiotics, take them exactly as your caregiver instructs you. Finish the medication even if you feel better after you have only taken some of the medication.  Drink enough water and fluids to keep your urine clear or pale yellow.  Avoid caffeine, tea, and carbonated beverages. They tend to irritate your bladder.  Empty your bladder often. Avoid holding urine for long periods of time.  Empty your bladder before and after sexual intercourse.  After a bowel movement, women should cleanse from front to back. Use each tissue only once. SEEK MEDICAL CARE IF:   You have back pain.  You develop a fever.  Your symptoms do not begin to resolve within 3 days. SEEK IMMEDIATE MEDICAL CARE IF:   You have severe back pain or lower abdominal pain.  You develop chills.  You have nausea or vomiting.  You have continued burning or discomfort with urination. MAKE SURE YOU:   Understand these instructions.  Will watch your condition.  Will get help right away if you are not doing well or get worse. Document Released: 02/19/2005 Document Revised: 11/11/2011 Document Reviewed: 06/20/2011 [facility] Patient Information 2015 [place], [place]. This information is not intended to replace advice given to you by your health care provider. Make sure you discuss any questions you have with your health care provider.

## 2014-03-23 NOTE — MAU Note (Signed)
Pt reports she is having abd back pain on and off for 2 days. C/O pain with urination and dark smelly urine as well.

## 2014-03-24 LAB — GC/CHLAMYDIA PROBE AMP
CT Probe RNA: NEGATIVE
GC Probe RNA: NEGATIVE

## 2014-03-24 LAB — HIV ANTIBODY (ROUTINE TESTING W REFLEX): HIV 1&2 Ab, 4th Generation: NONREACTIVE

## 2014-03-26 LAB — URINE CULTURE
Colony Count: >=100000
Special Requests: NORMAL

## 2014-03-27 ENCOUNTER — Encounter (HOSPITAL_COMMUNITY): Payer: Self-pay | Admitting: *Deleted

## 2014-10-11 ENCOUNTER — Emergency Department (HOSPITAL_COMMUNITY)
Admission: EM | Admit: 2014-10-11 | Discharge: 2014-10-11 | Disposition: A | Discharge status: Home / Self Care | Payer: Self-pay | Attending: Emergency Medicine | Admitting: Emergency Medicine

## 2014-10-11 ENCOUNTER — Encounter (HOSPITAL_COMMUNITY): Payer: Self-pay | Admitting: Emergency Medicine

## 2014-10-11 ENCOUNTER — Emergency Department (HOSPITAL_COMMUNITY): Payer: Self-pay

## 2014-10-11 DIAGNOSIS — Z87440 Personal history of urinary (tract) infections: Secondary | ICD-10-CM | POA: Insufficient documentation

## 2014-10-11 DIAGNOSIS — Y9389 Activity, other specified: Secondary | ICD-10-CM | POA: Insufficient documentation

## 2014-10-11 DIAGNOSIS — S60311A Abrasion of right thumb, initial encounter: Secondary | ICD-10-CM | POA: Insufficient documentation

## 2014-10-11 DIAGNOSIS — Z8619 Personal history of other infectious and parasitic diseases: Secondary | ICD-10-CM | POA: Insufficient documentation

## 2014-10-11 DIAGNOSIS — Y998 Other external cause status: Secondary | ICD-10-CM | POA: Insufficient documentation

## 2014-10-11 DIAGNOSIS — S63601A Unspecified sprain of right thumb, initial encounter: Secondary | ICD-10-CM | POA: Insufficient documentation

## 2014-10-11 DIAGNOSIS — Y9289 Other specified places as the place of occurrence of the external cause: Secondary | ICD-10-CM | POA: Insufficient documentation

## 2014-10-11 DIAGNOSIS — Z23 Encounter for immunization: Secondary | ICD-10-CM | POA: Insufficient documentation

## 2014-10-11 DIAGNOSIS — Z79899 Other long term (current) drug therapy: Secondary | ICD-10-CM | POA: Insufficient documentation

## 2014-10-11 DIAGNOSIS — W1839XA Other fall on same level, initial encounter: Secondary | ICD-10-CM | POA: Insufficient documentation

## 2014-10-11 MED ORDER — HYDROCODONE-ACETAMINOPHEN 5-325 MG PO TABS: 1.0000 | ORAL_TABLET | Freq: Four times a day (QID) | ORAL | Status: DC | PRN

## 2014-10-11 MED ORDER — NAPROXEN 500 MG PO TABS: 500.0000 mg | ORAL_TABLET | Freq: Two times a day (BID) | ORAL | Status: DC

## 2014-10-11 MED ORDER — CYCLOBENZAPRINE HCL 5 MG PO TABS: 5.0000 mg | ORAL_TABLET | Freq: Two times a day (BID) | ORAL | Status: DC | PRN

## 2014-10-11 MED ORDER — TETANUS-DIPHTH-ACELL PERTUSSIS 5-2.5-18.5 LF-MCG/0.5 IM SUSP
0.5000 mL | Freq: Once | INTRAMUSCULAR | Status: AC
  Administered 2014-10-11: 0.5 mL via INTRAMUSCULAR
  Filled 2014-10-11: qty 0.5

## 2014-10-11 MED ORDER — IBUPROFEN 400 MG PO TABS
400.0000 mg | ORAL_TABLET | Freq: Once | ORAL | Status: AC
  Administered 2014-10-11: 400 mg via ORAL
  Filled 2014-10-11: qty 1

## 2014-10-11 NOTE — ED Provider Notes (Signed)
CSN: [id number]     Arrival date & time 10/11/14  [id number] History   First MD Initiated Contact with Patient 10/11/14 [phone]     Chief Complaint  Patient presents with  . Finger Injury     (Consider location/radiation/quality/duration/timing/severity/associated sxs/prior Treatment) HPI   PCP: No PCP Per Patient Blood pressure 133/71, pulse 110, temperature 97.2 F (36.2 C), temperature source Oral, resp. rate 16, height 5\' 5"  (1.651 m), weight 250 lb (113.399 kg), SpO2 100 %.  Deanna [name] is a 29 y.o.female with a significant PMH of trichomonas  presents to the ER with complaints of right thumb injury. She reports falling and injuring her right thumb. She sustained a small abrasion and her finger is now swollen. She has not washed it but did place a band-aid over the wound. She denies any other injuries, no head or neck injury, no loc.   Past Medical History  Diagnosis Date  . Trichomonas   . Abnormal Pap smear   . Normal pregnancy 10/04/2011  . SVD (spontaneous vaginal delivery) 10/04/2011  . Infection     UTI  . Vaginal Pap smear, abnormal    Past Surgical History  Procedure Laterality Date  . Leep     Family History  Problem Relation Age of Onset  . Anesthesia problems Neg Hx   . Hypotension Neg Hx   . Malignant hyperthermia Neg Hx   . Pseudochol deficiency Neg Hx   . Hypertension Mother   . Hypertension Father   . Diabetes Sister   . Diabetes Maternal Grandfather    History  Substance Use Topics  . Smoking status: Never Smoker   . Smokeless tobacco: Never Used  . Alcohol Use: Yes     Comment: occassionally   OB History    Gravida Para Term Preterm AB TAB SAB Ectopic Multiple Living   2 2 2  0 0 0 0 0 0 2     Review of Systems   Review of Systems  Gen: no weight loss, fevers, chills, night sweats  Eyes: no occular draining, occular pain,  No visual changes  Nose: no epistaxis or rhinorrhea  Mouth: no dental pain, no sore throat  Neck: no neck pain   Lungs: No hemoptysis. No wheezing or coughing CV:  No palpitations, dependent edema or orthopnea. No chest pain Abd: no diarrhea. No nausea or vomiting, No abdominal pain  GU: no dysuria or gross hematuria  MSK:  No muscle weakness, + muscular pain Neuro: no headache, no focal neurologic deficits  Skin: no rash , no wounds Psyche: no complaints of depression or anxiety    Allergies  Review of patient's allergies indicates no known allergies.  Home Medications   Prior to Admission medications   Medication Sig Start Date End Date Taking? Authorizing Provider  acetaminophen (TYLENOL) 325 MG tablet Take by mouth every 6 (six) hours as needed for headache.    Historical Provider, MD  ciprofloxacin (CIPRO) 500 MG tablet Take 1 tablet (500 mg total) by mouth 2 (two) times daily. 03/23/14   Delbert PhenixLinda M Barefoot, NP  cyclobenzaprine (FLEXERIL) 5 MG tablet Take 1 tablet (5 mg total) by mouth 2 (two) times daily as needed for muscle spasms. 10/11/14   Ysidra Sopher Neva SeatGreene, PA-C  fluconazole (DIFLUCAN) 150 MG tablet Take 1 tablet (150 mg total) by mouth daily. 03/23/14   Delbert PhenixLinda M Barefoot, NP  HYDROcodone-acetaminophen (NORCO/VICODIN) 5-325 MG per tablet Take 1 tablet by mouth every 6 (six) hours as needed. 10/11/14  Marlon Peliffany Shekina Cordell, PA-C  metroNIDAZOLE (FLAGYL) 500 MG tablet Take 1 tablet (500 mg total) by mouth 2 (two) times daily. 03/23/14   Delbert PhenixLinda M Barefoot, NP  naproxen (NAPROSYN) 500 MG tablet Take 1 tablet (500 mg total) by mouth 2 (two) times daily. 10/11/14   Rhonin Trott Neva SeatGreene, PA-C  OVER THE COUNTER MEDICATION Take 1 tablet by mouth daily. Over the counter daily vitamin "Hair, Skin, & Nails" made by [employer].    Historical Provider, MD   BP 133/71 mmHg  Pulse 110  Temp(Src) 97.2 F (36.2 C) (Oral)  Resp 16  Ht 5\' 5"  (1.651 m)  Wt 250 lb (113.399 kg)  BMI 41.60 kg/m2  SpO2 100% Physical Exam  Constitutional: She appears well-developed and well-nourished. No distress.  HENT:  Head:  Normocephalic and atraumatic.  Eyes: Pupils are equal, round, and reactive to light.  Neck: Normal range of motion. Neck supple.  Cardiovascular: Normal rate and regular rhythm.   Pulmonary/Chest: Effort normal.  Abdominal: Soft.  Musculoskeletal:       Right hand: She exhibits decreased range of motion, tenderness and swelling. She exhibits no bony tenderness, normal two-point discrimination, normal capillary refill, no deformity and no laceration. Normal sensation noted.  Right thumb has small abrasion to lateral thumb. Decreased ROM but pt is able to flex, extend and move lateral and medially with discomfort.   CR < 2 seconds. Sensation to distal finger intact.  Neurological: She is alert.  Skin: Skin is warm and dry.  Nursing note and vitals reviewed.  ED Course  Procedures (including critical care time) Labs Review Labs Reviewed - No data to display  Imaging Review Dg Finger Thumb Right  10/11/2014   CLINICAL DATA:  Fall with laceration and pain in the interphalangeal joint of the right thumb, initial encounter.  EXAM: RIGHT THUMB 2+V  COMPARISON:  None.  FINDINGS: No acute osseous or joint abnormality.  IMPRESSION: No acute osseous or joint abnormality.   Electronically Signed   By: Leanna BattlesMelinda  Blietz M.D.   On: 10/11/2014 07:55     EKG Interpretation None      MDM   Final diagnoses:  Thumb sprain, right, initial encounter    Ice applied Thumb spica wrist splint Hand referred Tetanus shot given.  Recommend RICE. Primarily NSAIDs and muscle relaxers for pain control  29 y.o.Deanna [name]'s evaluation in the Emergency Department is complete. It has been determined that no acute conditions requiring further emergency intervention are present at this time. The patient/guardian have been advised of the diagnosis and plan. We have discussed signs and symptoms that warrant return to the ED, such as changes or worsening in symptoms.  Vital signs are stable at  discharge. Filed Vitals:   10/11/14 0659  BP: 133/71  Pulse: 110  Temp: 97.2 F (36.2 C)  Resp: 16    Patient/guardian has voiced understanding and agreed to follow-up with the PCP or specialist.   Marlon Peliffany Kosei Rhodes, PA-C 10/11/14 0814  Marlon Peliffany Aleyssa Pike, PA-C 10/11/14 [id number]  Blake DivineJohn Wofford, MD 10/11/14 [phone]

## 2014-10-11 NOTE — ED Notes (Signed)
Pt. fell and injured her right thumb last night , no LOC / ambulatory , presents with right thumb pain and mild swelling .

## 2014-10-11 NOTE — Progress Notes (Signed)
Orthopedic Tech Progress Note Patient Details:  Deanna [name] 06/15/1985 [id number]  Ortho Devices Type of Ortho Device: Thumb velcro splint Ortho Device/Splint Location: rue Ortho Device/Splint Interventions: Application   Deanna [name] 10/11/2014, 8:29 AM

## 2014-10-11 NOTE — Discharge Instructions (Signed)
Thumb Sprain °Your exam shows you have a sprained thumb. This means the ligaments around the joint have been torn. Thumb sprains usually take 3-6 weeks to heal. However, severe, unstable sprains may need to be fixed surgically. Sometimes a small piece of bone is pulled off by the ligament. If this is not treated properly, a sprained thumb can lead to a painful, weak joint. Treatment helps reduce pain and shortens the period of disability. °The thumb, and often the wrist, must remain splinted for the first 2-4 weeks to protect the joint. Keep your hand elevated and apply ice packs frequently to the injured area (20-30 minutes every 2-3 hours) for the next 2-4 days. This helps reduce swelling and control pain. Pain medicine may also be used for several days. Motion and strengthening exercises may later be prescribed for the joint to return to normal function. Be sure to see your doctor for follow-up because your thumb joint may require further support with splints, bandages or tape. Please see your doctor or go to the emergency room right away if you have increased pain despite proper treatment, or a numb, cold, or pale thumb. °Document Released: 06/19/2004 Document Revised: 08/04/2011 Document Reviewed: 05/13/2008 °ExitCare® Patient Information ©2015 Exit[employer], LLC. This information is not intended to replace advice given to you by your health care provider. Make sure you discuss any questions you have with your health care provider. ° °

## 2015-12-07 ENCOUNTER — Inpatient Hospital Stay (HOSPITAL_COMMUNITY)
Admission: AD | Admit: 2015-12-07 | Discharge: 2015-12-07 | Disposition: A | Discharge status: Home / Self Care | Payer: Self-pay | Source: Ambulatory Visit | Attending: Obstetrics & Gynecology | Admitting: Obstetrics & Gynecology

## 2015-12-07 ENCOUNTER — Encounter (HOSPITAL_COMMUNITY): Payer: Self-pay | Admitting: *Deleted

## 2015-12-07 DIAGNOSIS — N938 Other specified abnormal uterine and vaginal bleeding: Secondary | ICD-10-CM

## 2015-12-07 DIAGNOSIS — N390 Urinary tract infection, site not specified: Secondary | ICD-10-CM

## 2015-12-07 LAB — CBC
HCT: 37.9 % (ref 36.0–46.0)
Hemoglobin: 12.7 g/dL (ref 12.0–15.0)
MCH: 27.7 pg (ref 26.0–34.0)
MCHC: 33.5 g/dL (ref 30.0–36.0)
MCV: 82.6 fL (ref 78.0–100.0)
Platelets: 243 10*3/uL (ref 150–400)
RBC: 4.59 MIL/uL (ref 3.87–5.11)
RDW: 14.5 % (ref 11.5–15.5)
WBC: 5.5 10*3/uL (ref 4.0–10.5)

## 2015-12-07 LAB — URINE MICROSCOPIC-ADD ON

## 2015-12-07 LAB — URINALYSIS, ROUTINE W REFLEX MICROSCOPIC
Bilirubin Urine: NEGATIVE
Glucose, UA: NEGATIVE mg/dL
Ketones, ur: 15 mg/dL — AB
Nitrite: POSITIVE — AB
Protein, ur: 100 mg/dL — AB
Specific Gravity, Urine: 1.025 (ref 1.005–1.030)
pH: 5.5 (ref 5.0–8.0)

## 2015-12-07 LAB — POCT PREGNANCY, URINE: Preg Test, Ur: NEGATIVE

## 2015-12-07 MED ORDER — NAPROXEN SODIUM 220 MG PO TABS: 220.0000 mg | ORAL_TABLET | Freq: Two times a day (BID) | ORAL | Status: DC

## 2015-12-07 MED ORDER — NITROFURANTOIN MONOHYD MACRO 100 MG PO CAPS: 100.0000 mg | ORAL_CAPSULE | Freq: Two times a day (BID) | ORAL | Status: DC

## 2015-12-07 MED ORDER — NORETHINDRONE ACET-ETHINYL EST 1-20 MG-MCG PO TABS: 2.0000 | ORAL_TABLET | Freq: Every day | ORAL | Status: DC

## 2015-12-07 NOTE — Discharge Instructions (Signed)

## 2015-12-07 NOTE — MAU Provider Note (Signed)
History     CSN: [id number]  Arrival date and time: 12/07/15 1021   First Provider Initiated Contact with Patient 12/07/15 1117      Chief Complaint  Patient presents with  . Abdominal Pain  . Abdominal Cramping   HPI Comments: Non-pregnant female c/o heavy menstrual bleeding. She reports having Mirena removed about 3 mos ago and has continued to intermittently bleed since. She reports bleeding is heavy at times and passed a few dime sized clots today. She also reports uterine cramping with the bleeding. She has not had IC since Mirena was removed. She was previously on OCP prior to Mirena but was d/c'd d/t one elevated BP per her report. She reports she had neg STD screen at the HD about 2 wks ago and has a f/u appt scheduled in 2 wks for the bleeding. She is married and has not had any new partners.  Abdominal Pain Pertinent negatives include no dysuria, frequency or hematuria.  Abdominal Cramping Pertinent negatives include no dysuria, frequency or hematuria.    Pertinent Gynecological History: Menses: irregular Bleeding: intermenstrual bleeding Contraception: abstinence   Past Medical History  Diagnosis Date  . Trichomonas   . Abnormal Pap smear   . Normal pregnancy 10/04/2011  . SVD (spontaneous vaginal delivery) 10/04/2011  . Infection     UTI  . Vaginal Pap smear, abnormal     Past Surgical History  Procedure Laterality Date  . Leep    . No past surgeries      Family History  Problem Relation Age of Onset  . Anesthesia problems Neg Hx   . Hypotension Neg Hx   . Malignant hyperthermia Neg Hx   . Pseudochol deficiency Neg Hx   . Hypertension Mother   . Hypertension Father   . Diabetes Sister   . Diabetes Maternal Grandfather     Social History  Substance Use Topics  . Smoking status: Never Smoker   . Smokeless tobacco: Never Used  . Alcohol Use: Yes     Comment: occassionally    Allergies: No Known Allergies  Prescriptions prior to admission   Medication Sig Dispense Refill Last Dose  . acetaminophen (TYLENOL) 325 MG tablet Take 650 mg by mouth every 6 (six) hours as needed for mild pain or headache.    12/06/2015 at Unknown time  . OVER THE COUNTER MEDICATION Take 1 tablet by mouth daily. Over the counter daily vitamin "Hair, Skin, & Nails" made by [employer].   Past Week at Unknown time    Review of Systems  Cardiovascular: Negative.   Gastrointestinal: Positive for abdominal pain.  Genitourinary: Negative for dysuria, urgency, frequency and hematuria.       +VB  Neurological: Negative for dizziness, focal weakness and weakness.  Endo/Heme/Allergies: Negative.    Physical Exam   Blood pressure 132/89, pulse 90, temperature 98.3 F (36.8 C), temperature source Oral, resp. rate 20, weight 125.079 kg (275 lb 12 oz), SpO2 100 %.  Physical Exam  Constitutional: She is oriented to person, place, and time. She appears well-developed and well-nourished.  HENT:  Head: Normocephalic and atraumatic.  Neck: Normal range of motion.  Cardiovascular: Normal rate.   Respiratory: Effort normal.  GI: Soft. She exhibits no distension. There is no tenderness. There is no rebound and no guarding.  Genitourinary:  External: No lesions Vagina: rugated, parous, moderate, dark red bloody discharge, no clots Uterus: non-enlarged, anteverted, non-tender, no CMT Adnexae: no masses, no tenderness left, no tenderness right   Musculoskeletal:  Normal range of motion.  Neurological: She is alert and oriented to person, place, and time.  Skin: Skin is warm and dry.  Psychiatric: She has a normal mood and affect.   Results for orders placed or performed during the hospital encounter of 12/07/15 (from the past 24 hour(s))  Urinalysis, Routine w reflex microscopic (not [facility])     Status: Abnormal   Collection Time: 12/07/15 10:45 AM  Result Value Ref Range   Color, Urine RED (A) YELLOW   APPearance CLOUDY (A) CLEAR   Specific Gravity, Urine  1.025 1.005 - 1.030   pH 5.5 5.0 - 8.0   Glucose, UA NEGATIVE NEGATIVE mg/dL   Hgb urine dipstick LARGE (A) NEGATIVE   Bilirubin Urine NEGATIVE NEGATIVE   Ketones, ur 15 (A) NEGATIVE mg/dL   Protein, ur 324100 (A) NEGATIVE mg/dL   Nitrite POSITIVE (A) NEGATIVE   Leukocytes, UA TRACE (A) NEGATIVE  Urine microscopic-add on     Status: Abnormal   Collection Time: 12/07/15 10:45 AM  Result Value Ref Range   Squamous Epithelial / LPF 0-5 (A) NONE SEEN   WBC, UA 0-5 0 - 5 WBC/hpf   RBC / HPF TOO NUMEROUS TO COUNT 0 - 5 RBC/hpf   Bacteria, UA FEW (A) NONE SEEN  Pregnancy, urine POC     Status: None   Collection Time: 12/07/15 11:06 AM  Result Value Ref Range   Preg Test, Ur NEGATIVE NEGATIVE  CBC     Status: None   Collection Time: 12/07/15 12:02 PM  Result Value Ref Range   WBC 5.5 4.0 - 10.5 K/uL   RBC 4.59 3.87 - 5.11 MIL/uL   Hemoglobin 12.7 12.0 - 15.0 g/dL   HCT 40.137.9 02.736.0 - 25.346.0 %   MCV 82.6 78.0 - 100.0 fL   MCH 27.7 26.0 - 34.0 pg   MCHC 33.5 30.0 - 36.0 g/dL   RDW 66.414.5 40.311.5 - 47.415.5 %   Platelets 243 150 - 400 K/uL    MAU Course  Procedures  MDM Labs ordered and reviewed. DUB and UTI. Urine culture pending. No evidence of uterine fibroids on exam. Stable for discharge home.  Assessment and Plan   1. Dysfunctional uterine bleeding   2. UTI (lower urinary tract infection)    Discharge home Junel 1/20 2 tab daily x5 days or until bleeding stops then 1 po daily Naproxen 220 mg 1 po bid prn Macrobid 100 mg po bid x5 days [facility] as scheduled in 2 wks Return for worsening sx  Donette LarryMelanie Saw Mendenhall, CNM 12/07/2015, 12:23 PM

## 2015-12-07 NOTE — MAU Note (Signed)
keep having heavy bleeding and heavy clots.  Had IUD removed in March  Ever since has been having the heavy bleeding.  Went to HD 2 wks ago, did screening for STD- all was negative.

## 2015-12-08 LAB — URINE CULTURE

## 2017-05-11 NOTE — Progress Notes (Deleted)
Subjective:    Patient ID: Deanna [name], female    DOB: 1985/12/06, 31 y.o.   MRN: [id number]   CC: New Patient  HPI: PMHx: Past Medical History:  Diagnosis Date  . Abnormal Pap smear   . Infection    UTI  . Normal pregnancy 10/04/2011  . SVD (spontaneous vaginal delivery) 10/04/2011  . Trichomonas   . Vaginal Pap smear, abnormal      Surgical Hx: Past Surgical History:  Procedure Laterality Date  . LEEP    . NO PAST SURGERIES       Family Hx: Family History  Problem Relation Age of Onset  . Anesthesia problems Neg Hx   . Hypotension Neg Hx   . Malignant hyperthermia Neg Hx   . Pseudochol deficiency Neg Hx   . Hypertension Mother   . Hypertension Father   . Diabetes Sister   . Diabetes Maternal Grandfather      Social Hx: Current Social History   (Please include date ( .td) when updating information )  Who lives at home: *** 05/11/2017  Who would speak for you about health care matters: *** 05/11/2017  Transportation: *** 05/11/2017 Important Relationships & Pets: *** 05/11/2017  Current Stressors: *** 05/11/2017 Work / Education:  *** 05/11/2017 Religious / Personal Beliefs: *** 05/11/2017 Interests / Fun: *** 05/11/2017 Other: *** 05/11/2017   Medications:   ROS: Woman:  Patient reports no  vision/ hearing changes,anorexia, weight change, fever ,adenopathy, persistant / recurrent hoarseness, swallowing issues, chest pain, edema,persistant / recurrent cough, hemoptysis, dyspnea(rest, exertional, paroxysmal nocturnal), gastrointestinal  bleeding (melena, rectal bleeding), abdominal pain, excessive heart burn, GU symptoms(dysuria, hematuria, pyuria, voiding/incontinence  Issues) syncope, focal weakness, severe memory loss, concerning skin lesions, depression, anxiety, abnormal bruising/bleeding, major joint swelling, breast masses or abnormal vaginal bleeding.    Man:  Patient reports no  vision/ hearing changes,anorexia, weight change, fever  ,adenopathy, persistant / recurrent hoarseness, swallowing issues, chest pain, edema,persistant / recurrent cough, hemoptysis, dyspnea(rest, exertional, paroxysmal nocturnal), gastrointestinal  bleeding (melena, rectal bleeding), abdominal pain, excessive heart burn, GU symptoms(dysuria, hematuria, pyuria, voiding/incontinence  Issues) syncope, focal weakness, severe memory loss, concerning skin lesions, depression, anxiety, abnormal bruising/bleeding, major joint swelling.    Preventative Screening Colonoscopy: year*** results *** Mammogram: year*** results *** Pap test: year*** results *** PSA: year*** results *** DEXA: year*** results *** Tetanus vaccine: year*** results *** Pneumonia vaccine: year*** results *** Shingles vaccine: year*** results *** Heart stress test: year*** results *** Echocardiogram: year*** results *** Xrays: year*** results *** CT/MRI: year*** results ***  Smoking status reviewed  Review of Systems   Objective:  There were no vitals taken for this visit. Vitals and nursing note reviewed  General: well nourished, in no acute distress HEENT: normocephalic, TM's visualized bilaterally, no scleral icterus or conjunctival pallor, no nasal discharge, moist mucous membranes, good dentition without erythema or discharge noted in posterior oropharynx Neck: supple, non-tender, without lymphadenopathy Cardiac: RRR, clear S1 and S2, no murmurs, rubs, or gallops Respiratory: clear to auscultation bilaterally, no increased work of breathing Abdomen: soft, nontender, nondistended, no masses or organomegaly. Bowel sounds present Extremities: no edema or cyanosis. Warm, well perfused. 2+ radial and PT pulses bilaterally Skin: warm and dry, no rashes noted Neuro: alert and oriented, no focal deficits   Assessment & Plan:    No problem-specific Assessment & Plan notes found for this encounter.    No Follow-up on file.   Oralia Manis, DO, PGY-1

## 2017-05-13 ENCOUNTER — Ambulatory Visit: Payer: Self-pay | Admitting: Family Medicine

## 2017-05-13 ENCOUNTER — Encounter: Payer: Self-pay | Admitting: Family Medicine

## 2017-07-05 ENCOUNTER — Emergency Department (HOSPITAL_COMMUNITY)
Admission: EM | Admit: 2017-07-05 | Discharge: 2017-07-05 | Disposition: A | Discharge status: Home / Self Care | Payer: Commercial Managed Care - PPO | Attending: Emergency Medicine | Admitting: Emergency Medicine

## 2017-07-05 ENCOUNTER — Encounter (HOSPITAL_COMMUNITY): Payer: Self-pay

## 2017-07-05 ENCOUNTER — Other Ambulatory Visit: Payer: Self-pay

## 2017-07-05 DIAGNOSIS — X500XXA Overexertion from strenuous movement or load, initial encounter: Secondary | ICD-10-CM | POA: Insufficient documentation

## 2017-07-05 DIAGNOSIS — Z79899 Other long term (current) drug therapy: Secondary | ICD-10-CM | POA: Insufficient documentation

## 2017-07-05 DIAGNOSIS — Y939 Activity, unspecified: Secondary | ICD-10-CM | POA: Diagnosis not present

## 2017-07-05 DIAGNOSIS — Y929 Unspecified place or not applicable: Secondary | ICD-10-CM | POA: Insufficient documentation

## 2017-07-05 DIAGNOSIS — S3992XA Unspecified injury of lower back, initial encounter: Secondary | ICD-10-CM | POA: Diagnosis present

## 2017-07-05 DIAGNOSIS — Y999 Unspecified external cause status: Secondary | ICD-10-CM | POA: Insufficient documentation

## 2017-07-05 DIAGNOSIS — S39012A Strain of muscle, fascia and tendon of lower back, initial encounter: Secondary | ICD-10-CM

## 2017-07-05 MED ORDER — CYCLOBENZAPRINE HCL 10 MG PO TABS: 10.0000 mg | ORAL_TABLET | Freq: Two times a day (BID) | ORAL | 0 refills | Status: DC | PRN

## 2017-07-05 MED ORDER — KETOROLAC TROMETHAMINE 60 MG/2ML IM SOLN: 60.0000 mg | Freq: Once | INTRAMUSCULAR | Status: DC

## 2017-07-05 MED ORDER — IBUPROFEN 800 MG PO TABS: 800.0000 mg | ORAL_TABLET | Freq: Three times a day (TID) | ORAL | 0 refills | Status: DC

## 2017-07-05 MED ORDER — CYCLOBENZAPRINE HCL 10 MG PO TABS: 10.0000 mg | ORAL_TABLET | Freq: Once | ORAL | Status: DC

## 2017-07-05 NOTE — ED Triage Notes (Signed)
Pt states that she has lower back pain and pain between her shoulders. She says that she thinks that she pulled a muscle. A&Ox4. Ambulatory. Denies urinary symptoms.

## 2017-07-05 NOTE — Discharge Instructions (Signed)
Your injury is consistent with a muscle strain.  Take 1 tablet of ibuprofen with food every 8 hours as needed for pain and inflammation.  You were given a dose of Toradol in the emergency department so please do not take your first dose for at least 8 hours.  Take 1 tablet of Flexeril up to 2 times daily for muscle pain and spasms.  You were also given a dose of this in the emergency department so please do not take an additional dose tonight.  You can also apply topical treatments to the skin to help with your pain, including capsaicin cream or lidocaine cream or gel.  Use as directed on the label.  These are available without a prescription.  Start to stretch as your pain allows.  I have provided some stretches along with your discharge paperwork.  If you develop new or worsening symptoms, including if you lose control and pee or poop on yourself, if you develop a rash over the area, if you develop blood in your urine or burning when you pee, or vaginal discharge, or other concerning symptoms, please return to the emergency department for re-evaluation.

## 2017-07-05 NOTE — ED Provider Notes (Signed)
[facility]-EMERGENCY DEPT Provider Note   CSN: [id number] Arrival date & time: 07/05/17  1319     History   Chief Complaint Chief Complaint  Patient presents with  . Back Pain    HPI Deanna [name] is a 32 y.o. female with a h/o of obesity who presents to the emergency department with non-radiating, constant, sharp right-sided low back pain that began yesterday after she helped a friend move.  She states that she has had pulled muscles before, and this feels similar.  She denies dysuria, vaginal pain or discharge, weakness, numbness, urinary or fecal incontinence, fever, chills, rash, left-sided low back pain, abdominal pain, nausea, vomiting, or diarrhea.  She treated her symptoms with 400 mg of ibuprofen yesterday with no improvement.  No history of DM or IV drug use.  The history is provided by the patient. No language interpreter was used.  Back Pain   This is a new problem. The current episode started yesterday. Associated with: lifting furniture. The pain is present in the lumbar spine. The quality of the pain is described as shooting. The pain does not radiate. The pain is moderate. The symptoms are aggravated by certain positions. The pain is the same all the time. Pertinent negatives include no chest pain, no fever, no numbness, no abdominal pain, no bowel incontinence, no bladder incontinence, no dysuria, no paresthesias, no paresis, no tingling and no weakness. She has tried NSAIDs for the symptoms. The treatment provided no relief. Risk factors include obesity.    Past Medical History:  Diagnosis Date  . Abnormal Pap smear   . Infection    UTI  . Normal pregnancy 10/04/2011  . SVD (spontaneous vaginal delivery) 10/04/2011  . Trichomonas   . Vaginal Pap smear, abnormal     Patient Active Problem List   Diagnosis Date Noted  . Obesity 10/12/2013    Past Surgical History:  Procedure Laterality Date  . LEEP    . NO PAST SURGERIES      OB  History    Gravida Para Term Preterm AB Living   2 2 2  0 0 2   SAB TAB Ectopic Multiple Live Births   0 0 0 0 2       Home Medications    Prior to Admission medications   Medication Sig Start Date End Date Taking? Authorizing Provider  acetaminophen (TYLENOL) 325 MG tablet Take 650 mg by mouth every 6 (six) hours as needed for mild pain or headache.     [provider]  cyclobenzaprine (FLEXERIL) 10 MG tablet Take 1 tablet (10 mg total) by mouth 2 (two) times daily as needed for muscle spasms. 07/05/17   Tarique Loveall A, PA-C  ibuprofen (ADVIL,MOTRIN) 800 MG tablet Take 1 tablet (800 mg total) by mouth 3 (three) times daily. 07/05/17   Ica Daye A, PA-C  naproxen sodium (ALEVE) 220 MG tablet Take 1 tablet (220 mg total) by mouth 2 (two) times daily with a meal. 12/07/15   Donette LarryBhambri, Melanie, CNM  nitrofurantoin, macrocrystal-monohydrate, (MACROBID) 100 MG capsule Take 1 capsule (100 mg total) by mouth 2 (two) times daily. 12/07/15   Donette LarryBhambri, Melanie, CNM  norethindrone-ethinyl estradiol (JUNEL 1/20) 1-20 MG-MCG tablet Take 2 tablets by mouth daily. 12/07/15   Donette LarryBhambri, Melanie, CNM  OVER THE COUNTER MEDICATION Take 1 tablet by mouth daily. Over the counter daily vitamin "Hair, Skin, & Nails" made by [employer].    [provider]    Family History Family  History  Problem Relation Age of Onset  . Diabetes Maternal Grandfather   . Hypertension Mother   . Hypertension Father   . Diabetes Sister   . Hypertension Sister   . Anesthesia problems Neg Hx   . Hypotension Neg Hx   . Malignant hyperthermia Neg Hx   . Pseudochol deficiency Neg Hx     Social History Social History   Tobacco Use  . Smoking status: Never Smoker  . Smokeless tobacco: Never Used  Substance Use Topics  . Alcohol use: Yes    Comment: occassionally  . Drug use: No     Allergies   Patient has no known allergies.   Review of Systems Review of Systems  Constitutional: Negative for  activity change, chills and fever.  Respiratory: Negative for shortness of breath.   Cardiovascular: Negative for chest pain.  Gastrointestinal: Negative for abdominal pain, bowel incontinence, diarrhea, nausea and vomiting.  Genitourinary: Negative for bladder incontinence, dysuria, hematuria, vaginal discharge and vaginal pain.  Musculoskeletal: Positive for back pain and myalgias. Negative for gait problem, neck pain and neck stiffness.  Skin: Negative for rash.  Allergic/Immunologic: Negative for immunocompromised state.  Neurological: Negative for tingling, weakness, numbness and paresthesias.     Physical Exam Updated Vital Signs BP (!) 157/98 (BP Location: Right Arm)   Pulse 82   Temp 97.9 F (36.6 C) (Oral)   Resp 18   SpO2 99%   Physical Exam  Constitutional: No distress.  Obese female  HENT:  Head: Normocephalic.  Eyes: Conjunctivae are normal.  Neck: Neck supple.  Cardiovascular: Normal rate and regular rhythm. Exam reveals no gallop and no friction rub.  No murmur heard. Pulmonary/Chest: Effort normal. No respiratory distress.  Abdominal: Soft. She exhibits no distension.  Musculoskeletal:  Tender to palpation over the musculature of the right lumbar spine.  No tenderness to palpation to the muscles of the left lumbar spine.  No cervical, thoracic, or lumbar spinous process tenderness.  Symmetric tandem gait.  5 out of 5 strength against resistance of the bilateral lower extremities.  Sensation is intact throughout.  DP PT pulses are 2+ and symmetric.  No overlying ecchymosis, erythema, edema, warmth, or rash.  Neurological: She is alert.  Skin: Skin is warm. No rash noted.  Psychiatric: Her behavior is normal.  Nursing note and vitals reviewed.    ED Treatments / Results  Labs (all labs ordered are listed, but only abnormal results are displayed) Labs Reviewed - No data to display  EKG  EKG Interpretation None       Radiology No results  found.  Procedures Procedures (including critical care time)  Medications Ordered in ED Medications  cyclobenzaprine (FLEXERIL) tablet 10 mg (not administered)  ketorolac (TORADOL) injection 60 mg (not administered)     Initial Impression / Assessment and Plan / ED Course  I have reviewed the triage vital signs and the nursing notes.  Pertinent labs & imaging results that were available during my care of the patient were reviewed by me and considered in my medical decision making (see chart for details).     Patient with back pain, mechanical in nature.  No neurological deficits and normal neuro exam.  Patient can walk but states it is painful.  No urinary or bowel incontinence.  No concern for cauda equina, infection, AAA, infection, or fracture.  No constitutional symptoms including fever, chills, or weight loss,  No h/o cancer, IVDU.  Will discharge to home with RICE protocol and anti-inflammatory medicine.  Discussed ED return precaution and indications for PCP follow up. The patient acknowledges the plan and is agreeable at this time.    Final Clinical Impressions(s) / ED Diagnoses   Final diagnoses:  Acute myofascial strain of lumbar region, initial encounter    ED Discharge Orders        Ordered    cyclobenzaprine (FLEXERIL) 10 MG tablet  2 times daily PRN     07/05/17 1850    ibuprofen (ADVIL,MOTRIN) 800 MG tablet  3 times daily     07/05/17 1850       Yu Peggs, Coral Else, PA-C 07/05/17 [address], MD 07/05/17 2230

## 2017-10-09 ENCOUNTER — Other Ambulatory Visit (HOSPITAL_COMMUNITY)
Admission: RE | Admit: 2017-10-09 | Discharge: 2017-10-09 | Disposition: A | Discharge status: Home / Self Care | Payer: Commercial Managed Care - PPO | Source: Ambulatory Visit | Attending: Obstetrics and Gynecology | Admitting: Obstetrics and Gynecology

## 2017-10-09 ENCOUNTER — Ambulatory Visit (INDEPENDENT_AMBULATORY_CARE_PROVIDER_SITE_OTHER): Payer: Commercial Managed Care - PPO | Admitting: Obstetrics and Gynecology

## 2017-10-09 ENCOUNTER — Other Ambulatory Visit: Payer: Self-pay

## 2017-10-09 ENCOUNTER — Encounter: Payer: Self-pay | Admitting: Obstetrics and Gynecology

## 2017-10-09 VITALS — BP 122/80 | HR 76 | Resp 16 | Ht 66.0 in | Wt 298.0 lb

## 2017-10-09 DIAGNOSIS — Z113 Encounter for screening for infections with a predominantly sexual mode of transmission: Secondary | ICD-10-CM

## 2017-10-09 DIAGNOSIS — Z01419 Encounter for gynecological examination (general) (routine) without abnormal findings: Secondary | ICD-10-CM

## 2017-10-09 DIAGNOSIS — B373 Candidiasis of vulva and vagina: Secondary | ICD-10-CM | POA: Diagnosis not present

## 2017-10-09 DIAGNOSIS — N760 Acute vaginitis: Secondary | ICD-10-CM | POA: Insufficient documentation

## 2017-10-09 DIAGNOSIS — B9689 Other specified bacterial agents as the cause of diseases classified elsewhere: Secondary | ICD-10-CM | POA: Insufficient documentation

## 2017-10-09 MED ORDER — NYSTATIN 100000 UNIT/GM EX POWD
Freq: Three times a day (TID) | CUTANEOUS | 2 refills | Status: DC
Start: 2017-10-09 — End: 2017-11-04

## 2017-10-09 NOTE — Progress Notes (Signed)
32 y.o. G44P2002 Married [place] American female here for annual exam.    Wants to get back on Depo Provera.  Stopped 4 months ago.  Declines future childbearing.   Hx bacterial vaginosis.  Wants testing for vaginitis.  Wants testing for gonorrhea and chlamydia.   Declines all blood work today.  Has redness under her breasts and wants tx.   Status post LEEP.  32 yo and 32 yo.  PCP: No PCP    Patient's last menstrual period was 09/30/2017.     Period Cycle (Days): 28 Period Duration (Days): 6 Period Pattern: Regular Menstrual Flow: Heavy Menstrual Control: Maxi pad Menstrual Control Change Freq (Hours): 2-3 Dysmenorrhea: (!) Severe Dysmenorrhea Symptoms: Cramping(back pain)     Sexually active: Yes.    The current method of family planning is none.    Exercising: No.  The patient does not participate in regular exercise at present. Smoker:  no  Health Maintenance: Pap:  10/12/13 Pap smear Negative History of abnormal Pap:  Yes, hx of LEEP, normal since per patient TDaP:  10/11/14 Gardasil:   no HIV: 03/23/14 Negative Hep C: never Screening Labs:  Discuss today   reports that she has never smoked. She has never used smokeless tobacco. She reports that she drinks alcohol. She reports that she does not use drugs.  Past Medical History:  Diagnosis Date  . Abnormal Pap smear   . Infection    UTI  . Normal pregnancy 10/04/2011  . SVD (spontaneous vaginal delivery) 10/04/2011  . Trichomonas   . Vaginal Pap smear, abnormal     Past Surgical History:  Procedure Laterality Date  . LEEP    . NO PAST SURGERIES    . TOOTH EXTRACTION      Current Outpatient Medications  Medication Sig Dispense Refill  . acetaminophen (TYLENOL) 325 MG tablet Take 650 mg by mouth every 6 (six) hours as needed for mild pain or headache.     . chlorhexidine (PERIDEX) 0.12 % solution SWISH 1/2 OUNCE TWICE A DAY AFTER BREAKFAST AND AT BEDTIME AFTER BRUSHING AND FLOSSING  0  . ibuprofen  (ADVIL,MOTRIN) 800 MG tablet Take 1 tablet (800 mg total) by mouth 3 (three) times daily. 21 tablet 0  . OVER THE COUNTER MEDICATION Take 1 tablet by mouth daily. Over the counter daily vitamin "Hair, Skin, & Nails" made by [employer].    . cyclobenzaprine (FLEXERIL) 10 MG tablet Take 1 tablet (10 mg total) by mouth 2 (two) times daily as needed for muscle spasms. (Patient not taking: Reported on 10/09/2017) 20 tablet 0  . naproxen sodium (ALEVE) 220 MG tablet Take 1 tablet (220 mg total) by mouth 2 (two) times daily with a meal. (Patient not taking: Reported on 10/09/2017) 20 tablet 0   No current facility-administered medications for this visit.     Family History  Problem Relation Age of Onset  . Diabetes Maternal Grandfather   . Hypertension Mother   . Diabetes Mother   . Hypertension Father   . Diabetes Sister   . Hypertension Sister   . Sarcoidosis Brother   . Anesthesia problems Neg Hx   . Hypotension Neg Hx   . Malignant hyperthermia Neg Hx   . Pseudochol deficiency Neg Hx     Review of Systems  Constitutional: Negative.   HENT: Negative.   Eyes: Negative.   Respiratory: Negative.   Cardiovascular: Negative.   Gastrointestinal: Negative.   Endocrine: Negative.   Genitourinary: Negative.   Musculoskeletal: Negative.  Skin: Negative.   Allergic/Immunologic: Negative.   Neurological: Negative.   Hematological: Negative.   Psychiatric/Behavioral: Negative.     Exam:   BP 122/80 (BP Location: Right Arm, Patient Position: Sitting, Cuff Size: Large)   Pulse 76   Resp 16   Ht  (1.676 m)   Wt 298 lb (135.2 kg)   LMP 09/30/2017   BMI 48.10 kg/m     General appearance: alert, cooperative and appears stated age Head: Normocephalic, without obvious abnormality, atraumatic Neck: no adenopathy, supple, symmetrical, trachea midline and thyroid normal to inspection and palpation Lungs: clear to auscultation bilaterally Breasts: normal appearance, no masses or  tenderness, No nipple retraction or dimpling, No nipple discharge or bleeding, No axillary or supraclavicular adenopathy Heart: regular rate and rhythm Abdomen: soft, non-tender; no masses, no organomegaly Extremities: extremities normal, atraumatic, no cyanosis or edema Skin: Skin color, texture, turgor normal. No rashes or lesions Lymph nodes: Cervical, supraclavicular, and axillary nodes normal. No abnormal inguinal nodes palpated Neurologic: Grossly normal  Pelvic: External genitalia:  no lesions              Urethra:  normal appearing urethra with no masses, tenderness or lesions              Bartholins and Skenes: normal                 Vagina: normal appearing vagina with normal color and discharge, no lesions              Cervix: no lesions              Pap taken: Yes.   Bimanual Exam:  Uterus:  normal size, contour, position, consistency, mobility, non-tender              Adnexa: no mass, fullness, tenderness            Chaperone was present for exam.  Assessment:   Well woman visit with normal exam. Dysmenorrhea. Hx LEEP.  STD screening.  Vaginitis.  Candida of flexural folds.   Plan: Mammogram screening. Recommended self breast awareness. Pap and HR HPV as above. Guidelines for Calcium, Vitamin D, regular exercise program including cardiovascular and weight bearing exercise. GC/CT/vaginitis testing from pap.  Return for Depo Provera with menses.  Ok for Depo Proera 150 mg Im q 3 months for one year. She declines blood work and serum STD screening.  Use corn starch for drying of the skin.  Nystatin powder for Candida. I discussed Gardasil. Follow up annually and prn.    After visit summary provided.

## 2017-10-09 NOTE — Patient Instructions (Signed)

## 2017-10-13 LAB — CYTOLOGY - PAP
Bacterial vaginitis: POSITIVE — AB
Candida vaginitis: POSITIVE — AB
Chlamydia: NEGATIVE
Diagnosis: NEGATIVE
HPV: NOT DETECTED
Neisseria Gonorrhea: NEGATIVE
Trichomonas: NEGATIVE

## 2017-10-15 ENCOUNTER — Other Ambulatory Visit: Payer: Self-pay | Admitting: *Deleted

## 2017-10-15 MED ORDER — FLUCONAZOLE 150 MG PO TABS: ORAL_TABLET | ORAL | 0 refills | Status: DC

## 2017-10-15 MED ORDER — METRONIDAZOLE 500 MG PO TABS: 500.0000 mg | ORAL_TABLET | Freq: Two times a day (BID) | ORAL | 0 refills | Status: DC

## 2017-10-20 ENCOUNTER — Telehealth: Payer: Self-pay | Admitting: Obstetrics and Gynecology

## 2017-10-20 NOTE — Telephone Encounter (Signed)
Spoke with patient. LMP 10/18/17, calling to schedule depo provera injection. Last AEX 10/09/17 -ok to proceed with depo provera with menses per Dr. Edward Jolly.   Nurse visit scheduled for 10/21/17 at 9:30am.   Routing to provider for final review. Patient is agreeable to disposition. Will close encounter.

## 2017-10-20 NOTE — Telephone Encounter (Signed)
Patient was told to call when she started he cycle to start her birth control.

## 2017-10-21 ENCOUNTER — Ambulatory Visit: Payer: Self-pay

## 2017-10-21 ENCOUNTER — Telehealth: Payer: Self-pay | Admitting: Obstetrics and Gynecology

## 2017-10-21 NOTE — Telephone Encounter (Signed)
Spoke with patient. Patient states she cancelled her depo provera injection for this morning because her menses stopped. Patient was unsure if she should proceed with depo because of her irregular cycles. Reports bleeding 5/8 and 5/26, bleeding has stopped, menses usually last 7 days.   Patient is SA.  Recommended patient take a UPT. Will review with Dr. Edward Jolly before proceeding with rescheduling depo provera. Patient is agreeable.   Dr. Edward Jolly -please review and advise?

## 2017-10-21 NOTE — Telephone Encounter (Signed)
Patient called stating that she has started her cycle and is unsure if she should come in today for her depo provera?

## 2017-10-22 NOTE — Telephone Encounter (Signed)
I agree with the pregnancy test.  If she is having irregular menses, she needs to return for an office visit to evaluate further prior to initiating Depo Provera. Depo needs to be given within the first 7 days of normal menses beginning. Bleeding starting on 10/18/17 would be sooner than expected for a normal period.

## 2017-10-22 NOTE — Telephone Encounter (Signed)
Patient returning Jill's call.  °

## 2017-10-22 NOTE — Telephone Encounter (Signed)
Spoke with patient, advised as seen below per Dr. Edward Jolly. OV scheduled for 5/31 at 3:30pm, patient declined OV for today. Patient verbalizes understanding.  Routing to provider for final review. Patient is agreeable to disposition. Will close encounter.

## 2017-10-22 NOTE — Telephone Encounter (Signed)
Left message to call Deanna [name] at [phone].  

## 2017-10-23 ENCOUNTER — Other Ambulatory Visit: Payer: Self-pay

## 2017-10-23 ENCOUNTER — Ambulatory Visit (INDEPENDENT_AMBULATORY_CARE_PROVIDER_SITE_OTHER): Payer: Commercial Managed Care - PPO | Admitting: Obstetrics and Gynecology

## 2017-10-23 ENCOUNTER — Encounter: Payer: Self-pay | Admitting: Obstetrics and Gynecology

## 2017-10-23 VITALS — BP 122/68 | HR 80 | Resp 18 | Wt 301.0 lb

## 2017-10-23 DIAGNOSIS — N926 Irregular menstruation, unspecified: Secondary | ICD-10-CM | POA: Diagnosis not present

## 2017-10-23 DIAGNOSIS — Z3042 Encounter for surveillance of injectable contraceptive: Secondary | ICD-10-CM | POA: Diagnosis not present

## 2017-10-23 LAB — POCT URINE PREGNANCY: Preg Test, Ur: NEGATIVE

## 2017-10-23 MED ORDER — MEDROXYPROGESTERONE ACETATE 150 MG/ML IM SUSP
150.0000 mg | Freq: Once | INTRAMUSCULAR | Status: AC
  Administered 2017-10-23: 150 mg via INTRAMUSCULAR

## 2017-10-23 NOTE — Progress Notes (Signed)
Patient is here for Depo Provera Injection Patient is within Depo Provera Calender Limits yes, first depo injection Next Depo Due between: 8/16-8/30 Last AEX: 10/09/17 BS AEX Scheduled: not scheduled   Patient is aware when next depo is due  Pt tolerated Injection well in RUOQ.  Routed to provider for review, encounter closed.

## 2017-10-23 NOTE — Progress Notes (Signed)
GYNECOLOGY  VISIT   HPI: 32 y.o.   Married  [place]  female   [phone] with Patient's last menstrual period was 10/18/2017.   here for     Interest to start Depo Provera.   Has irregular menses for one year.  LMP 09/30/17 and lasted 7 days.  Started again 10/19/17 and lasted 3 days.  Stopped her depo Provera 4 months ago and she does not have cycles when she is on it.  Was on Depo off and on since 2005.   Seen in [facility] 11/2015 for heavy bleeding 3 months post Mirena IUD removal.  Normal pelvic US in 2011.   UPT negative.  Last intercourse was about one month.   GYNECOLOGIC HISTORY: Patient's last menstrual period was 10/18/2017. Contraception:  none Menopausal hormone therapy:  n/a Last mammogram:  n/a Last pap smear:   10/09/17 Pap and HR HPV negative        OB History    Gravida  2   Para  2   Term  2   Preterm  0   AB  0   Living  2     SAB  0   TAB  0   Ectopic  0   Multiple  0   Live Births  2              Patient Active Problem List   Diagnosis Date Noted  . Obesity 10/12/2013    Past Medical History:  Diagnosis Date  . Abnormal Pap smear   . Infection    UTI  . Normal pregnancy 10/04/2011  . SVD (spontaneous vaginal delivery) 10/04/2011  . Trichomonas   . Vaginal Pap smear, abnormal     Past Surgical History:  Procedure Laterality Date  . LEEP    . NO PAST SURGERIES    . TOOTH EXTRACTION      Current Outpatient Medications  Medication Sig Dispense Refill  . acetaminophen (TYLENOL) 325 MG tablet Take 650 mg by mouth every 6 (six) hours as needed for mild pain or headache.     . cyclobenzaprine (FLEXERIL) 10 MG tablet Take 1 tablet (10 mg total) by mouth 2 (two) times daily as needed for muscle spasms. 20 tablet 0  . fluconazole (DIFLUCAN) 150 MG tablet Take 150 mg by mouth daily.    Marland Kitchen ibuprofen (ADVIL,MOTRIN) 800 MG tablet Take 1 tablet (800 mg total) by mouth 3 (three) times daily. 21 tablet 0  . metroNIDAZOLE (FLAGYL) 500  MG tablet Take 1 tablet (500 mg total) by mouth 2 (two) times daily. 14 tablet 0  . nystatin (MYCOSTATIN/NYSTOP) powder Apply topically 3 (three) times daily. Apply to affected area for up to 7 days 30 g 2  . OVER THE COUNTER MEDICATION Take 1 tablet by mouth daily. Over the counter daily vitamin "Hair, Skin, & Nails" made by [employer].     No current facility-administered medications for this visit.      ALLERGIES: Patient has no known allergies.  Family History  Problem Relation Age of Onset  . Diabetes Maternal Grandfather   . Hypertension Mother   . Diabetes Mother   . Hypertension Father   . Diabetes Sister   . Hypertension Sister   . Sarcoidosis Brother   . Anesthesia problems Neg Hx   . Hypotension Neg Hx   . Malignant hyperthermia Neg Hx   . Pseudochol deficiency Neg Hx     Social History   Socioeconomic History  . Marital  status: Married    Spouse name: Not on file  . Number of children: Not on file  . Years of education: Not on file  . Highest education level: Not on file  Occupational History  . Not on file  Social Needs  . Financial resource strain: Not on file  . Food insecurity:    Worry: Not on file    Inability: Not on file  . Transportation needs:    Medical: Not on file    Non-medical: Not on file  Tobacco Use  . Smoking status: Never Smoker  . Smokeless tobacco: Never Used  Substance and Sexual Activity  . Alcohol use: Yes    Comment: occassionally  . Drug use: No  . Sexual activity: Yes    Birth control/protection: None  Lifestyle  . Physical activity:    Days per week: Not on file    Minutes per session: Not on file  . Stress: Not on file  Relationships  . Social connections:    Talks on phone: Not on file    Gets together: Not on file    Attends religious service: Not on file    Active member of club or organization: Not on file    Attends meetings of clubs or organizations: Not on file    Relationship status: Not on file  .  Intimate partner violence:    Fear of current or ex partner: Not on file    Emotionally abused: Not on file    Physically abused: Not on file    Forced sexual activity: Not on file  Other Topics Concern  . Not on file  Social History Narrative   Lives with husband, son, and daughter   Gets to appointments in her car   No advanced directive    Sister and husband may make medical decisions if unable    No exercise    Review of Systems  PHYSICAL EXAMINATION:    BP 122/68 (BP Location: Right Arm, Patient Position: Sitting, Cuff Size: Large)   Pulse 80   Resp 18   Wt (!) 301 lb (136.5 kg)   LMP 10/18/2017   BMI 48.58 kg/m     General appearance: alert, cooperative and appears stated age  Pelvic: External genitalia:  no lesions              Urethra:  normal appearing urethra with no masses, tenderness or lesions              Bartholins and Skenes: normal                 Vagina: normal appearing vagina with normal color and discharge, no lesions              Cervix: no lesions                Bimanual Exam:  Uterus:  normal size, contour, position, consistency, mobility, non-tender              Adnexa: no mass, fullness, tenderness                Chaperone was present for exam.  ASSESSMENT  Irregular menses.  Probable anovulatory bleeding.  Recent discontinuation of Depo Provera.  Desire for long acting contraception.   PLAN  Depo Provera 150 mg IM q 3 months for one year.    An After Visit Summary was printed and given to the patient.  ___15___ minutes face to face time of which over  50% was spent in counseling.

## 2017-10-30 ENCOUNTER — Ambulatory Visit: Payer: Commercial Managed Care - PPO | Admitting: Obstetrics and Gynecology

## 2017-10-30 NOTE — Progress Notes (Deleted)
GYNECOLOGY  VISIT   HPI: 32 y.o.   Married  {Race/ethnicity:[id number]}  female   [id number] with Patient's last menstrual period was 10/18/2017.   here for   Std testing  GYNECOLOGIC HISTORY: Patient's last menstrual period was 10/18/2017. Contraception:  Depo provera Menopausal hormone therapy:  none Last mammogram:  none Last pap smear:   10-09-17 neg HPV HR neg        OB History    Gravida  2   Para  2   Term  2   Preterm  0   AB  0   Living  2     SAB  0   TAB  0   Ectopic  0   Multiple  0   Live Births  2              Patient Active Problem List   Diagnosis Date Noted  . Obesity 10/12/2013    Past Medical History:  Diagnosis Date  . Abnormal Pap smear   . Infection    UTI  . Normal pregnancy 10/04/2011  . SVD (spontaneous vaginal delivery) 10/04/2011  . Trichomonas   . Vaginal Pap smear, abnormal     Past Surgical History:  Procedure Laterality Date  . LEEP    . NO PAST SURGERIES    . TOOTH EXTRACTION      Current Outpatient Medications  Medication Sig Dispense Refill  . acetaminophen (TYLENOL) 325 MG tablet Take 650 mg by mouth every 6 (six) hours as needed for mild pain or headache.     . cyclobenzaprine (FLEXERIL) 10 MG tablet Take 1 tablet (10 mg total) by mouth 2 (two) times daily as needed for muscle spasms. 20 tablet 0  . fluconazole (DIFLUCAN) 150 MG tablet Take 150 mg by mouth daily.    Marland Kitchen. ibuprofen (ADVIL,MOTRIN) 800 MG tablet Take 1 tablet (800 mg total) by mouth 3 (three) times daily. 21 tablet 0  . metroNIDAZOLE (FLAGYL) 500 MG tablet Take 1 tablet (500 mg total) by mouth 2 (two) times daily. 14 tablet 0  . nystatin (MYCOSTATIN/NYSTOP) powder Apply topically 3 (three) times daily. Apply to affected area for up to 7 days 30 g 2  . OVER THE COUNTER MEDICATION Take 1 tablet by mouth daily. Over the counter daily vitamin "Hair, Skin, & Nails" made by [employer].     No current facility-administered medications for this visit.       ALLERGIES: Patient has no known allergies.  Family History  Problem Relation Age of Onset  . Diabetes Maternal Grandfather   . Hypertension Mother   . Diabetes Mother   . Hypertension Father   . Diabetes Sister   . Hypertension Sister   . Sarcoidosis Brother   . Anesthesia problems Neg Hx   . Hypotension Neg Hx   . Malignant hyperthermia Neg Hx   . Pseudochol deficiency Neg Hx     Social History   Socioeconomic History  . Marital status: Married    Spouse name: Not on file  . Number of children: Not on file  . Years of education: Not on file  . Highest education level: Not on file  Occupational History  . Not on file  Social Needs  . Financial resource strain: Not on file  . Food insecurity:    Worry: Not on file    Inability: Not on file  . Transportation needs:    Medical: Not on file    Non-medical: Not on file  Tobacco Use  . Smoking status: Never Smoker  . Smokeless tobacco: Never Used  Substance and Sexual Activity  . Alcohol use: Yes    Comment: occassionally  . Drug use: No  . Sexual activity: Yes    Birth control/protection: None  Lifestyle  . Physical activity:    Days per week: Not on file    Minutes per session: Not on file  . Stress: Not on file  Relationships  . Social connections:    Talks on phone: Not on file    Gets together: Not on file    Attends religious service: Not on file    Active member of club or organization: Not on file    Attends meetings of clubs or organizations: Not on file    Relationship status: Not on file  . Intimate partner violence:    Fear of current or ex partner: Not on file    Emotionally abused: Not on file    Physically abused: Not on file    Forced sexual activity: Not on file  Other Topics Concern  . Not on file  Social History Narrative   Lives with husband, son, and daughter   Gets to appointments in her car   No advanced directive    Sister and husband may make medical decisions if unable    No  exercise    Review of Systems  PHYSICAL EXAMINATION:    LMP 10/18/2017     General appearance: alert, cooperative and appears stated age Head: Normocephalic, without obvious abnormality, atraumatic Neck: no adenopathy, supple, symmetrical, trachea midline and thyroid normal to inspection and palpation Lungs: clear to auscultation bilaterally Breasts: normal appearance, no masses or tenderness, No nipple retraction or dimpling, No nipple discharge or bleeding, No axillary or supraclavicular adenopathy Heart: regular rate and rhythm Abdomen: soft, non-tender, no masses,  no organomegaly Extremities: extremities normal, atraumatic, no cyanosis or edema Skin: Skin color, texture, turgor normal. No rashes or lesions Lymph nodes: Cervical, supraclavicular, and axillary nodes normal. No abnormal inguinal nodes palpated Neurologic: Grossly normal  Pelvic: External genitalia:  no lesions              Urethra:  normal appearing urethra with no masses, tenderness or lesions              Bartholins and Skenes: normal                 Vagina: normal appearing vagina with normal color and discharge, no lesions              Cervix: no lesions                Bimanual Exam:  Uterus:  normal size, contour, position, consistency, mobility, non-tender              Adnexa: no mass, fullness, tenderness              Rectal exam: {yes no:314532}.  Confirms.              Anus:  normal sphincter tone, no lesions  Chaperone was present for exam.  ASSESSMENT     PLAN     An After Visit Summary was printed and given to the patient.  ______ minutes face to face time of which over 50% was spent in counseling.

## 2017-11-04 ENCOUNTER — Other Ambulatory Visit: Payer: Self-pay

## 2017-11-04 ENCOUNTER — Encounter: Payer: Self-pay | Admitting: Obstetrics and Gynecology

## 2017-11-04 ENCOUNTER — Ambulatory Visit (INDEPENDENT_AMBULATORY_CARE_PROVIDER_SITE_OTHER): Payer: Commercial Managed Care - PPO | Admitting: Obstetrics and Gynecology

## 2017-11-04 VITALS — BP 118/76 | HR 80 | Resp 16 | Ht 66.0 in | Wt 300.0 lb

## 2017-11-04 DIAGNOSIS — Z202 Contact with and (suspected) exposure to infections with a predominantly sexual mode of transmission: Secondary | ICD-10-CM | POA: Diagnosis not present

## 2017-11-04 DIAGNOSIS — Z113 Encounter for screening for infections with a predominantly sexual mode of transmission: Secondary | ICD-10-CM | POA: Diagnosis not present

## 2017-11-04 NOTE — Progress Notes (Signed)
GYNECOLOGY  VISIT   HPI: 32 y.o.   Married  [place]   [phone] with Patient's last menstrual period was 10/18/2017.   here for  STD testing.  Husband has tested positive for syphilis.  He was tested at the time of blood donation.  He is being treated at [facility] and will complete 3 injections.   They are not currently sexually active.   Denies rashes, sores, headaches, or not feeling well.   Status post Depo Provera 10/23/17.   Has had negative GC/CT/trichomonas testing 10/09/17.  GYNECOLOGIC HISTORY: Patient's last menstrual period was 10/18/2017. Contraception: Depo Provera Menopausal hormone therapy:  none Last mammogram:  n/a Last pap smear:   10/09/17 pap and HR HPV negative        OB History    Gravida  2   Para  2   Term  2   Preterm  0   AB  0   Living  2     SAB  0   TAB  0   Ectopic  0   Multiple  0   Live Births  2              Patient Active Problem List   Diagnosis Date Noted  . Obesity 10/12/2013    Past Medical History:  Diagnosis Date  . Abnormal Pap smear   . Infection    UTI  . Normal pregnancy 10/04/2011  . SVD (spontaneous vaginal delivery) 10/04/2011  . Trichomonas   . Vaginal Pap smear, abnormal     Past Surgical History:  Procedure Laterality Date  . LEEP    . NO PAST SURGERIES    . TOOTH EXTRACTION      Current Outpatient Medications  Medication Sig Dispense Refill  . medroxyPROGESTERone (DEPO-PROVERA) 150 MG/ML injection Inject 150 mg into the muscle every 3 (three) months.    Marland Kitchen. OVER THE COUNTER MEDICATION Take 1 tablet by mouth daily. Over the counter daily vitamin "Hair, Skin, & Nails" made by [employer].     No current facility-administered medications for this visit.      ALLERGIES: Patient has no known allergies.  Family History  Problem Relation Age of Onset  . Diabetes Maternal Grandfather   . Hypertension Mother   . Diabetes Mother   . Hypertension Father   . Diabetes  Sister   . Hypertension Sister   . Sarcoidosis Brother   . Anesthesia problems Neg Hx   . Hypotension Neg Hx   . Malignant hyperthermia Neg Hx   . Pseudochol deficiency Neg Hx     Social History   Socioeconomic History  . Marital status: Married    Spouse name: Not on file  . Number of children: Not on file  . Years of education: Not on file  . Highest education level: Not on file  Occupational History  . Not on file  Social Needs  . Financial resource strain: Not on file  . Food insecurity:    Worry: Not on file    Inability: Not on file  . Transportation needs:    Medical: Not on file    Non-medical: Not on file  Tobacco Use  . Smoking status: Never Smoker  . Smokeless tobacco: Never Used  Substance and Sexual Activity  . Alcohol use: Yes    Comment: occassionally  . Drug use: No  . Sexual activity: Yes    Birth control/protection: None  Lifestyle  . Physical activity:  Days per week: Not on file    Minutes per session: Not on file  . Stress: Not on file  Relationships  . Social connections:    Talks on phone: Not on file    Gets together: Not on file    Attends religious service: Not on file    Active member of club or organization: Not on file    Attends meetings of clubs or organizations: Not on file    Relationship status: Not on file  . Intimate partner violence:    Fear of current or ex partner: Not on file    Emotionally abused: Not on file    Physically abused: Not on file    Forced sexual activity: Not on file  Other Topics Concern  . Not on file  Social History Narrative   Lives with husband, son, and daughter   Gets to appointments in her car   No advanced directive    Sister and husband may make medical decisions if unable    No exercise    Review of Systems  Constitutional: Negative.   HENT: Negative.   Eyes: Negative.   Respiratory: Negative.   Cardiovascular: Negative.   Gastrointestinal: Negative.   Endocrine: Negative.    Genitourinary: Negative.   Musculoskeletal: Negative.   Skin: Negative.   Allergic/Immunologic: Negative.   Neurological: Negative.   Hematological: Negative.   Psychiatric/Behavioral: Negative.     PHYSICAL EXAMINATION:    BP 118/76 (BP Location: Right Arm, Patient Position: Sitting, Cuff Size: Large)   Pulse 80   Resp 16   Ht 5\' 6"  (1.676 m)   Wt 300 lb (136.1 kg)   LMP 10/18/2017   BMI 48.42 kg/m     General appearance: alert, cooperative and appears stated age  ASSESSMENT  STD screening.  Exposure to syphilis.  PLAN  We discussed syphilis - transmission, signs and symptoms, and treatment due to exposure.  PCN G Benzathine, 2.4 million units anticipated.  Will do serum STD screening now with tx to follow.  Written information on syphilis to patient.   An After Visit Summary was printed and given to the patient.  ___15___ minutes face to face time of which over 50% was spent in counseling.

## 2017-11-04 NOTE — Patient Instructions (Signed)
Syphilis Syphilis is an infectious disease. It can cause serious complications if left untreated. What are the causes? Syphilis is caused by a type of bacteria called Treponema pallidum. It is most commonly spread through sexual contact. Syphilis may also spread to a fetus through the blood of the mother. What are the signs or symptoms? Symptoms vary depending on the stage of the disease. Some symptoms may disappear without treatment. However, this does not mean that the infection is gone. One form of syphilis (called latent syphilis) has no symptoms. Primary Syphilis  Painless sores (chancres) in and around the genital organs and mouth.  Swollen lymph nodes near the sores. Secondary Syphilis  A rash or sores over any portion of the body, including the palms of the hands and soles of the feet.  Fever.  Headache.  Sore throat.  Swollen lymph nodes.  New sores in the mouth or on the genitals.  Feeling generally ill.  Having pain in the joints. Tertiary Syphilis The third stage of syphilis involves severe damage to different organs in the body, such as the brain, spinal cord, and heart. Signs and symptoms may include:  Dementia.  Personality and mood changes.  Difficulty walking.  Heart failure.  Fainting.  Enlargement (aneurysm) of the aorta.  Tumors of the skin, bones, or liver.  Muscle weakness.  Sudden "lightning" pains, numbness, or tingling.  Problems with coordination.  Vision changes.  How is this diagnosed?  A physical exam will be done.  Blood tests will be done to confirm the diagnosis.  If the disease is in the first or second stages, a fluid (drainage) sample from a sore or rash may be examined under a microscope to detect the disease-causing bacteria.  Fluid around the spine may need to be examined to detect brain damage or inflammation of the brain lining (meningitis).  If the disease is in the third stage, X-rays, CT scans, MRIs,  echocardiograms, ultrasounds, or cardiac catheterization may also be done to detect disease of the heart, aorta, or brain. How is this treated? Syphilis can be cured with antibiotic medicine if a diagnosis is made early. During the first day of treatment, you may experience fever, chills, headache, nausea, or aching all over your body. This is a normal reaction to the antibiotics. Follow these instructions at home:  Take your antibiotic medicine as directed by your health care provider. Finish the antibiotic even if you start to feel better. Incomplete treatment will put you at risk for continued infection and could be life threatening.  Take medicines only as directed by your health care provider.  Do not have sexual intercourse until your treatment is completed or as directed by your health care provider.  Inform your recent sexual partners that you were diagnosed with syphilis. They need to seek care and treatment, even if they have no symptoms. It is necessary that all your sexual partners be tested for infection and treated if they have the disease.  Keep all follow-up visits as directed by your health care provider. It is important to keep all your appointments.  If your test results are not ready during your visit, make an appointment with your health care provider to find out the results. Do not assume everything is normal if you have not heard from your health care provider or the medical facility. It is your responsibility to get your test results. Contact a health care provider if:  You continue to have any of the following 24 hours after beginning   treatment: ? Fever. ? Chills. ? Headache. ? Nausea. ? Aching all over your body.  You have symptoms of an allergic reaction to medicine, such as: ? Chills. ? A headache. ? Light-headedness. ? A new rash (especially hives). ? Difficulty breathing. This information is not intended to replace advice given to you by your health care  provider. Make sure you discuss any questions you have with your health care provider. Document Released: 03/02/2013 Document Revised: 10/18/2015 Document Reviewed: 11/30/2012 Elsevier Interactive Patient Education  2017 [employer] Inc.  

## 2017-11-06 LAB — HSV(HERPES SIMPLEX VRS) I + II AB-IGG
HSV 1 Glycoprotein G Ab, IgG: 40.9 index — ABNORMAL HIGH (ref 0.00–0.90)
HSV 2 IgG, Type Spec: <0.91 index (ref 0.00–0.90)

## 2017-11-06 LAB — HEP, RPR, HIV PANEL
HIV Screen 4th Generation wRfx: NONREACTIVE
Hepatitis B Surface Ag: NEGATIVE
RPR Ser Ql: NONREACTIVE

## 2017-11-06 LAB — HEPATITIS C ANTIBODY: Hep C Virus Ab: <0.1 s/co ratio (ref 0.0–0.9)

## 2017-11-06 LAB — HSV(HERPES SIMPLEX VRS) I + II AB-IGM: HSVI/II Comb IgM: <0.91 Ratio (ref 0.00–0.90)

## 2017-11-11 ENCOUNTER — Telehealth: Payer: Self-pay | Admitting: Obstetrics and Gynecology

## 2017-11-11 NOTE — Telephone Encounter (Signed)
Patient is calling for recent lab results °

## 2017-11-11 NOTE — Telephone Encounter (Signed)
Spoke with patient. Results given. Patinet verbalizes understanding. Aware [facility] and return call. Patient is agreeable.   [facility].  Notes recorded by Patton SallesAmundson C Silva, Brook E, MD on 11/09/2017 at 3:30 PM EDT Please report results to patient.  Her testing is negative for HIV, syphilis, hepatitis B and C.  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  She is negative for herpes type 2, anogenital herpes.  By protocol, the patient needs treatment with PCN G benzathine 2.4 million units due to her reported exposure to syphilis.  She will need to go to the health department for this medication and to be followed.   Cc- Billie RuddySally Yeakley

## 2017-11-12 NOTE — Telephone Encounter (Signed)
Spoke with Cherae at the [facility] who requests OV note, demographics, and labs be faxed to her attention at [phone]. All records faxed.Cherae will call to schedule the patient an appointment.  Left message for patient to call Kaitlyn at [phone]

## 2017-12-14 NOTE — Telephone Encounter (Signed)
Okay to close encounter?  °

## 2017-12-14 NOTE — Telephone Encounter (Signed)
Sp[facility] with [facility] [facility] [facility]. Patient treated with one dose of Pen G on 11/24/17. [place] Per Mil[place]andy, only needs one dose due to exposure only. She is to follow up with the health department in 3 months for another RPR.   Routing to Dr. Edward JollySilva for update and will close encounter.

## 2018-01-07 NOTE — Progress Notes (Signed)
Patient is here for Depo Provera Injection Patient is within Depo Provera Calender Limits 8/16-8/30 Next Depo Due between: 11/1-11/15 Last AEX: 10-09-17 AEX Scheduled: not yet scheduled  Patient is aware when next depo is due  Pt tolerated Injection well in LUOQ  Routed to provider for review, encounter closed.

## 2018-01-08 ENCOUNTER — Ambulatory Visit (INDEPENDENT_AMBULATORY_CARE_PROVIDER_SITE_OTHER): Payer: Commercial Managed Care - PPO

## 2018-01-08 VITALS — BP 120/82 | HR 70 | Resp 16 | Ht 66.0 in | Wt 305.0 lb

## 2018-01-08 DIAGNOSIS — Z3042 Encounter for surveillance of injectable contraceptive: Secondary | ICD-10-CM

## 2018-01-08 MED ORDER — MEDROXYPROGESTERONE ACETATE 150 MG/ML IM SUSP
150.0000 mg | Freq: Once | INTRAMUSCULAR | Status: AC
  Administered 2018-01-08: 150 mg via INTRAMUSCULAR

## 2018-03-10 ENCOUNTER — Emergency Department (HOSPITAL_COMMUNITY)
Admission: EM | Admit: 2018-03-10 | Discharge: 2018-03-10 | Disposition: A | Discharge status: Home / Self Care | Payer: Commercial Managed Care - PPO | Attending: Emergency Medicine | Admitting: Emergency Medicine

## 2018-03-10 ENCOUNTER — Other Ambulatory Visit: Payer: Self-pay

## 2018-03-10 ENCOUNTER — Encounter (HOSPITAL_COMMUNITY): Payer: Self-pay

## 2018-03-10 ENCOUNTER — Emergency Department (HOSPITAL_COMMUNITY): Payer: Commercial Managed Care - PPO

## 2018-03-10 DIAGNOSIS — I4891 Unspecified atrial fibrillation: Secondary | ICD-10-CM | POA: Diagnosis present

## 2018-03-10 DIAGNOSIS — Z79899 Other long term (current) drug therapy: Secondary | ICD-10-CM | POA: Insufficient documentation

## 2018-03-10 DIAGNOSIS — R0789 Other chest pain: Secondary | ICD-10-CM | POA: Diagnosis not present

## 2018-03-10 LAB — CBC
HCT: 45.2 % (ref 36.0–46.0)
Hemoglobin: 14.2 g/dL (ref 12.0–15.0)
MCH: 27.8 pg (ref 26.0–34.0)
MCHC: 31.4 g/dL (ref 30.0–36.0)
MCV: 88.6 fL (ref 80.0–100.0)
Platelets: 249 10*3/uL (ref 150–400)
RBC: 5.1 MIL/uL (ref 3.87–5.11)
RDW: 14.6 % (ref 11.5–15.5)
WBC: 7 10*3/uL (ref 4.0–10.5)
nRBC: 0 % (ref 0.0–0.2)

## 2018-03-10 LAB — TSH: TSH: 2.667 u[IU]/mL (ref 0.350–4.500)

## 2018-03-10 LAB — BASIC METABOLIC PANEL
Anion gap: 7 (ref 5–15)
BUN: 9 mg/dL (ref 6–20)
CO2: 23 mmol/L (ref 22–32)
Calcium: 9.1 mg/dL (ref 8.9–10.3)
Chloride: 110 mmol/L (ref 98–111)
Creatinine, Ser: 0.88 mg/dL (ref 0.44–1.00)
GFR calc Af Amer: >60 mL/min (ref >60)
GFR calc non Af Amer: >60 mL/min (ref >60)
Glucose, Bld: 121 mg/dL — ABNORMAL HIGH (ref 70–99)
Potassium: 4.2 mmol/L (ref 3.5–5.1)
Sodium: 140 mmol/L (ref 135–145)

## 2018-03-10 LAB — I-STAT BETA HCG BLOOD, ED (MC, WL, AP ONLY): I-stat hCG, quantitative: <5 m[IU]/mL (ref <5)

## 2018-03-10 LAB — MAGNESIUM: Magnesium: 1.9 mg/dL (ref 1.7–2.4)

## 2018-03-10 MED ORDER — APIXABAN 5 MG PO TABS
5.0000 mg | ORAL_TABLET | Freq: Once | ORAL | Status: AC
  Administered 2018-03-10: 5 mg via ORAL
  Filled 2018-03-10 (×2): qty 1

## 2018-03-10 MED ORDER — APIXABAN 5 MG PO TABS: 5.0000 mg | ORAL_TABLET | Freq: Two times a day (BID) | ORAL | 0 refills | Status: DC

## 2018-03-10 MED ORDER — SODIUM CHLORIDE 0.9 % IV BOLUS
1000.0000 mL | Freq: Once | INTRAVENOUS | Status: AC
  Administered 2018-03-10: 1000 mL via INTRAVENOUS

## 2018-03-10 MED ORDER — PROPOFOL 10 MG/ML IV BOLUS
INTRAVENOUS | Status: AC | PRN
  Administered 2018-03-10: 10 mg via INTRAVENOUS

## 2018-03-10 MED ORDER — PROPOFOL 10 MG/ML IV BOLUS
1.0000 mg/kg | Freq: Once | INTRAVENOUS | Status: DC
  Filled 2018-03-10: qty 20

## 2018-03-10 MED FILL — ELIQUIS 5 MG TABLET: 5 | 30 days supply | Qty: 60 | Fill #0

## 2018-03-10 NOTE — ED Provider Notes (Addendum)
[facility] EMERGENCY DEPARTMENT Provider Note   CSN: [id number] Arrival date & time: 03/10/18  1214     History   Chief Complaint Chief Complaint  Patient presents with  . Irregular Heart Beat  . Chest Pain    HPI Deanna [name] is a 32 y.o. female.  HPI  32 year old female presents with new onset A. fib.  She has been feeling normal prior to this morning.  Woke up feeling fine this morning.  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  She is been having lightheadedness especially with standing.  She has never had this before.  No shortness of breath.  No recent leg swelling.  She denies smoking or illicit drug use but does drink about 5 or 6 shots every other day of alcohol.  More on the weekends or when going out.  None today.  She drinks about 12 ounce bottle of soda but no other significant caffeine.  No recent vomiting.  EMS gave IV metoprolol with partial decrease of the tachycardia.  Past Medical History:  Diagnosis Date  . Abnormal Pap smear   . Infection    UTI  . Normal pregnancy 10/04/2011  . SVD (spontaneous vaginal delivery) 10/04/2011  . Trichomonas   . Vaginal Pap smear, abnormal     Patient Active Problem List   Diagnosis Date Noted  . Exposure to syphilis 11/04/2017  . Obesity 10/12/2013    Past Surgical History:  Procedure Laterality Date  . LEEP    . NO PAST SURGERIES    . TOOTH EXTRACTION       OB History    Gravida  2   Para  2   Term  2   Preterm  0   AB  0   Living  2     SAB  0   TAB  0   Ectopic  0   Multiple  0   Live Births  2            Home Medications    Prior to Admission medications   Medication Sig Start Date End Date Taking? Authorizing Provider  apixaban (ELIQUIS) 5 MG TABS tablet Take 1 tablet (5 mg total) by mouth 2 (two) times daily. 03/10/18   Pricilla Loveless, MD  apixaban (ELIQUIS) 5 MG TABS tablet Take 1 tablet (5 mg total) by  mouth 2 (two) times daily. 03/10/18   Pricilla Loveless, MD  medroxyPROGESTERone (DEPO-PROVERA) 150 MG/ML injection Inject 150 mg into the muscle every 3 (three) months.    [provider]  OVER THE COUNTER MEDICATION Take 1 tablet by mouth daily. Over the counter daily vitamin "Hair, Skin, & Nails" made by [employer].    [provider]    Family History Family History  Problem Relation Age of Onset  . Diabetes Maternal Grandfather   . Hypertension Mother   . Diabetes Mother   . Hypertension Father   . Diabetes Sister   . Hypertension Sister   . Sarcoidosis Brother   . Anesthesia problems Neg Hx   . Hypotension Neg Hx   . Malignant hyperthermia Neg Hx   . Pseudochol deficiency Neg Hx     Social History Social History   Tobacco Use  . Smoking status: Never Smoker  . Smokeless tobacco: Never Used  Substance Use Topics  . Alcohol use: Yes    Comment: occassionally  . Drug use: No  Allergies   Patient has no known allergies.   Review of Systems Review of Systems  Constitutional: Negative for fever.  Respiratory: Positive for chest tightness. Negative for shortness of breath.   Cardiovascular: Positive for chest pain and palpitations. Negative for leg swelling.  Gastrointestinal: Negative for abdominal pain, diarrhea and vomiting.  Neurological: Positive for light-headedness.  All other systems reviewed and are negative.    Physical Exam Updated Vital Signs BP 123/75   Pulse 95   Temp 98.6 F (37 C) (Oral)   Resp 19   Ht 5\' 5"  (1.651 m)   Wt (!) 145.2 kg   SpO2 100%   BMI 53.25 kg/m   Physical Exam  Constitutional: She appears well-developed and well-nourished.  Non-toxic appearance. She does not appear ill. No distress.  obese  HENT:  Head: Normocephalic and atraumatic.  Right Ear: External ear normal.  Left Ear: External ear normal.  Nose: Nose normal.  Eyes: Right eye exhibits no discharge. Left eye exhibits no discharge.    Cardiovascular: Normal heart sounds. An irregularly irregular rhythm present. Tachycardia present.  Pulmonary/Chest: Effort normal and breath sounds normal.  Abdominal: Soft. There is no tenderness.  Neurological: She is alert.  Skin: Skin is warm and dry.  Psychiatric: Her mood appears not anxious.  Nursing note and vitals reviewed.    ED Treatments / Results  Labs (all labs ordered are listed, but only abnormal results are displayed) Labs Reviewed  BASIC METABOLIC PANEL - Abnormal; Notable for the following components:      Result Value   Glucose, Bld 121 (*)    All other components within normal limits  MAGNESIUM  CBC  TSH  I-STAT BETA HCG BLOOD, ED ([facility], [facility], [facility] ONLY)    EKG EKG Interpretation  Date/Time:  Wednesday March 10 2018 12:19:12 EDT Ventricular Rate:  140 PR Interval:    QRS Duration: 84 QT Interval:  317 QTC Calculation: 484 R Axis:   40 Text Interpretation:  Atrial fibrillation with RVR Borderline prolonged QT interval No old tracing to compare Confirmed by Pricilla Loveless [phone]) on 03/10/2018 12:33:29 PM   Radiology Dg Chest Port 1 View  Result Date: 03/10/2018 CLINICAL DATA:  Chest tightness today EXAM: PORTABLE CHEST 1 VIEW COMPARISON:  03/03/2010 FINDINGS: Borderline cardiomegaly. Vascular congestion. No sign of pulmonary edema. No pneumothorax or pleural effusion. IMPRESSION: Borderline cardiomegaly and vascular congestion. Electronically Signed   By: Jolaine Click M.D.   On: 03/10/2018 13:32    Procedures .Sedation Date/Time: 03/10/2018 4:18 PM Performed by: Pricilla Loveless, MD Authorized by: Pricilla Loveless, MD   Consent:    Consent obtained:  Verbal   Consent given by:  Patient   Risks discussed:  Allergic reaction, dysrhythmia, inadequate sedation, nausea, prolonged hypoxia resulting in organ damage, prolonged sedation necessitating reversal, respiratory compromise necessitating ventilatory assistance and intubation and  vomiting Universal protocol:    Procedure explained and questions answered to patient or proxy's satisfaction: yes     Relevant documents present and verified: yes     Test results available and properly labeled: yes     Imaging studies available: yes     Required blood products, implants, devices, and special equipment available: yes     Site/side marked: yes     Immediately prior to procedure a time out was called: yes     Patient identity confirmation method:  Verbally with patient Indications:    Procedure necessitating sedation performed by:  Physician performing sedation Pre-sedation assessment:  Time since last food or drink:  Earlier in day   ASA classification: class 2 - patient with mild systemic disease     Neck mobility: normal     Mallampati score:  II - soft palate, uvula, fauces visible   Pre-sedation assessments completed and reviewed: airway patency, cardiovascular function, hydration status, mental status, nausea/vomiting, pain level, respiratory function and temperature   Immediate pre-procedure details:    Reassessment: Patient reassessed immediately prior to procedure     Reviewed: vital signs, relevant labs/tests and NPO status     Verified: bag valve mask available, emergency equipment available, intubation equipment available, IV patency confirmed, oxygen available and suction available   Procedure details (see MAR for exact dosages):    Preoxygenation:  Nasal cannula   Sedation:  Propofol   Intra-procedure monitoring:  Blood pressure monitoring, cardiac monitor, continuous pulse oximetry, frequent LOC assessments, frequent vital sign checks and continuous capnometry   Intra-procedure events: none     Total Provider sedation time (minutes):  5 Post-procedure details:    Attendance: Constant attendance by certified staff until patient recovered     Recovery: Patient returned to pre-procedure baseline     Post-sedation assessments completed and reviewed: airway  patency, cardiovascular function, hydration status, mental status, nausea/vomiting, pain level, respiratory function and temperature     Patient is stable for discharge or admission: yes     Patient tolerance:  Tolerated well, no immediate complications .Cardioversion Date/Time: 03/10/2018 4:21 PM Performed by: Pricilla Loveless, MD Authorized by: Pricilla Loveless, MD   Consent:    Consent obtained:  Verbal and written   Consent given by:  Patient   Risks discussed:  Cutaneous burn, death, induced arrhythmia and pain Pre-procedure details:    Cardioversion basis:  Emergent   Rhythm:  Atrial fibrillation   Electrode placement:  Anterior-posterior Patient sedated: Yes. Refer to sedation procedure documentation for details of sedation.  Attempt one:    Cardioversion mode:  Synchronous   Waveform:  Biphasic   Shock (Joules):  200   Shock outcome:  Conversion to normal sinus rhythm Post-procedure details:    Patient status:  Awake   Patient tolerance of procedure:  Tolerated well, no immediate complications .Critical Care Performed by: Pricilla Loveless, MD Authorized by: Pricilla Loveless, MD   Critical care provider statement:    Critical care time (minutes):  30   Critical care time was exclusive of:  Separately billable procedures and treating other patients   Critical care was necessary to treat or prevent imminent or life-threatening deterioration of the following conditions:  Cardiac failure   Critical care was time spent personally by me on the following activities:  Development of treatment plan with patient or surrogate, evaluation of patient's response to treatment, examination of patient, obtaining history from patient or surrogate, ordering and performing treatments and interventions, ordering and review of radiographic studies, ordering and review of laboratory studies, pulse oximetry, re-evaluation of patient's condition and review of old charts   (including critical care  time)  Medications Ordered in ED Medications  sodium chloride 0.9 % bolus 1,000 mL (0 mLs Intravenous Stopped 03/10/18 1426)  sodium chloride 0.9 % bolus 1,000 mL (0 mLs Intravenous Stopped 03/10/18 1546)  propofol (DIPRIVAN) 10 mg/mL bolus/IV push (10 mg Intravenous Given 03/10/18 1451)  apixaban (ELIQUIS) tablet 5 mg (5 mg Oral Given 03/10/18 1546)     Initial Impression / Assessment and Plan / ED Course  I have reviewed the triage vital signs and the nursing  notes.  Pertinent labs & imaging results that were available during my care of the patient were reviewed by me and considered in my medical decision making (see chart for details).     Patient's new onset A. fib is likely due to her chronic alcohol abuse.  She was counseled to reduce this use.  While in the ED in A. fib with RVR she was noted to have intermittent PVCs and at one point had a run of 5 ventricular beats.  I discussed this with Dr. Allyson Sabal, who feels she still stable for discharge home given the successful cardioversion as above.  The patient tolerated the procedure well.  She was cardioverted given the acute onset this morning with no prior history of palpitations or A. fib.  Chest tightness was likely related to the tachycardia rather than ACS.  Given all of her symptoms now resolved I think she stable for discharge home.  She will be treated with Eliquis and follow-up in A. fib clinic.  Return precautions.  CHA2DS2/VAS Stroke Risk Points  Current as of 36 minutes ago     >= 2 Points: High Risk  1 - 1.99 Points: Medium Risk  0 Points: Low Risk    The patient's score has not changed in the past year.:  No Change     Details    This score determines the patient's risk of having a stroke if the  patient has atrial fibrillation.       Points Metrics  0 Has Congestive Heart Failure:  No    Current as of 36 minutes ago  0 Has Vascular Disease:  No    Current as of 36 minutes ago  0 Has Hypertension:  No    Current  as of 36 minutes ago  0 Age:  32    Current as of 36 minutes ago  0 Has Diabetes:  No    Current as of 36 minutes ago  0 Had Stroke:  No  Had TIA:  No  Had thromboembolism:  No    Current as of 36 minutes ago  1 Female:  Yes    Current as of 36 minutes ago            Final Clinical Impressions(s) / ED Diagnoses   Final diagnoses:  New onset atrial fibrillation [facility])    ED Discharge Orders         Ordered    Amb referral to AFIB Clinic     03/10/18 1242    apixaban (ELIQUIS) 5 MG TABS tablet  2 times daily     03/10/18 1513    apixaban (ELIQUIS) 5 MG TABS tablet  2 times daily     03/10/18 1553           Pricilla Loveless, MD 03/10/18 1956    Pricilla Loveless, MD 03/22/18 [phone]

## 2018-03-10 NOTE — ED Notes (Signed)
ED Provider at bedside. 

## 2018-03-10 NOTE — Sedation Documentation (Signed)
Fannie Knee, RT at bedside

## 2018-03-10 NOTE — Sedation Documentation (Signed)
Synchronized shock delivered by Criss Alvine, MD

## 2018-03-10 NOTE — ED Triage Notes (Addendum)
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 Pt initial heart rate in 200's. Pt given 5 of metropolol at 1149 and heart rate down to 130's. Pt reports CP initially 10/10 and now 7/10. Pt is alert and oriented. Pt received 600 of NS en route. Pt denies cardiac/medical hx and NKA. Pt placed on 2L [place] for comfort.

## 2018-03-10 NOTE — Progress Notes (Signed)
RT called for conscious sedation procedure.  Pt's vitals prior to procedure, ETCO2 set-up with airway equipment on standby.

## 2018-03-10 NOTE — Sedation Documentation (Signed)
Vital signs stable. 

## 2018-03-10 NOTE — ED Notes (Signed)
Respiratory notified of plan to cardiovert.

## 2018-03-10 NOTE — ED Notes (Signed)
Respiratory at bedside, pt on pads, co2 monitoring connected. Consents signed.

## 2018-03-10 NOTE — ED Notes (Signed)
Pharmacy messaged to send/verify eliquis.

## 2018-03-10 NOTE — Progress Notes (Signed)
Pt's vital post procedure.  RN at bedside.

## 2018-03-10 NOTE — Discharge Instructions (Signed)
The atrial fibrillation is probably due to your alcohol use/abuse.  It is important to cut back and ideally stop alcohol.  If you need help, you should go to a detoxification center.  Otherwise, if you feel recurrent chest pain or palpitations, see the cardiology group listed, or come back to the ER for evaluation.

## 2018-03-10 NOTE — ED Notes (Signed)
Lab called to run specimens.

## 2018-03-10 NOTE — ED Notes (Addendum)
Patient verbalizes understanding of discharge instructions. Opportunity for questioning and answers were provided. Armband removed by staff, pt discharged from ED. Follow up emphasized for A fib clinic. Pt given eliquis prescription by pharmacy and education done.

## 2018-03-10 NOTE — ED Notes (Signed)
Gold top sent down with blood work in case ordered later. MD notified of pt's increasing HR. Bedside at this time.

## 2018-03-12 ENCOUNTER — Encounter

## 2018-03-16 ENCOUNTER — Ambulatory Visit (HOSPITAL_COMMUNITY)
Admission: RE | Admit: 2018-03-16 | Discharge: 2018-03-16 | Disposition: A | Discharge status: Home / Self Care | Payer: Commercial Managed Care - PPO | Source: Ambulatory Visit | Attending: Nurse Practitioner | Admitting: Nurse Practitioner

## 2018-03-16 ENCOUNTER — Encounter (HOSPITAL_COMMUNITY): Payer: Self-pay | Admitting: Nurse Practitioner

## 2018-03-16 VITALS — BP 136/96 | HR 103 | Ht 65.0 in | Wt 298.0 lb

## 2018-03-16 DIAGNOSIS — R000 Tachycardia, unspecified: Secondary | ICD-10-CM | POA: Diagnosis not present

## 2018-03-16 DIAGNOSIS — I480 Paroxysmal atrial fibrillation: Secondary | ICD-10-CM | POA: Diagnosis not present

## 2018-03-16 DIAGNOSIS — Z7901 Long term (current) use of anticoagulants: Secondary | ICD-10-CM | POA: Insufficient documentation

## 2018-03-16 DIAGNOSIS — Z87440 Personal history of urinary (tract) infections: Secondary | ICD-10-CM | POA: Insufficient documentation

## 2018-03-16 DIAGNOSIS — Z8249 Family history of ischemic heart disease and other diseases of the circulatory system: Secondary | ICD-10-CM | POA: Insufficient documentation

## 2018-03-16 DIAGNOSIS — Z6841 Body Mass Index (BMI) 40.0 and over, adult: Secondary | ICD-10-CM | POA: Insufficient documentation

## 2018-03-16 DIAGNOSIS — I4891 Unspecified atrial fibrillation: Secondary | ICD-10-CM | POA: Diagnosis present

## 2018-03-16 DIAGNOSIS — R0683 Snoring: Secondary | ICD-10-CM | POA: Insufficient documentation

## 2018-03-16 MED ORDER — DILTIAZEM HCL 30 MG PO TABS: ORAL_TABLET | ORAL | 1 refills | Status: DC

## 2018-03-16 NOTE — Patient Instructions (Signed)
Your physician has recommended you make the following change in your medication:  1)Cardizem 30mg  -- take 1 tablet every 4 hours AS NEEDED for AFIB heart rate over 100    Scheduling will call for sleep study after approval from insurance.

## 2018-03-16 NOTE — Progress Notes (Signed)
Primary Care Physician: Patient, No Pcp Per Referring Physician: [facility] ER f/u   Deanna [name] is a 32 y.o. female with a h/o morbid obesity, alcohol abuse, in the afib clinic today for f/u new onset afib treated  with cardioversion in the ER.  Pt ins in SR today. She does not use tobacco. She had been using alcohol daily,more on the week ends, but has cut back on alcohol consumption. She has also cut back on caffeine. She is on eliquis for f/u cardioversion. She is motivated to lose weight and change her diet. She does snore.  Today, she denies symptoms of palpitations, chest pain, shortness of breath, orthopnea, PND, lower extremity edema, dizziness, presyncope, syncope, or neurologic sequela. The patient is tolerating medications without difficulties and is otherwise without complaint today.   Past Medical History:  Diagnosis Date  . Abnormal Pap smear   . Infection    UTI  . Normal pregnancy 10/04/2011  . SVD (spontaneous vaginal delivery) 10/04/2011  . Trichomonas   . Vaginal Pap smear, abnormal    Past Surgical History:  Procedure Laterality Date  . LEEP    . NO PAST SURGERIES    . TOOTH EXTRACTION      Current Outpatient Medications  Medication Sig Dispense Refill  . apixaban (ELIQUIS) 5 MG TABS tablet Take 1 tablet (5 mg total) by mouth 2 (two) times daily. 60 tablet 0  . apixaban (ELIQUIS) 5 MG TABS tablet Take 1 tablet (5 mg total) by mouth 2 (two) times daily. 60 tablet 0  . medroxyPROGESTERone (DEPO-PROVERA) 150 MG/ML injection Inject 150 mg into the muscle every 3 (three) months.    Marland Kitchen OVER THE COUNTER MEDICATION Take 1 tablet by mouth daily. Over the counter daily vitamin "Hair, Skin, & Nails" made by [employer].     No current facility-administered medications for this encounter.     No Known Allergies  Social History   Socioeconomic History  . Marital status: Married    Spouse name: Not on file  . Number of children: Not on file  . Years of education:  Not on file  . Highest education level: Not on file  Occupational History  . Not on file  Social Needs  . Financial resource strain: Not on file  . Food insecurity:    Worry: Not on file    Inability: Not on file  . Transportation needs:    Medical: Not on file    Non-medical: Not on file  Tobacco Use  . Smoking status: Never Smoker  . Smokeless tobacco: Never Used  Substance and Sexual Activity  . Alcohol use: Yes    Comment: occassionally  . Drug use: No  . Sexual activity: Yes    Birth control/protection: None  Lifestyle  . Physical activity:    Days per week: Not on file    Minutes per session: Not on file  . Stress: Not on file  Relationships  . Social connections:    Talks on phone: Not on file    Gets together: Not on file    Attends religious service: Not on file    Active member of club or organization: Not on file    Attends meetings of clubs or organizations: Not on file    Relationship status: Not on file  . Intimate partner violence:    Fear of current or ex partner: Not on file    Emotionally abused: Not on file    Physically abused: Not on  file    Forced sexual activity: Not on file  Other Topics Concern  . Not on file  Social History Narrative   Lives with husband, son, and daughter   Gets to appointments in her car   No advanced directive    Sister and husband may make medical decisions if unable    No exercise    Family History  Problem Relation Age of Onset  . Diabetes Maternal Grandfather   . Hypertension Mother   . Diabetes Mother   . Hypertension Father   . Diabetes Sister   . Hypertension Sister   . Sarcoidosis Brother   . Anesthesia problems Neg Hx   . Hypotension Neg Hx   . Malignant hyperthermia Neg Hx   . Pseudochol deficiency Neg Hx     ROS- All systems are reviewed and negative except as per the HPI above  Physical Exam: There were no vitals filed for this visit. Wt Readings from Last 3 Encounters:  03/10/18 (!) 145.2  kg  01/08/18 (!) 138.3 kg  11/04/17 136.1 kg    Labs: Lab Results  Component Value Date   NA 140 03/10/2018   K 4.2 03/10/2018   CL 110 03/10/2018   CO2 23 03/10/2018   GLUCOSE 121 (H) 03/10/2018   BUN 9 03/10/2018   CREATININE 0.88 03/10/2018   CALCIUM 9.1 03/10/2018   MG 1.9 03/10/2018   No results found for: INR No results found for: CHOL, HDL, LDLCALC, TRIG   GEN- The patient is well appearing, alert and oriented x 3 today.   Head- normocephalic, atraumatic Eyes-  Sclera clear, conjunctiva pink Ears- hearing intact Oropharynx- clear Neck- supple, no JVP Lymph- no cervical lymphadenopathy Lungs- Clear to ausculation bilaterally, normal work of breathing Heart- Regular rate and rhythm, no murmurs, rubs or gallops, PMI not laterally displaced GI- soft, NT, ND, + BS Extremities- no clubbing, cyanosis, or edema MS- no significant deformity or atrophy Skin- no rash or lesion Psych- euthymic mood, full affect Neuro- strength and sensation are intact  EKG-SInus tachy at 103 bpm, PR int 162 bpm, qrs int 92 ms, qtc 500 ms   Assessment and Plan: 1. New onset paroxysmal afib General eduction re afib 30 mg cardizem rx if return of afib and how to use Decrease alcohol to no more than 2 drinks a week Limit caffeine Echo  Sleep study for snoring, new onset afib  2. BMI of 49.59 She is interested in being more active and I have referred her to the [facility] sponsored [employer] free 3 month program Weight loss encouraged   3. CHA2DS2VASc score of  0 She will take eliquis 5 mg bid per DCCV protocol x 4 weeks and then can stop  F/u in 3-4 weeks  Lupita Leash [facility] [address] [place], [place] [address] [phone]

## 2018-03-17 ENCOUNTER — Telehealth: Payer: Self-pay | Admitting: *Deleted

## 2018-03-17 NOTE — Telephone Encounter (Signed)
Left message to return a call to get sleep study appointment details. 

## 2018-03-17 NOTE — Telephone Encounter (Signed)
Patient returned a call and was given sleep study appointment details. 

## 2018-03-17 NOTE — Telephone Encounter (Signed)
-----   Message from Shona Simpson, RN sent at 03/16/2018 11:13 AM EDT ----- Regarding: sleep study Pt for sleep study for new afib, snoring per donna carroll np Thanks [facility] RN Afib Clinic

## 2018-03-18 ENCOUNTER — Telehealth: Payer: Self-pay

## 2018-03-18 NOTE — Telephone Encounter (Signed)
Spoke to Deanna [name] about the PREP that she was referred to by the AFIB clinic.  She is interested in participating at the [facility].  She will start in the beginning of 2020 with a new class and we'll be in touch in Dec '19 to make an intake appointment.

## 2018-03-22 DIAGNOSIS — I471 Supraventricular tachycardia: Secondary | ICD-10-CM | POA: Diagnosis not present

## 2018-03-22 DIAGNOSIS — I10 Essential (primary) hypertension: Secondary | ICD-10-CM | POA: Diagnosis not present

## 2018-03-22 DIAGNOSIS — Z23 Encounter for immunization: Secondary | ICD-10-CM | POA: Diagnosis not present

## 2018-03-25 ENCOUNTER — Ambulatory Visit (HOSPITAL_COMMUNITY): Admission: RE | Admit: 2018-03-25 | Payer: Commercial Managed Care - PPO | Source: Ambulatory Visit

## 2018-03-26 ENCOUNTER — Ambulatory Visit: Payer: Commercial Managed Care - PPO

## 2018-03-26 ENCOUNTER — Telehealth: Payer: Self-pay | Admitting: Obstetrics and Gynecology

## 2018-03-26 NOTE — Telephone Encounter (Signed)
Left message on voicemail regarding missed appointment.  °

## 2018-03-26 NOTE — Telephone Encounter (Signed)
Thank you for the update!

## 2018-04-07 ENCOUNTER — Ambulatory Visit (HOSPITAL_COMMUNITY): Admission: RE | Admit: 2018-04-07 | Payer: Commercial Managed Care - PPO | Source: Ambulatory Visit

## 2018-04-12 DIAGNOSIS — I471 Supraventricular tachycardia: Secondary | ICD-10-CM | POA: Diagnosis not present

## 2018-04-12 DIAGNOSIS — I10 Essential (primary) hypertension: Secondary | ICD-10-CM | POA: Diagnosis not present

## 2018-04-14 ENCOUNTER — Ambulatory Visit (HOSPITAL_COMMUNITY)
Admission: RE | Admit: 2018-04-14 | Discharge: 2018-04-14 | Disposition: A | Discharge status: Home / Self Care | Payer: Commercial Managed Care - PPO | Source: Ambulatory Visit | Attending: Nurse Practitioner | Admitting: Nurse Practitioner

## 2018-04-14 DIAGNOSIS — I480 Paroxysmal atrial fibrillation: Secondary | ICD-10-CM | POA: Insufficient documentation

## 2018-04-14 NOTE — Progress Notes (Signed)
  Echocardiogram 2D Echocardiogram has been performed.  Deanna [name] Deanna [name] 04/14/2018, 12:23 PM

## 2018-04-15 DIAGNOSIS — I10 Essential (primary) hypertension: Secondary | ICD-10-CM | POA: Diagnosis not present

## 2018-04-15 DIAGNOSIS — Z6841 Body Mass Index (BMI) 40.0 and over, adult: Secondary | ICD-10-CM | POA: Diagnosis not present

## 2018-04-15 DIAGNOSIS — I4891 Unspecified atrial fibrillation: Secondary | ICD-10-CM | POA: Diagnosis not present

## 2018-04-16 ENCOUNTER — Encounter (HOSPITAL_COMMUNITY): Payer: Self-pay | Admitting: Nurse Practitioner

## 2018-04-16 ENCOUNTER — Ambulatory Visit (HOSPITAL_COMMUNITY)
Admission: RE | Admit: 2018-04-16 | Discharge: 2018-04-16 | Disposition: A | Discharge status: Home / Self Care | Payer: Commercial Managed Care - PPO | Source: Ambulatory Visit | Attending: Nurse Practitioner | Admitting: Nurse Practitioner

## 2018-04-16 VITALS — BP 146/84 | HR 101 | Ht 65.0 in | Wt 295.0 lb

## 2018-04-16 DIAGNOSIS — Z79899 Other long term (current) drug therapy: Secondary | ICD-10-CM | POA: Diagnosis not present

## 2018-04-16 DIAGNOSIS — F1010 Alcohol abuse, uncomplicated: Secondary | ICD-10-CM | POA: Insufficient documentation

## 2018-04-16 DIAGNOSIS — R000 Tachycardia, unspecified: Secondary | ICD-10-CM | POA: Diagnosis not present

## 2018-04-16 DIAGNOSIS — Z6841 Body Mass Index (BMI) 40.0 and over, adult: Secondary | ICD-10-CM | POA: Diagnosis not present

## 2018-04-16 DIAGNOSIS — Z793 Long term (current) use of hormonal contraceptives: Secondary | ICD-10-CM | POA: Diagnosis not present

## 2018-04-16 DIAGNOSIS — I480 Paroxysmal atrial fibrillation: Secondary | ICD-10-CM | POA: Diagnosis not present

## 2018-04-16 MED ORDER — DILTIAZEM HCL ER COATED BEADS 120 MG PO CP24: 120.0000 mg | ORAL_CAPSULE | Freq: Every day | ORAL | 3 refills | Status: DC

## 2018-04-16 NOTE — Patient Instructions (Signed)
Start Cardizem 120mg once a day 

## 2018-04-16 NOTE — Progress Notes (Signed)
Primary Care Physician: Lewis Moccasin, MD Referring Physician: [facility] ER f/u   Deanna [name] is a 32 y.o. female with a h/o morbid obesity, alcohol abuse, in the afib clinic today for f/u new onset afib treated  with cardioversion in the ER.  Pt ins in SR today. She does not use tobacco. She had been using alcohol daily,more on the week ends, but has cut back on alcohol consumption. She has also cut back on caffeine. She is on eliquis for f/u cardioversion. She is motivated to lose weight and change her diet. She does snore.  F/u in afib clinic 11/22. She denies any further afib.She has lost  a few lbs. Echo reviewed with pt. Her HR today on last visit runs in SR around 100 bpm. Discussed today to start rate control as I would like to see her resting HR lower.  Today, she denies symptoms of palpitations, chest pain, shortness of breath, orthopnea, PND, lower extremity edema, dizziness, presyncope, syncope, or neurologic sequela. The patient is tolerating medications without difficulties and is otherwise without complaint today.   Past Medical History:  Diagnosis Date  . Abnormal Pap smear   . Infection    UTI  . Normal pregnancy 10/04/2011  . SVD (spontaneous vaginal delivery) 10/04/2011  . Trichomonas   . Vaginal Pap smear, abnormal    Past Surgical History:  Procedure Laterality Date  . LEEP    . NO PAST SURGERIES    . TOOTH EXTRACTION      Current Outpatient Medications  Medication Sig Dispense Refill  . medroxyPROGESTERone (DEPO-PROVERA) 150 MG/ML injection Inject 150 mg into the muscle every 3 (three) months.    . valsartan (DIOVAN) 80 MG tablet Take 80 mg by mouth daily.  1  . diltiazem (CARDIZEM CD) 120 MG 24 hr capsule Take 1 capsule (120 mg total) by mouth daily. 30 capsule 3  . diltiazem (CARDIZEM) 30 MG tablet Take 1 tablet every 4 hours AS NEEDED for AFIB heart rate over 100 (Patient not taking: Reported on 04/16/2018) 30 tablet 1   No current  facility-administered medications for this encounter.     No Known Allergies  Social History   Socioeconomic History  . Marital status: Married    Spouse name: Not on file  . Number of children: Not on file  . Years of education: Not on file  . Highest education level: Not on file  Occupational History  . Not on file  Social Needs  . Financial resource strain: Not on file  . Food insecurity:    Worry: Not on file    Inability: Not on file  . Transportation needs:    Medical: Not on file    Non-medical: Not on file  Tobacco Use  . Smoking status: Never Smoker  . Smokeless tobacco: Never Used  Substance and Sexual Activity  . Alcohol use: Yes    Comment: occassionally  . Drug use: No  . Sexual activity: Yes    Birth control/protection: None  Lifestyle  . Physical activity:    Days per week: Not on file    Minutes per session: Not on file  . Stress: Not on file  Relationships  . Social connections:    Talks on phone: Not on file    Gets together: Not on file    Attends religious service: Not on file    Active member of club or organization: Not on file    Attends meetings of clubs or organizations:  Not on file    Relationship status: Not on file  . Intimate partner violence:    Fear of current or ex partner: Not on file    Emotionally abused: Not on file    Physically abused: Not on file    Forced sexual activity: Not on file  Other Topics Concern  . Not on file  Social History Narrative   Lives with husband, son, and daughter   Gets to appointments in her car   No advanced directive    Sister and husband may make medical decisions if unable    No exercise    Family History  Problem Relation Age of Onset  . Diabetes Maternal Grandfather   . Hypertension Mother   . Diabetes Mother   . Hypertension Father   . Diabetes Sister   . Hypertension Sister   . Sarcoidosis Brother   . Anesthesia problems Neg Hx   . Hypotension Neg Hx   . Malignant hyperthermia  Neg Hx   . Pseudochol deficiency Neg Hx     ROS- All systems are reviewed and negative except as per the HPI above  Physical Exam: Vitals:   04/16/18 1118  BP: (!) 146/84  Pulse: (!) 101  Weight: 133.8 kg  Height: 5\' 5"  (1.651 m)   Wt Readings from Last 3 Encounters:  04/16/18 133.8 kg  03/16/18 135.2 kg  03/10/18 (!) 145.2 kg    Labs: Lab Results  Component Value Date   NA 140 03/10/2018   K 4.2 03/10/2018   CL 110 03/10/2018   CO2 23 03/10/2018   GLUCOSE 121 (H) 03/10/2018   BUN 9 03/10/2018   CREATININE 0.88 03/10/2018   CALCIUM 9.1 03/10/2018   MG 1.9 03/10/2018   No results found for: INR No results found for: CHOL, HDL, LDLCALC, TRIG   GEN- The patient is well appearing, alert and oriented x 3 today.   Head- normocephalic, atraumatic Eyes-  Sclera clear, conjunctiva pink Ears- hearing intact Oropharynx- clear Neck- supple, no JVP Lymph- no cervical lymphadenopathy Lungs- Clear to ausculation bilaterally, normal work of breathing Heart- Regular rate and rhythm, no murmurs, rubs or gallops, PMI not laterally displaced GI- soft, NT, ND, + BS Extremities- no clubbing, cyanosis, or edema MS- no significant deformity or atrophy Skin- no rash or lesion Psych- euthymic [name], full affect Neuro- strength and sensation are intact  EKG-SInus tachy at 103 bpm, PR int 162 bpm, qrs int 92 ms, qtc 500 ms Echo-Study Conclusions  - Left ventricle: The cavity size was normal. Wall thickness was   increased in a pattern of mild LVH. There was mild concentric   hypertrophy. Systolic function was normal. The estimated ejection   fraction was in the range of 55% to 60%. Wall motion was normal;   there were no regional wall motion abnormalities. Doppler   parameters are consistent with abnormal left ventricular   relaxation (grade 1 diastolic dysfunction).  Assessment and Plan: 1. New onset paroxysmal afib Nofurther afib General eduction re afib reviewed 30 mg  cardizem rx if return of afib and how to use Decrease alcohol to no more than 2 drinks a week Limit caffeine Echo reviewed with pt Sleep study for snoring, new onset afib  2. BMI of 49.59 She is interested in being more active and I have referred her to the [facility] free 3 month program Weight loss encouraged   3. CHA2DS2VASc score of 2(female, htn) She took eliquis 5 mg bid per DCCV  protocol x 4 weeks and then  Stopped By 2019 guidelines, states female + 2 other risk factors for long term anticoagulation  4.Resting Sinus tachycardia Will start 120 mg cardizem daily   F/u 2 weeks for BP/HR check  Lupita Leash [facility] [address] [place], [place] [address] [phone]

## 2018-04-19 ENCOUNTER — Ambulatory Visit: Payer: Commercial Managed Care - PPO | Admitting: Family Medicine

## 2018-04-25 ENCOUNTER — Ambulatory Visit (HOSPITAL_BASED_OUTPATIENT_CLINIC_OR_DEPARTMENT_OTHER): Payer: Commercial Managed Care - PPO | Attending: Nurse Practitioner

## 2018-04-29 ENCOUNTER — Encounter (HOSPITAL_COMMUNITY): Payer: Self-pay | Admitting: Nurse Practitioner

## 2018-05-03 ENCOUNTER — Telehealth: Payer: Self-pay

## 2018-05-03 NOTE — Telephone Encounter (Signed)
Left VM regarding the 12-week PREP at the [facility] to start on 06/01/18 and for her to call me back about registering.

## 2018-06-02 NOTE — Progress Notes (Signed)
[facility] PREP Progress Report   Patient Details  Name: Deanna [name] MRN: [id number] Date of Birth: 1986-03-26 Age: 33 y.o. PCP: Lewis Moccasin, MD  Vitals:   06/02/18 1142  Pulse: 85  Resp: 18  SpO2: 98%  Weight: 275 lb (124.7 kg)     [facility] Eval - 06/02/18 1100      Referral    Referring Provider  AFIB clinic    Reason for referral  Obesitity/Overweight;Family History;Hypertension    Program Start Date  06/01/18      Measurement   Body fat  46.3 percent      Information for Trainer   Goals  To lose 20lbs, improve eating habits    Current Exercise  walking w/ job, walking 1-2hrs/3x's wk.     Orthopedic Concerns  none    Pertinent Medical History  afib-oct 2019, htn    Current Barriers  possible job schedule conflicts      Mobility and Daily Activities   I find it easy to walk up or down two or more flights of stairs.  1    I have no trouble taking out the trash.  4    I do housework such as vacuuming and dusting on my own without difficulty.  4    I can easily lift a gallon of milk (8lbs).  4    I can easily walk a mile.  4    I have no trouble reaching into high cupboards or reaching down to pick up something from the floor.  4    I do not have trouble doing out-door work such as Loss adjuster, chartered, raking leaves, or gardening.  4      Mobility and Daily Activities   I feel younger than my age.  3    I feel independent.  4    I feel energetic.  4    I live an active life.   1    I feel strong.  4    I feel healthy.  3    I feel active as other people my age.  1      How fit and strong are you.   Fit and Strong Total Score  45      Past Medical History:  Diagnosis Date  . Abnormal Pap smear   . Infection    UTI  . Normal pregnancy 10/04/2011  . SVD (spontaneous vaginal delivery) 10/04/2011  . Trichomonas   . Vaginal Pap smear, abnormal    Past Surgical History:  Procedure Laterality Date  . LEEP    . NO PAST SURGERIES    . TOOTH  EXTRACTION     Social History   Tobacco Use  Smoking Status Never Smoker  Smokeless Tobacco Never Used    Hospital doctor is registered for the 12-week, twice weekly PREP at the [facility].  She forgot about her first class yesterday but plans to make it from now on.    Rose Fillers 06/02/2018, 11:47 AM

## 2019-03-14 ENCOUNTER — Other Ambulatory Visit: Payer: Self-pay

## 2019-03-14 DIAGNOSIS — Z20822 Contact with and (suspected) exposure to covid-19: Secondary | ICD-10-CM

## 2019-03-15 ENCOUNTER — Encounter (HOSPITAL_COMMUNITY): Payer: Self-pay | Admitting: Emergency Medicine

## 2019-03-15 ENCOUNTER — Other Ambulatory Visit: Payer: Self-pay

## 2019-03-15 ENCOUNTER — Ambulatory Visit (HOSPITAL_COMMUNITY)
Admission: EM | Admit: 2019-03-15 | Discharge: 2019-03-15 | Disposition: A | Discharge status: Home / Self Care | Payer: Self-pay

## 2019-03-15 DIAGNOSIS — I482 Chronic atrial fibrillation, unspecified: Secondary | ICD-10-CM | POA: Insufficient documentation

## 2019-03-15 DIAGNOSIS — R000 Tachycardia, unspecified: Secondary | ICD-10-CM

## 2019-03-15 DIAGNOSIS — M545 Low back pain, unspecified: Secondary | ICD-10-CM

## 2019-03-15 DIAGNOSIS — R112 Nausea with vomiting, unspecified: Secondary | ICD-10-CM | POA: Insufficient documentation

## 2019-03-15 DIAGNOSIS — Z3202 Encounter for pregnancy test, result negative: Secondary | ICD-10-CM

## 2019-03-15 DIAGNOSIS — R197 Diarrhea, unspecified: Secondary | ICD-10-CM | POA: Insufficient documentation

## 2019-03-15 DIAGNOSIS — E860 Dehydration: Secondary | ICD-10-CM

## 2019-03-15 DIAGNOSIS — Z8679 Personal history of other diseases of the circulatory system: Secondary | ICD-10-CM

## 2019-03-15 DIAGNOSIS — A084 Viral intestinal infection, unspecified: Secondary | ICD-10-CM

## 2019-03-15 HISTORY — DX: Essential (primary) hypertension: I10

## 2019-03-15 LAB — POCT URINALYSIS DIP (DEVICE)
Glucose, UA: NEGATIVE mg/dL
Hgb urine dipstick: NEGATIVE
Ketones, ur: 15 mg/dL — AB
Leukocytes,Ua: NEGATIVE
Nitrite: NEGATIVE
Protein, ur: >=300 mg/dL — AB
Specific Gravity, Urine: >=1.03 (ref 1.005–1.030)
Urobilinogen, UA: 1 mg/dL (ref 0.0–1.0)
pH: 6 (ref 5.0–8.0)

## 2019-03-15 LAB — POCT PREGNANCY, URINE: Preg Test, Ur: NEGATIVE

## 2019-03-15 MED ORDER — SODIUM CHLORIDE 0.9 % IV BOLUS
1000.0000 mL | Freq: Once | INTRAVENOUS | Status: AC
  Administered 2019-03-15: 1000 mL via INTRAVENOUS

## 2019-03-15 MED ORDER — ONDANSETRON HCL 4 MG/2ML IJ SOLN
INTRAMUSCULAR | Status: AC
  Filled 2019-03-15: qty 2

## 2019-03-15 MED ORDER — ONDANSETRON HCL 4 MG/2ML IJ SOLN
4.0000 mg | Freq: Once | INTRAMUSCULAR | Status: AC
  Administered 2019-03-15: 4 mg via INTRAVENOUS

## 2019-03-15 MED ORDER — LOPERAMIDE HCL 2 MG PO CAPS: 2.0000 mg | ORAL_CAPSULE | Freq: Every day | ORAL | 0 refills | Status: DC | PRN

## 2019-03-15 MED ORDER — ONDANSETRON 8 MG PO TBDP: 8.0000 mg | ORAL_TABLET | Freq: Three times a day (TID) | ORAL | 0 refills | Status: DC | PRN

## 2019-03-15 MED FILL — LOPERAMIDE 2 MG CAPSULE: 2 | 10 days supply | Qty: 10 | Fill #0

## 2019-03-15 MED FILL — OLMESARTAN-HCTZ 40-25 MG TA: 40-25 | 30 days supply | Qty: 30 | Fill #0

## 2019-03-15 MED FILL — ONDANSETRON ODT 8 MG TABLET: 8 | 7 days supply | Qty: 20 | Fill #0

## 2019-03-15 NOTE — ED Triage Notes (Signed)
vomiting and diarrhea since Sunday.. patient reports 12-13 episodes of diarrhea since mn.  Patient reports 3 episodes of vomiting since mn. Patient complains of lower back pain.

## 2019-03-15 NOTE — ED Provider Notes (Addendum)
MRN: [id number] DOB: 12/19/1985  Subjective:   Deanna [name] is a 33 y.o. female presenting for 1 day history of acute onset worsening moderate-severe nausea with vomiting, diarrhea.  Patient has had about 12-13 episodes of diarrhea and about 5 episodes of vomiting.  She has practice social distancing, has a daughter and has taking care of herself in school as well, works as a Runner, broadcasting/film/video.  Denies any known COVID-19 contacts.  Has not tried medications for relief.  Has a history of atrial fibrillation and is monitored closely by her PCP and cardiologist.  No current facility-administered medications for this encounter.   Current Outpatient Medications:  .  NON FORMULARY, Blood pressure medicine-does not know name, Disp: , Rfl:  .  diltiazem (CARDIZEM CD) 120 MG 24 hr capsule, Take 1 capsule (120 mg total) by mouth daily., Disp: 30 capsule, Rfl: 3 .  diltiazem (CARDIZEM) 30 MG tablet, Take 1 tablet every 4 hours AS NEEDED for AFIB heart rate over 100 (Patient not taking: Reported on 04/16/2018), Disp: 30 tablet, Rfl: 1 .  medroxyPROGESTERone (DEPO-PROVERA) 150 MG/ML injection, Inject 150 mg into the muscle every 3 (three) months., Disp: , Rfl:  .  valsartan (DIOVAN) 80 MG tablet, Take 80 mg by mouth daily., Disp: , Rfl: 1   No Known Allergies  Past Medical History:  Diagnosis Date  . Abnormal Pap smear   . Hypertension   . Infection    UTI  . Normal pregnancy 10/04/2011  . SVD (spontaneous vaginal delivery) 10/04/2011  . Trichomonas   . Vaginal Pap smear, abnormal      Past Surgical History:  Procedure Laterality Date  . LEEP    . NO PAST SURGERIES    . TOOTH EXTRACTION      Review of Systems  Constitutional: Negative for fever and malaise/fatigue.  HENT: Negative for congestion, ear pain, sinus pain and sore throat.   Eyes: Negative for discharge and redness.  Respiratory: Negative for cough, hemoptysis, shortness of breath and wheezing.   Cardiovascular: Negative for chest pain.   Gastrointestinal: Positive for diarrhea, nausea and vomiting. Negative for abdominal pain.  Genitourinary: Negative for dysuria, flank pain and hematuria.  Musculoskeletal: Positive for back pain (low back). Negative for myalgias.  Skin: Negative for rash.  Neurological: Negative for dizziness, weakness and headaches.  Psychiatric/Behavioral: Negative for depression and substance abuse.   Social History   Tobacco Use  . Smoking status: Never Smoker  . Smokeless tobacco: Never Used  Substance Use Topics  . Alcohol use: Yes    Comment: occassionally  . Drug use: No   Family History  Problem Relation Age of Onset  . Diabetes Maternal Grandfather   . Hypertension Mother   . Diabetes Mother   . Hypertension Father   . Diabetes Sister   . Hypertension Sister   . Sarcoidosis Brother   . Anesthesia problems Neg Hx   . Hypotension Neg Hx   . Malignant hyperthermia Neg Hx   . Pseudochol deficiency Neg Hx      Objective:   Vitals: BP (!) 139/91 (BP Location: Left Arm)   Pulse (!) 103   Temp 97.6 F (36.4 C) (Tympanic)   Resp 17   LMP 03/08/2019   SpO2 99%   Pulse was 122bpm on recheck by PA-Jadon Harbaugh at 11:34.  Last pulse check was 110bpm on recheck by PA-Jeannifer Drakeford at 12:05.  Physical Exam Constitutional:      General: She is not in acute distress.    Appearance:  Normal appearance. She is well-developed. She is obese. She is not ill-appearing, toxic-appearing or diaphoretic.  HENT:     Head: Normocephalic and atraumatic.     Right Ear: External ear normal.     Left Ear: External ear normal.     Nose: Nose normal.     Mouth/Throat:     Mouth: Mucous membranes are moist.     Pharynx: Oropharynx is clear.  Eyes:     General: No scleral icterus.    Extraocular Movements: Extraocular movements intact.     Pupils: Pupils are equal, round, and reactive to light.  Neck:     Vascular: No carotid bruit.  Cardiovascular:     Rate and Rhythm: Regular rhythm. Tachycardia present.      Heart sounds: Normal heart sounds. No murmur. No friction rub. No gallop.   Pulmonary:     Effort: Pulmonary effort is normal. No respiratory distress.     Breath sounds: Normal breath sounds. No stridor. No wheezing, rhonchi or rales.  Abdominal:     General: Bowel sounds are normal. There is no distension.     Palpations: Abdomen is soft. There is no mass.     Tenderness: There is no abdominal tenderness. There is no right CVA tenderness, left CVA tenderness, guarding or rebound.  Skin:    General: Skin is warm and dry.     Coloration: Skin is not pale.     Findings: No rash.  Neurological:     General: No focal deficit present.     Mental Status: She is alert and oriented to person, place, and time.     Cranial Nerves: No cranial nerve deficit.  Psychiatric:        Mood and Affect: Mood normal.        Behavior: Behavior normal.        Thought Content: Thought content normal.        Judgment: Judgment normal.     Results for orders placed or performed during the hospital encounter of 03/15/19 (from the past 24 hour(s))  POCT urinalysis dip (device)     Status: Abnormal   Collection Time: 03/15/19 10:02 AM  Result Value Ref Range   Glucose, UA NEGATIVE NEGATIVE mg/dL   Bilirubin Urine MODERATE (A) NEGATIVE   Ketones, ur 15 (A) NEGATIVE mg/dL   Specific Gravity, Urine >=1.030 1.005 - 1.030   Hgb urine dipstick NEGATIVE NEGATIVE   pH 6.0 5.0 - 8.0   Protein, ur >=300 (A) NEGATIVE mg/dL   Urobilinogen, UA 1.0 0.0 - 1.0 mg/dL   Nitrite NEGATIVE NEGATIVE   Leukocytes,Ua NEGATIVE NEGATIVE  Pregnancy, urine POC     Status: None   Collection Time: 03/15/19 10:07 AM  Result Value Ref Range   Preg Test, Ur NEGATIVE NEGATIVE    Bolus of IV normal saline given in clinic together with IV Zofran.   ED ECG REPORT   Date: 03/15/2019  Rate: 118bpm  Rhythm: sinus tachycardia  QRS Axis: normal  Intervals: normal  ST/T Wave abnormalities: nonspecific T wave changes   Conduction Disutrbances:none  Narrative Interpretation: Nonspecific T wave flattening in lead II, 3, aVL, aVF, V2; P wave inversions in lead V2.  Sinus tachycardia 118 bpm.  Comparable to EKG from 03/16/2018.  Old EKG Reviewed: changes noted  I have personally reviewed the EKG tracing and agree with the computerized printout as noted.   Assessment and Plan :   1. Viral gastroenteritis   2. Nausea vomiting and diarrhea  3. Acute low back pain without sciatica, unspecified back pain laterality   4. Chronic atrial fibrillation (HCC)   5. Tachycardia   6. Dehydration     Discussed case and ecg reviewed with Dr. Tracie Harrier. Tachycardia likely due to moderate-severe dehydration. She is not in atrial fibrillation or atrial flutter. Patient is agreeable to monitoring pulse at home, pushing fluids. Otherwise, will manage for gastroenteritis with supportive care.  Recommended patient hydrate well, eat light meals and maintain electrolytes.  Will use Zofran and Imodium for nausea, vomiting and diarrhea. If still symptomatic tomorrow, will have her rtc tomorrow for a recheck. Counseled patient on potential for adverse effects with medications prescribed/recommended today, strict ER and return-to-clinic precautions discussed, patient verbalized understanding.   Wallis Bamberg, PA-C 03/15/19 1206

## 2019-03-15 NOTE — ED Notes (Signed)
Sent to bathroom 

## 2019-03-16 LAB — NOVEL CORONAVIRUS, NAA: SARS-CoV-2, NAA: NOT DETECTED

## 2019-04-20 ENCOUNTER — Other Ambulatory Visit: Payer: Self-pay

## 2019-04-20 DIAGNOSIS — Z20822 Contact with and (suspected) exposure to covid-19: Secondary | ICD-10-CM

## 2019-04-22 LAB — NOVEL CORONAVIRUS, NAA: SARS-CoV-2, NAA: NOT DETECTED

## 2019-05-05 MED FILL — OLMESARTAN-HCTZ 40-25 MG TA: 40-25 | 30 days supply | Qty: 30 | Fill #0

## 2019-05-31 MED FILL — OLMESARTAN-HCTZ 40-25 MG TA: 40-25 | 30 days supply | Qty: 30 | Fill #1

## 2019-06-25 ENCOUNTER — Ambulatory Visit (HOSPITAL_COMMUNITY)
Admission: EM | Admit: 2019-06-25 | Discharge: 2019-06-25 | Disposition: A | Discharge status: Home / Self Care | Payer: Self-pay

## 2019-06-25 ENCOUNTER — Encounter (HOSPITAL_COMMUNITY): Payer: Self-pay | Admitting: Emergency Medicine

## 2019-06-25 ENCOUNTER — Encounter (HOSPITAL_COMMUNITY): Payer: Self-pay | Admitting: *Deleted

## 2019-06-25 ENCOUNTER — Other Ambulatory Visit: Payer: Self-pay

## 2019-06-25 ENCOUNTER — Emergency Department (HOSPITAL_COMMUNITY)
Admission: EM | Admit: 2019-06-25 | Discharge: 2019-06-25 | Disposition: A | Discharge status: Home / Self Care | Payer: Medicaid Other | Attending: Emergency Medicine | Admitting: Emergency Medicine

## 2019-06-25 ENCOUNTER — Emergency Department (HOSPITAL_COMMUNITY): Payer: Medicaid Other

## 2019-06-25 ENCOUNTER — Ambulatory Visit (INDEPENDENT_AMBULATORY_CARE_PROVIDER_SITE_OTHER): Payer: Self-pay

## 2019-06-25 DIAGNOSIS — M62838 Other muscle spasm: Secondary | ICD-10-CM

## 2019-06-25 DIAGNOSIS — M542 Cervicalgia: Secondary | ICD-10-CM

## 2019-06-25 DIAGNOSIS — R61 Generalized hyperhidrosis: Secondary | ICD-10-CM

## 2019-06-25 DIAGNOSIS — R002 Palpitations: Secondary | ICD-10-CM | POA: Insufficient documentation

## 2019-06-25 DIAGNOSIS — Z8679 Personal history of other diseases of the circulatory system: Secondary | ICD-10-CM

## 2019-06-25 DIAGNOSIS — R000 Tachycardia, unspecified: Secondary | ICD-10-CM

## 2019-06-25 DIAGNOSIS — Z79899 Other long term (current) drug therapy: Secondary | ICD-10-CM | POA: Insufficient documentation

## 2019-06-25 DIAGNOSIS — I4891 Unspecified atrial fibrillation: Secondary | ICD-10-CM | POA: Insufficient documentation

## 2019-06-25 HISTORY — DX: Unspecified atrial fibrillation: I48.91

## 2019-06-25 LAB — BASIC METABOLIC PANEL
Anion gap: 13 (ref 5–15)
BUN: 13 mg/dL (ref 6–20)
CO2: 21 mmol/L — ABNORMAL LOW (ref 22–32)
Calcium: 9.2 mg/dL (ref 8.9–10.3)
Chloride: 104 mmol/L (ref 98–111)
Creatinine, Ser: 0.87 mg/dL (ref 0.44–1.00)
GFR calc Af Amer: >60 mL/min (ref >60)
GFR calc non Af Amer: >60 mL/min (ref >60)
Glucose, Bld: 124 mg/dL — ABNORMAL HIGH (ref 70–99)
Potassium: 3.9 mmol/L (ref 3.5–5.1)
Sodium: 138 mmol/L (ref 135–145)

## 2019-06-25 LAB — CBC
HCT: 44.6 % (ref 36.0–46.0)
Hemoglobin: 14.8 g/dL (ref 12.0–15.0)
MCH: 28.9 pg (ref 26.0–34.0)
MCHC: 33.2 g/dL (ref 30.0–36.0)
MCV: 87.1 fL (ref 80.0–100.0)
Platelets: 368 10*3/uL (ref 150–400)
RBC: 5.12 MIL/uL — ABNORMAL HIGH (ref 3.87–5.11)
RDW: 15.9 % — ABNORMAL HIGH (ref 11.5–15.5)
WBC: 7.2 10*3/uL (ref 4.0–10.5)
nRBC: 0 % (ref 0.0–0.2)

## 2019-06-25 LAB — TROPONIN I (HIGH SENSITIVITY)
Troponin I (High Sensitivity): <2 ng/L (ref <18)
Troponin I (High Sensitivity): <2 ng/L (ref <18)

## 2019-06-25 LAB — I-STAT BETA HCG BLOOD, ED (MC, WL, AP ONLY): I-stat hCG, quantitative: <5 m[IU]/mL (ref <5)

## 2019-06-25 MED ORDER — MORPHINE SULFATE (PF) 2 MG/ML IV SOLN
2.0000 mg | Freq: Once | INTRAVENOUS | Status: AC
  Administered 2019-06-25: 2 mg via INTRAVENOUS
  Filled 2019-06-25: qty 1

## 2019-06-25 MED ORDER — LIDOCAINE 5 % EX PTCH
1.0000 | MEDICATED_PATCH | CUTANEOUS | Status: DC
  Administered 2019-06-25: 1 via TRANSDERMAL
  Filled 2019-06-25: qty 1

## 2019-06-25 MED ORDER — SODIUM CHLORIDE 0.9 % IV BOLUS
1000.0000 mL | Freq: Once | INTRAVENOUS | Status: AC
  Administered 2019-06-25: 1000 mL via INTRAVENOUS

## 2019-06-25 MED ORDER — ACETAMINOPHEN 325 MG PO TABS
650.0000 mg | ORAL_TABLET | Freq: Once | ORAL | Status: AC
  Administered 2019-06-25: 650 mg via ORAL

## 2019-06-25 MED ORDER — CYCLOBENZAPRINE HCL 10 MG PO TABS: 10.0000 mg | ORAL_TABLET | Freq: Two times a day (BID) | ORAL | 0 refills | Status: DC | PRN

## 2019-06-25 MED ORDER — METOPROLOL TARTRATE 5 MG/5ML IV SOLN
5.0000 mg | Freq: Once | INTRAVENOUS | Status: AC
  Administered 2019-06-25: 16:00:00 5 mg via INTRAVENOUS
  Filled 2019-06-25: qty 5

## 2019-06-25 MED ORDER — METHOCARBAMOL 500 MG PO TABS
750.0000 mg | ORAL_TABLET | Freq: Once | ORAL | Status: AC
  Administered 2019-06-25: 17:00:00 750 mg via ORAL
  Filled 2019-06-25: qty 2

## 2019-06-25 MED ORDER — ACETAMINOPHEN 325 MG PO TABS
ORAL_TABLET | ORAL | Status: AC
  Filled 2019-06-25: qty 2

## 2019-06-25 MED ORDER — SODIUM CHLORIDE 0.9% FLUSH
3.0000 mL | Freq: Once | INTRAVENOUS | Status: AC
  Administered 2019-06-25: 14:00:00 3 mL via INTRAVENOUS

## 2019-06-25 NOTE — Discharge Instructions (Addendum)
You were given a prescription for Robaxin which is a muscle relaxer.  You should not drive, work, or operate machinery while taking this medication as it can make you very drowsy.    You are given information follow-up with your regular doctor so that you can establish care with a primary doctor.  You are also given information to follow-up with a cardiologist in regards to your palpitations.  Please call the office schedule an appointment for follow-up.  Please return to the emergency department for any new or worsening symptoms in the meantime.

## 2019-06-25 NOTE — ED Notes (Signed)
Bed: UC04 Expected date:  Expected time:  Means of arrival:  Comments: 1130 Appt

## 2019-06-25 NOTE — Discharge Instructions (Addendum)
Please report to the Emergency Room now as you have persistent tachycardia, have had diaphoresis in the clinic. I am concerned that you will need more imaging for your neck as your x-ray is completely normal but will also need more labs given your tachycardia and diaphoresis.

## 2019-06-25 NOTE — ED Triage Notes (Addendum)
Denies injury.  C/O thoracic pain alongside thoracic spine bilaterally, radiating up into neck with gradual onset x 5 days.  Denies any parasthesias.  Areas are tender to palpation and movement. Denies fevers.  Has taken IBU and tried icy hot patches without relief.

## 2019-06-25 NOTE — ED Notes (Signed)
EKG shown to M. [place], [place].

## 2019-06-25 NOTE — ED Provider Notes (Signed)
Pt seen in conjunction with Harlene Salts, PA-C at shift change. See his note for full H&P.  Per his note, "Deanna [name] is a 34 y.o. female with history of atrial fibrillation, hypertension.  Patient presents today from urgent care for palpitations and tachycardia.  Patient was seen at urgent care today for 6-day history of neck pain, she describes bilateral trapezius aching sensation constant worsened with movement palpation and without alleviating factors or radiation of pain.  She denies any injury or trauma, headache, vision changes, sore throat, fever/chills or additional concerns.  She reports history of similar in the past when she "cricked" her neck. - While patient was having x-rays of her neck performed at the urgent care she reports that she developed palpitations, a feeling of her heart racing which has been constant since that time.  Per urgent care note patient appeared diaphoretic in radiology and was tacky at 117-126 bpm.  She was transferred to ER for further evaluation.  Reports that she gets frequent episodes of palpitations, she had been followed at cardiology last year but had since lost her insurance.  Patient reports that when she was diagnosed with atrial fibrillation in 2019 she was on a blood thinner for 1 month however she was taken off after that episode resolved."  Physical Exam  BP (!) 144/93   Pulse 88   Temp 97.8 F (36.6 C) (Oral)   Resp 14   SpO2 96%   Physical Exam Vitals and nursing note reviewed.  Constitutional:      General: She is not in acute distress.    Appearance: She is well-developed.  HENT:     Head: Normocephalic and atraumatic.  Eyes:     Conjunctiva/sclera: Conjunctivae normal.  Cardiovascular:     Rate and Rhythm: Normal rate.  Pulmonary:     Effort: Pulmonary effort is normal.     Breath sounds: No decreased breath sounds, wheezing, rhonchi or rales.  Musculoskeletal:        General: Normal range of motion.     Cervical  back: Neck supple.  Skin:    General: Skin is warm and dry.  Neurological:     Mental Status: She is alert.      ED Course/Procedures     Procedures  Results for orders placed or performed during the hospital encounter of 06/25/19  Basic metabolic panel  Result Value Ref Range   Sodium 138 135 - 145 mmol/L   Potassium 3.9 3.5 - 5.1 mmol/L   Chloride 104 98 - 111 mmol/L   CO2 21 (L) 22 - 32 mmol/L   Glucose, Bld 124 (H) 70 - 99 mg/dL   BUN 13 6 - 20 mg/dL   Creatinine, Ser 2.99 0.44 - 1.00 mg/dL   Calcium 9.2 8.9 - 24.2 mg/dL   GFR calc non Af Amer >60 >60 mL/min   GFR calc Af Amer >60 >60 mL/min   Anion gap 13 5 - 15  CBC  Result Value Ref Range   WBC 7.2 4.0 - 10.5 K/uL   RBC 5.12 (H) 3.87 - 5.11 MIL/uL   Hemoglobin 14.8 12.0 - 15.0 g/dL   HCT 68.3 41.9 - 62.2 %   MCV 87.1 80.0 - 100.0 fL   MCH 28.9 26.0 - 34.0 pg   MCHC 33.2 30.0 - 36.0 g/dL   RDW 29.7 (H) 98.9 - 21.1 %   Platelets 368 150 - 400 K/uL   nRBC 0.0 0.0 - 0.2 %  I-Stat beta hCG  blood, ED  Result Value Ref Range   I-stat hCG, quantitative <5.0 <5 mIU/mL   Comment 3          Troponin I (High Sensitivity)  Result Value Ref Range   Troponin I (High Sensitivity) <2 <18 ng/L  Troponin I (High Sensitivity)  Result Value Ref Range   Troponin I (High Sensitivity) <2 <18 ng/L   DG Chest 2 View  Result Date: 06/25/2019 CLINICAL DATA:  Tachycardia. Palpitations. Chest pain. Shortness of breath. EXAM: CHEST - 2 VIEW COMPARISON:  03/10/2018 FINDINGS: The heart size and mediastinal contours are within normal limits. Both lungs are clear. The visualized skeletal structures are unremarkable. IMPRESSION: Negative.  No active cardiopulmonary disease. Electronically Signed   By: Marlaine Hind M.D.   On: 06/25/2019 14:38   DG Cervical Spine Complete  Result Date: 06/25/2019 CLINICAL DATA:  Pain without injury. EXAM: CERVICAL SPINE - COMPLETE 4+ VIEW COMPARISON:  None. FINDINGS: The pre odontoid space and prevertebral  soft tissues are normal. Straightening of normal lordosis. Alignment is otherwise normal. No fractures are seen. The neural foramina are patent. No other abnormalities. The lung apices are normal. IMPRESSION: No acute abnormalities are seen to explain the patient's symptoms. Electronically Signed   By: Dorise Bullion III M.D   On: 06/25/2019 12:44    EKG Interpretation  Date/Time:  Saturday June 25 2019 14:09:29 EST Ventricular Rate:  114 PR Interval:    QRS Duration: 87 QT Interval:  328 QTC Calculation: 452 R Axis:   76 Text Interpretation: Sinus tachycardia Confirmed by Pattricia Boss [phone]) on 06/25/2019 2:28:26 PM      6:00 PM Cardiac monitoring reveals NSR, HR 88 (Rate & rhythm), as reviewed and interpreted by me. Cardiac monitoring was ordered due to tachycardia, palpitations, and to monitor patient for dysrhythmia.   MDM   Briefly, 34 y/o F presented to UC with bilat trapezius pain. While getting cervical spine xray she developed palpitations and diaphoresis therefore was sent here for further eval. She does have long h/o palpitations and was previously followed by cards but lost insurance so has not followed up. She does not have any chest pain or shortness of breath. At shift change, pending delta troponin and recheck after administration of lopressor. If w/u reassuring, discharge is anticipated with cardiology f/u.   On arrival to the ED she is somewhat tachycardic and tachypneic. The remainder of her VS are stable.  Reviewed labs,  Cbc, bmp reassuring. Beta hcg neg Delta trops neg  ekg with sinus tach but nonsichemic  cxr neg  Pt heart rate improved after administration of metoprolol.  Reviewed x-ray from urgent care which did not show any evidence of fracture or abnormality in the cervical spine.  She does have tenderness to the bilateral trapezius muscles.  She has been given Rx for Flexeril already which I advised her to take.  With regard to her palpitations, we  will give her follow-up with cardiology.  Also will give her information to follow-up with PCP.  She voices understanding the plan and reasons to return.  All questions answered.  Patient stable for discharge.         Bishop Dublin 06/26/19 0013    Noemi Chapel, MD 06/26/19 Lurena Nida

## 2019-06-25 NOTE — ED Triage Notes (Signed)
Pt. Stated, I was told to come here for fast heart rate while getting my xrays. I was there for neck pain and said I needed more

## 2019-06-25 NOTE — ED Provider Notes (Signed)
[facility]   MRN: [id number] DOB: 1985-12-26  Subjective:   Deanna [name] is a 34 y.o. female presenting for 6-day history acute onset persistent and worsening neck pain, stiffness.  Patient states that she has tried ibuprofen but not today.  She did take a muscle relaxer this morning given to her by a friend.  Denies falls, trauma, radicular symptoms, weakness, numbness or tingling.  Patient has a history of atrial fibrillation and hypertension but is only taking Benicar.  Currently, patient denies severe headache, chest pain, shortness of breath, heart racing, palpitations, abdominal pain, hematuria.  No current facility-administered medications for this encounter.  Current Outpatient Medications:  .  medroxyPROGESTERone Acetate (DEPO-PROVERA IM), Inject into the muscle., Disp: , Rfl:  .  valsartan (DIOVAN) 80 MG tablet, Take 80 mg by mouth daily., Disp: , Rfl: 1 .  diltiazem (CARDIZEM CD) 120 MG 24 hr capsule, Take 1 capsule (120 mg total) by mouth daily., Disp: 30 capsule, Rfl: 3 .  diltiazem (CARDIZEM) 30 MG tablet, Take 1 tablet every 4 hours AS NEEDED for AFIB heart rate over 100 (Patient not taking: Reported on 04/16/2018), Disp: 30 tablet, Rfl: 1 .  loperamide (IMODIUM) 2 MG capsule, Take 1 capsule (2 mg total) by mouth daily as needed for diarrhea or loose stools., Disp: 10 capsule, Rfl: 0 .  medroxyPROGESTERone (DEPO-PROVERA) 150 MG/ML injection, Inject 150 mg into the muscle every 3 (three) months., Disp: , Rfl:  .  NON FORMULARY, Blood pressure medicine-does not know name, Disp: , Rfl:  .  ondansetron (ZOFRAN-ODT) 8 MG disintegrating tablet, Take 1 tablet (8 mg total) by mouth every 8 (eight) hours as needed for nausea or vomiting., Disp: 20 tablet, Rfl: 0   No Known Allergies  Past Medical History:  Diagnosis Date  . Abnormal Pap smear   . Atrial fibrillation (HCC)   . Hypertension   . Infection    UTI  . Normal pregnancy 10/04/2011  . SVD (spontaneous vaginal  delivery) 10/04/2011  . Trichomonas   . Vaginal Pap smear, abnormal      Past Surgical History:  Procedure Laterality Date  . LEEP    . TOOTH EXTRACTION      Family History  Problem Relation Age of Onset  . Diabetes Maternal Grandfather   . Hypertension Mother   . Diabetes Mother   . Hypertension Father   . Diabetes Sister   . Hypertension Sister   . Sarcoidosis Brother   . Anesthesia problems Neg Hx   . Hypotension Neg Hx   . Malignant hyperthermia Neg Hx   . Pseudochol deficiency Neg Hx     Social History   Tobacco Use  . Smoking status: Never Smoker  . Smokeless tobacco: Never Used  Substance Use Topics  . Alcohol use: Yes    Comment: occasionally  . Drug use: No    ROS   Objective:   Vitals: BP 115/83   Pulse (!) 123 Comment: pt reports hx of atrial fibrillation; states HR in 120s is not unusual for her  Temp 98.1 F (36.7 C) (Oral)   Resp 20   SpO2 98%   Wt Readings from Last 3 Encounters:  06/02/18 275 lb (124.7 kg)  04/16/18 295 lb (133.8 kg)  03/16/18 298 lb (135.2 kg)   Temp Readings from Last 3 Encounters:  06/25/19 98.1 F (36.7 C) (Oral)  03/15/19 97.6 F (36.4 C) (Tympanic)  03/10/18 98.6 F (37 C) (Oral)   BP Readings from Last 3  Encounters:  06/25/19 115/83  03/15/19 (!) 139/91  04/16/18 (!) 146/84   Pulse Readings from Last 3 Encounters:  06/25/19 (!) 123  03/15/19 (!) 103  06/02/18 85   Physical Exam Constitutional:      General: She is not in acute distress.    Appearance: Normal appearance. She is well-developed. She is ill-appearing. She is not toxic-appearing or diaphoretic.  HENT:     Head: Normocephalic and atraumatic.     Nose: Nose normal.     Mouth/Throat:     Mouth: Mucous membranes are moist.  Eyes:     Extraocular Movements: Extraocular movements intact.     Pupils: Pupils are equal, round, and reactive to light.  Cardiovascular:     Rate and Rhythm: Regular rhythm. Tachycardia present.     Pulses:  Normal pulses.     Heart sounds: Normal heart sounds. No murmur. No friction rub. No gallop.   Pulmonary:     Effort: Pulmonary effort is normal. No respiratory distress.     Breath sounds: Normal breath sounds. No stridor. No wheezing, rhonchi or rales.  Musculoskeletal:     Cervical back: Rigidity, spasms and tenderness (Along entirety of her neck extending into her trapezius muscles bilaterally with associated spasms) present. No swelling, edema, deformity, erythema, signs of trauma, lacerations or bony tenderness. Pain with movement present. Decreased range of motion.     Thoracic back: Spasms present. No swelling, edema, deformity, signs of trauma, lacerations, tenderness or bony tenderness. Normal range of motion. No scoliosis.  Skin:    General: Skin is warm and dry.     Findings: No rash.  Neurological:     Mental Status: She is alert and oriented to person, place, and time.  Psychiatric:        Mood and Affect: Mood normal.        Behavior: Behavior normal.        Thought Content: Thought content normal.        Judgment: Judgment normal.     DG Cervical Spine Complete  Result Date: 06/25/2019 CLINICAL DATA:  Pain without injury. EXAM: CERVICAL SPINE - COMPLETE 4+ VIEW COMPARISON:  None. FINDINGS: The pre odontoid space and prevertebral soft tissues are normal. Straightening of normal lordosis. Alignment is otherwise normal. No fractures are seen. The neural foramina are patent. No other abnormalities. The lung apices are normal. IMPRESSION: No acute abnormalities are seen to explain the patient's symptoms. Electronically Signed   By: Dorise Bullion III M.D   On: 06/25/2019 12:44   ED ECG REPORT   Date: 06/25/2019  Rate: 117 bpm  Rhythm: sinus tachycardia  QRS Axis: normal  Intervals: normal  ST/T Wave abnormalities: normal  Conduction Disutrbances:none  Narrative Interpretation: Sinus tachycardia at 117 bpm, unchanged including heart rate from previous EKG on 03/15/2019.      Old EKG Reviewed: unchanged  I have personally reviewed the EKG tracing and agree with the computerized printout as noted.   Assessment and Plan :   1. Tachycardia   2. Neck pain   3. Muscle spasms of neck   4. Trapezius muscle spasm   5. History of atrial fibrillation   6. Morbid obesity ([place])   7. Diaphoresis     Patient had diaphoretic episode while in radiology room. She has persistent tachycardia ranging from 117-126bpm. Pain was minimally improved with Tylenol. No neurologic findings, heart sounds normal apart from tachycardia. However, given her persistent findings I believe she is in need of higher  level of care and further work up in the emergency room. Patient is agreeable to this. Will head there now. Counseled patient on potential for adverse effects with medications prescribed/recommended today, ER and return-to-clinic precautions discussed, patient verbalized understanding.     Wallis Bamberg, [place] 06/25/19 1317

## 2019-06-25 NOTE — ED Provider Notes (Addendum)
[facility] EMERGENCY DEPARTMENT Provider Note   CSN: [id number] Arrival date & time: 06/25/19  1323     History Chief Complaint  Patient presents with  . Neck Pain  . Palpitations  . Chest Pain    Deanna [name] is a 34 y.o. female with history of atrial fibrillation, hypertension.  Patient presents today from urgent care for palpitations and tachycardia.  Patient was seen at urgent care today for 6-day history of neck pain, she describes bilateral trapezius aching sensation constant worsened with movement palpation and without alleviating factors or radiation of pain.  She denies any injury or trauma, headache, vision changes, sore throat, fever/chills or additional concerns.  She reports history of similar in the past when she "cricked" her neck. - While patient was having x-rays of her neck performed at the urgent care she reports that she developed palpitations, a feeling of her heart racing which has been constant since that time.  Per urgent care note patient appeared diaphoretic in radiology and was tacky at 117-126 bpm.  She was transferred to ER for further evaluation.  Reports that she gets frequent episodes of palpitations, she had been followed at cardiology last year but had since lost her insurance.  Patient reports that when she was diagnosed with atrial fibrillation in 2019 she was on a blood thinner for 1 month however she was taken off after that episode resolved. HPI     Past Medical History:  Diagnosis Date  . Abnormal Pap smear   . Atrial fibrillation (HCC)   . Hypertension   . Infection    UTI  . Normal pregnancy 10/04/2011  . SVD (spontaneous vaginal delivery) 10/04/2011  . Trichomonas   . Vaginal Pap smear, abnormal     Patient Active Problem List   Diagnosis Date Noted  . Exposure to syphilis 11/04/2017  . Obesity 10/12/2013    Past Surgical History:  Procedure Laterality Date  . LEEP    . TOOTH EXTRACTION       OB  History    Gravida  2   Para  2   Term  2   Preterm  0   AB  0   Living  2     SAB  0   TAB  0   Ectopic  0   Multiple  0   Live Births  2           Family History  Problem Relation Age of Onset  . Diabetes Maternal Grandfather   . Hypertension Mother   . Diabetes Mother   . Hypertension Father   . Diabetes Sister   . Hypertension Sister   . Sarcoidosis Brother   . Anesthesia problems Neg Hx   . Hypotension Neg Hx   . Malignant hyperthermia Neg Hx   . Pseudochol deficiency Neg Hx     Social History   Tobacco Use  . Smoking status: Never Smoker  . Smokeless tobacco: Never Used  Substance Use Topics  . Alcohol use: Yes    Comment: occasionally  . Drug use: No    Home Medications Prior to Admission medications   Medication Sig Start Date End Date Taking? Authorizing Provider  cyclobenzaprine (FLEXERIL) 10 MG tablet Take 1 tablet (10 mg total) by mouth 2 (two) times daily as needed for muscle spasms. 06/25/19   Wallis Bamberg, PA-C  diltiazem (CARDIZEM CD) 120 MG 24 hr capsule Take 1 capsule (120 mg total) by mouth daily. 04/16/18  Newman Nip, NP  diltiazem (CARDIZEM) 30 MG tablet Take 1 tablet every 4 hours AS NEEDED for AFIB heart rate over 100 Patient not taking: Reported on 04/16/2018 03/16/18   Newman Nip, NP  loperamide (IMODIUM) 2 MG capsule Take 1 capsule (2 mg total) by mouth daily as needed for diarrhea or loose stools. 03/15/19   Wallis Bamberg, PA-C  medroxyPROGESTERone (DEPO-PROVERA) 150 MG/ML injection Inject 150 mg into the muscle every 3 (three) months.    [provider]  medroxyPROGESTERone Acetate (DEPO-PROVERA IM) Inject into the muscle.    [provider]  NON FORMULARY Blood pressure medicine-does not know name    [provider]  olmesartan (BENICAR) 20 MG tablet Take 20 mg by mouth daily.    [provider]  ondansetron (ZOFRAN-ODT) 8 MG disintegrating tablet Take 1 tablet (8 mg total) by  mouth every 8 (eight) hours as needed for nausea or vomiting. 03/15/19   Wallis Bamberg, PA-C  valsartan (DIOVAN) 80 MG tablet Take 80 mg by mouth daily. 03/22/18   [provider]    Allergies    Patient has no known allergies.  Review of Systems   Review of Systems Ten systems are reviewed and are negative for acute change except as noted in the HPI  Physical Exam Updated Vital Signs BP 132/84   Pulse (!) 103   Temp 97.8 F (36.6 C) (Oral)   Resp (!) 24   SpO2 95%   Physical Exam Constitutional:      General: She is not in acute distress.    Appearance: Normal appearance. She is well-developed. She is not ill-appearing or diaphoretic.  HENT:     Head: Normocephalic and atraumatic.     Right Ear: External ear normal.     Left Ear: External ear normal.     Nose: Nose normal.  Eyes:     General: Vision grossly intact. Gaze aligned appropriately.     Pupils: Pupils are equal, round, and reactive to light.  Neck:     Trachea: Trachea and phonation normal. No tracheal tenderness or tracheal deviation.      Comments: Bilateral trapezius muscular tenderness to palpation without overlying skin changes. Cardiovascular:     Rate and Rhythm: Normal rate and regular rhythm.     Pulses:          Radial pulses are 2+ on the right side and 2+ on the left side.  Pulmonary:     Effort: Pulmonary effort is normal. No accessory muscle usage or respiratory distress.     Breath sounds: Normal breath sounds and air entry.  Chest:     Chest wall: No deformity, tenderness or crepitus.  Abdominal:     General: There is no distension.     Palpations: Abdomen is soft.     Tenderness: There is no abdominal tenderness. There is no guarding or rebound.  Musculoskeletal:        General: Normal range of motion.     Cervical back: Neck supple. No edema or rigidity. Muscular tenderness present. No spinous process tenderness.  Skin:    General: Skin is warm and dry.  Neurological:      Mental Status: She is alert.     GCS: GCS eye subscore is 4. GCS verbal subscore is 5. GCS motor subscore is 6.     Comments: Speech is clear and goal oriented, follows commands Major Cranial nerves without deficit, no facial droop Normal strength in upper extremities with  strong and equal grip strength Sensation normal to light and sharp touch Moves extremities without ataxia, coordination intact Normal gait  Psychiatric:        Behavior: Behavior normal.     ED Results / Procedures / Treatments   Labs (all labs ordered are listed, but only abnormal results are displayed) Labs Reviewed  BASIC METABOLIC PANEL - Abnormal; Notable for the following components:      Result Value   CO2 21 (*)    Glucose, Bld 124 (*)    All other components within normal limits  CBC - Abnormal; Notable for the following components:   RBC 5.12 (*)    RDW 15.9 (*)    All other components within normal limits  I-STAT BETA HCG BLOOD, ED ([facility], [facility], [facility] ONLY)  TROPONIN I (HIGH SENSITIVITY)  TROPONIN I (HIGH SENSITIVITY)    EKG EKG Interpretation  Date/Time:  Saturday June 25 2019 14:09:29 EST Ventricular Rate:  114 PR Interval:    QRS Duration: 87 QT Interval:  328 QTC Calculation: 452 R Axis:   76 Text Interpretation: Sinus tachycardia Confirmed by Margarita Grizzle [phone]) on 06/25/2019 2:28:26 PM   Radiology DG Chest 2 View  Result Date: 06/25/2019 CLINICAL DATA:  Tachycardia. Palpitations. Chest pain. Shortness of breath. EXAM: CHEST - 2 VIEW COMPARISON:  03/10/2018 FINDINGS: The heart size and mediastinal contours are within normal limits. Both lungs are clear. The visualized skeletal structures are unremarkable. IMPRESSION: Negative.  No active cardiopulmonary disease. Electronically Signed   By: Danae Orleans M.D.   On: 06/25/2019 14:38   DG Cervical Spine Complete  Result Date: 06/25/2019 CLINICAL DATA:  Pain without injury. EXAM: CERVICAL SPINE - COMPLETE 4+ VIEW COMPARISON:  None.  FINDINGS: The pre odontoid space and prevertebral soft tissues are normal. Straightening of normal lordosis. Alignment is otherwise normal. No fractures are seen. The neural foramina are patent. No other abnormalities. The lung apices are normal. IMPRESSION: No acute abnormalities are seen to explain the patient's symptoms. Electronically Signed   By: Gerome Sam III M.D   On: 06/25/2019 12:44   Procedures Procedures (including critical care time)  Medications Ordered in ED Medications  lidocaine (LIDODERM) 5 % 1 patch (1 patch Transdermal Patch Applied 06/25/19 1431)  metoprolol tartrate (LOPRESSOR) injection 5 mg (has no administration in time range)  sodium chloride flush (NS) 0.9 % injection 3 mL (3 mLs Intravenous Given 06/25/19 1415)  sodium chloride 0.9 % bolus 1,000 mL (1,000 mLs Intravenous New Bag/Given 06/25/19 1415)  morphine 2 MG/ML injection 2 mg (2 mg Intravenous Given 06/25/19 1431)    ED Course  I have reviewed the triage vital signs and the nursing notes.  Pertinent labs & imaging results that were available during my care of the patient were reviewed by me and considered in my medical decision making (see chart for details).    MDM Rules/Calculators/A&P                     34 year old female arrives from urgent care today for tachycardia and palpitations that began while she was receiving x-rays of her cervical spine for evaluation of ongoing pain there for the past week.  On exam she is resting comfortably no acute distress.  She is mildly tachycardic on exam, no hypoxia, chest pain or shortness of breath.  She reports she has episodes of palpitations quite frequently and has lost cardiology follow-up due to insurance problems.  She is not currently on any medications for  her palpitations and cannot recall what medications she used to be on.  She has been placed on a cardiac monitor and chest pain work-up was initiated by triage staff.  Will give fluid bolus and  reassess.  As to patient's neck pain for the past 6 days it appears very muscular in nature she is tender to the bilateral trapezius without overlying skin change.  She has no history of injury or trauma no infectious-like symptoms patient has any headache, vision changes does not have meningeal signs on exam.  Doubt meningitis or other infectious etiologies at this time, additionally history and physical examination not consistent with dissection or other emergent pathologies at this time.  X-rays obtained at the urgent care are negative for acute findings, reviewed x-rays and agree with radiologist interpretation.  Will give patient Lidoderm patches and pain medication today.  She has no neurologic complaints and has equal strength of her bilateral upper extremities.  Do not feel further imaging of the cervical spine is indicated at this time. - Initial work-up is reassuring, initial troponin negative.  Beta-hCG negative.  BMP shows mildly elevated glucose and CO2 of 21.  CBC without anemia or leukocytosis.  Chest x-ray does not show any active cardiopulmonary disease reviewed and agree with radiologist interpretation.  EKG reviewed with Dr. Jeanell Sparrow and is without acute ischemic findings.  On reassessment patient resting comfortably using her phone and in no acute distress.  She reports her palpitations have begun to resolve and describes them as mild at this point.  Heart rate of 108 bpm on the monitor.  Discussed case with Dr. Jeanell Sparrow, will give 5 mg IV Lopressor at this time.  Delta troponin is currently pending.  Doubt pulmonary embolism, dissection or other acute cardiopulmonary etiology of her symptoms today as these palpitations have been intermittent for over a year at this time.  SPO2 100% on room air on reevaluation.  Anticipate discharge.  Care handoff given to Elida PA-C at shift change.  Plan of care is to follow-up on pending lab, reassess.  Disposition per oncoming team.   Note: Portions  of this report may have been transcribed using voice recognition software. Every effort was made to ensure accuracy; however, inadvertent computerized transcription errors may still be present. Final Clinical Impression(s) / ED Diagnoses Final diagnoses:  None    Rx / DC Orders ED Discharge Orders    None       Deliah Boston, PA-C 06/25/19 1544    Gari Crown 06/25/19 1546    Pattricia Boss, MD 06/26/19 [phone]

## 2019-07-15 ENCOUNTER — Inpatient Hospital Stay: Payer: Medicaid Other | Admitting: Internal Medicine

## 2019-07-26 ENCOUNTER — Encounter: Payer: Self-pay | Admitting: Internal Medicine

## 2019-07-26 ENCOUNTER — Other Ambulatory Visit: Payer: Self-pay

## 2019-07-26 ENCOUNTER — Ambulatory Visit: Payer: Self-pay | Attending: Internal Medicine | Admitting: Internal Medicine

## 2019-07-26 DIAGNOSIS — I10 Essential (primary) hypertension: Secondary | ICD-10-CM | POA: Insufficient documentation

## 2019-07-26 DIAGNOSIS — Z7689 Persons encountering health services in other specified circumstances: Secondary | ICD-10-CM

## 2019-07-26 DIAGNOSIS — I480 Paroxysmal atrial fibrillation: Secondary | ICD-10-CM | POA: Insufficient documentation

## 2019-07-26 MED ORDER — DILTIAZEM HCL ER COATED BEADS 120 MG PO CP24: 120.0000 mg | ORAL_CAPSULE | Freq: Every day | ORAL | 1 refills | Status: DC

## 2019-07-26 NOTE — Progress Notes (Signed)
Virtual Visit via Telephone Note Due to current restrictions/limitations of in-office visits due to the COVID-19 pandemic, this scheduled clinical appointment was converted to a telehealth visit.  Patient requested telephone visit rather than in person.  I connected with Deanna [name] on 07/26/19 at  8:50 AM EST by telephone and verified that I am speaking with the correct person using two identifiers. I am in my office.  The patient is at home.  Only the patient and myself participated in this encounter.  I discussed the limitations, risks, security and privacy concerns of performing an evaluation and management service by telephone and the availability of in person appointments. I also discussed with the patient that there may be a patient responsible charge related to this service. The patient expressed understanding and agreed to proceed.   History of Present Illness: Patient with history of HTN, morbid obesity, PAF,   Purpose of today's visit is to establish care and for ER follow-up.  No current PCP due to lack of insurance.  Patient was seen at [facility] urgent care facility on 06/25/2019 for neck pain.  She was sent to radiology to have x-ray done of the neck.  While there she complained of palpitation and diaphoresis.  She was sent over to the emergency room.  At the emergency room she was found to be in sinus tachycardia with heart rate around 114 on EKG.  Patient gave history of having had atrial fibrillation back in 2019 where she was cardioverted in the emergency room.  She was placed on blood thinner for 1 month post cardioversion.  She was to continue Cardizem.  CHA2DS2VaSc score was 2.  TSH nl.  Echo revealed normal ejection fraction with mild LVH. -pt reports she gets palpitations every day since being dx with a.fib.  -was on Cardizem but pt said it was d/c by previous PCP and placed on Olmesartan instead -denies drinking caffeinated beverages.  Drinks ETOH occasionally.   Hx of  HTN On Benicar but out x 2 wks Has a device but not sure how to check BP Limits salt intake No CP/SOB/LE edema/chronic HA or dizziness.    Soc:  does not smoke and denies street drug use   Outpatient Encounter Medications as of 07/26/2019  Medication Sig Note  . losartan (COZAAR) 50 MG tablet Take 50 mg by mouth daily.   . cyclobenzaprine (FLEXERIL) 10 MG tablet Take 1 tablet (10 mg total) by mouth 2 (two) times daily as needed for muscle spasms. (Patient not taking: Reported on 07/26/2019)   . diltiazem (CARDIZEM CD) 120 MG 24 hr capsule Take 1 capsule (120 mg total) by mouth daily. (Patient not taking: Reported on 07/26/2019) 06/25/2019: Pt states has been without med x 1 yr due to no insurance once she ran out  . diltiazem (CARDIZEM) 30 MG tablet Take 1 tablet every 4 hours AS NEEDED for AFIB heart rate over 100 (Patient not taking: Reported on 04/16/2018)   . loperamide (IMODIUM) 2 MG capsule Take 1 capsule (2 mg total) by mouth daily as needed for diarrhea or loose stools. (Patient not taking: Reported on 07/26/2019)   . medroxyPROGESTERone (DEPO-PROVERA) 150 MG/ML injection Inject 150 mg into the muscle every 3 (three) months.   . medroxyPROGESTERone Acetate (DEPO-PROVERA IM) Inject into the muscle.   . NON FORMULARY Blood pressure medicine-does not know name   . olmesartan (BENICAR) 20 MG tablet Take 20 mg by mouth daily.   . ondansetron (ZOFRAN-ODT) 8 MG disintegrating tablet Take 1  tablet (8 mg total) by mouth every 8 (eight) hours as needed for nausea or vomiting. (Patient not taking: Reported on 07/26/2019)   . valsartan (DIOVAN) 80 MG tablet Take 80 mg by mouth daily.    No facility-administered encounter medications on file as of 07/26/2019.      Observations/Objective: Results for orders placed or performed during the hospital encounter of 58/52/77  Basic metabolic panel  Result Value Ref Range   Sodium 138 135 - 145 mmol/L   Potassium 3.9 3.5 - 5.1 mmol/L   Chloride 104 98 - 111  mmol/L   CO2 21 (L) 22 - 32 mmol/L   Glucose, Bld 124 (H) 70 - 99 mg/dL   BUN 13 6 - 20 mg/dL   Creatinine, Ser 0.87 0.44 - 1.00 mg/dL   Calcium 9.2 8.9 - 10.3 mg/dL   GFR calc non Af Amer >60 >60 mL/min   GFR calc Af Amer >60 >60 mL/min   Anion gap 13 5 - 15  CBC  Result Value Ref Range   WBC 7.2 4.0 - 10.5 K/uL   RBC 5.12 (H) 3.87 - 5.11 MIL/uL   Hemoglobin 14.8 12.0 - 15.0 g/dL   HCT 44.6 36.0 - 46.0 %   MCV 87.1 80.0 - 100.0 fL   MCH 28.9 26.0 - 34.0 pg   MCHC 33.2 30.0 - 36.0 g/dL   RDW 15.9 (H) 11.5 - 15.5 %   Platelets 368 150 - 400 K/uL   nRBC 0.0 0.0 - 0.2 %  I-Stat beta hCG blood, ED  Result Value Ref Range   I-stat hCG, quantitative <5.0 <5 mIU/mL   Comment 3          Troponin I (High Sensitivity)  Result Value Ref Range   Troponin I (High Sensitivity) <2 <18 ng/L  Troponin I (High Sensitivity)  Result Value Ref Range   Troponin I (High Sensitivity) <2 <18 ng/L     Assessment and Plan: 1. Encounter to establish care 2. Essential hypertension I recommend changing from ARB to diltiazem.  Low-salt diet encouraged. Advised to bring her home blood pressure monitoring device with her on follow-up visit so that we can show her how to use it. - diltiazem (CARDIZEM CD) 120 MG 24 hr capsule; Take 1 capsule (120 mg total) by mouth daily.  Dispense: 30 capsule; Refill: 1  3. PAF (paroxysmal atrial fibrillation) ([place]) See #2 above. Encourage patient to pick up forms for the orange card/cone discount and applied for them so that we can refer her for a Holter monitor and cardiology. - diltiazem (CARDIZEM CD) 120 MG 24 hr capsule; Take 1 capsule (120 mg total) by mouth daily.  Dispense: 30 capsule; Refill: 1   Follow Up Instructions: 6 wks in person   I discussed the assessment and treatment plan with the patient. The patient was provided an opportunity to ask questions and all were answered. The patient agreed with the plan and demonstrated an understanding of the  instructions.   The patient was advised to call back or seek an in-person evaluation if the symptoms worsen or if the condition fails to improve as anticipated.  I provided 13 minutes of non-face-to-face time during this encounter.   Karle Plumber, MD

## 2019-07-27 MED FILL — DILTIAZEM 24HR ER 120 MG CA: 120 | 30 days supply | Qty: 30 | Fill #0

## 2019-08-03 ENCOUNTER — Other Ambulatory Visit: Payer: Medicaid Other

## 2019-09-02 MED FILL — DILTIAZEM 24HR ER 120 MG CA: 120 | 30 days supply | Qty: 30 | Fill #1

## 2019-09-09 ENCOUNTER — Ambulatory Visit: Payer: Medicaid Other | Admitting: Internal Medicine

## 2019-09-16 ENCOUNTER — Ambulatory Visit: Payer: Medicaid Other | Admitting: Internal Medicine

## 2019-09-26 NOTE — Progress Notes (Signed)
Patient ID: Deanna [name], female    DOB: [birth year]  MRN: [id number]  CC: High blood pressure follow-up  Subjective: Deanna [name] is a 34 y.o. female with history of essential hypertension, paroxysmal atrial fibrillation, obesity, and exposure to syphilis who presents for high blood pressure follow-up.   1. HYPERTENSION FOLLOW-UP: Currently taking: see medication list Med Adherence: [x]  Yes    []  No Medication side effects: [x]  No Adherence with salt restriction: [x]  Yes    []  No Exercise: Yes []  No [x]  Home Monitoring?: []  Yes    [x]  No, reports has a monitor but does not check blood pressure often Monitoring Frequency: []  Yes    [x]  No Home BP results range: []  Yes    [x]  No Smoking []  Yes [x]  No SOB? []  Yes    [x]  No Chest Pain?: []  Yes    [x]  No Leg swelling?: []  Yes    [x]  No Headaches?: []  Yes    [x]  No Dizziness? [x]  Yes, sometimes Comments: Reports she is having some heart palpitations frequently throughout the day.    Last visit March 2021 with Dr. Laural Benes. During that encounter patient changed from ARB to Diltiazem and a low-salt diet encouraged. Diltiazem 120 mg 24-hour capsule, take 1 capsule daily by mouth prescribed.   Patient Active Problem List   Diagnosis Date Noted  . Essential hypertension 07/26/2019  . PAF (paroxysmal atrial fibrillation) (HCC) 07/26/2019  . Exposure to syphilis 11/04/2017  . Obesity 10/12/2013     Current Outpatient Medications on File Prior to Visit  Medication Sig Dispense Refill  . cyclobenzaprine (FLEXERIL) 10 MG tablet Take 1 tablet (10 mg total) by mouth 2 (two) times daily as needed for muscle spasms. (Patient not taking: Reported on 07/26/2019) 30 tablet 0  . diltiazem (CARDIZEM CD) 120 MG 24 hr capsule Take 1 capsule (120 mg total) by mouth daily. 30 capsule 1  . diltiazem (CARDIZEM) 30 MG tablet Take 1 tablet every 4 hours AS NEEDED for AFIB heart rate over 100 (Patient not taking: Reported on 04/16/2018) 30 tablet 1  .  medroxyPROGESTERone (DEPO-PROVERA) 150 MG/ML injection Inject 150 mg into the muscle every 3 (three) months.    . medroxyPROGESTERone Acetate (DEPO-PROVERA IM) Inject into the muscle.    . NON FORMULARY Blood pressure medicine-does not know name     No current facility-administered medications on file prior to visit.    No Known Allergies  Social History   Socioeconomic History  . Marital status: Married    Spouse name: Not on file  . Number of children: Not on file  . Years of education: Not on file  . Highest education level: Not on file  Occupational History  . Not on file  Tobacco Use  . Smoking status: Never Smoker  . Smokeless tobacco: Never Used  Substance and Sexual Activity  . Alcohol use: Yes    Comment: occasionally  . Drug use: No  . Sexual activity: Yes    Birth control/protection: Injection  Other Topics Concern  . Not on file  Social History Narrative   Lives with husband, son, and daughter   Gets to appointments in her car   No advanced directive    Sister and husband may make medical decisions if unable    No exercise   Social Determinants of Health   Financial Resource Strain:   . Difficulty of Paying Living Expenses:   Food Insecurity:   . Worried About Programme researcher, broadcasting/film/video  in the Last Year:   . Ran Out of Food in the Last Year:   Transportation Needs:   . Freight forwarder (Medical):   Marland Kitchen Lack of Transportation (Non-Medical):   Physical Activity:   . Days of Exercise per Week:   . Minutes of Exercise per Session:   Stress:   . Feeling of Stress :   Social Connections:   . Frequency of Communication with Friends and Family:   . Frequency of Social Gatherings with Friends and Family:   . Attends Religious Services:   . Active Member of Clubs or Organizations:   . Attends Banker Meetings:   Marland Kitchen Marital Status:   Intimate Partner Violence:   . Fear of Current or Ex-Partner:   . Emotionally Abused:   Marland Kitchen Physically Abused:   .  Sexually Abused:     Family History  Problem Relation Age of Onset  . Diabetes Maternal Grandfather   . Hypertension Mother   . Diabetes Mother   . Hypertension Father   . Diabetes Sister   . Hypertension Sister   . Sarcoidosis Brother   . Anesthesia problems Neg Hx   . Hypotension Neg Hx   . Malignant hyperthermia Neg Hx   . Pseudochol deficiency Neg Hx     Past Surgical History:  Procedure Laterality Date  . LEEP    . TOOTH EXTRACTION      ROS: Review of Systems Negative except as stated above  PHYSICAL EXAM: Vitals with BMI 09/27/2019 09/27/2019 06/25/2019  Height - - -  Weight - 284 lbs 10 oz -  BMI - - -  Systolic 144 157 213  Diastolic 96 107 93  Pulse - 96 88  SpO2- 98%, room air Temperature- 97.7 F, oral  Physical Exam General appearance - alert, well appearing, and in no distress, oriented to person, place, and time and overweight Mental status - alert, oriented to person, place, and time, normal [name], behavior, speech, dress, motor activity, and thought processes Neck - supple, no significant adenopathy Lymphatics - no palpable lymphadenopathy, no hepatosplenomegaly Chest - clear to auscultation, no wheezes, rales or rhonchi, symmetric air entry, no tachypnea, retractions or cyanosis Heart - normal rate, regular rhythm, normal S1, S2, no murmurs, rubs, clicks or gallops Neurological - alert, oriented, normal speech, no focal findings or movement disorder noted, neck supple without rigidity, cranial nerves II through XII intact, funduscopic exam normal, discs flat and sharp, DTR's normal and symmetric, motor and sensory grossly normal bilaterally, normal muscle tone, no tremors, strength 5/5, Romberg sign negative, normal gait and station Extremities - peripheral pulses normal, no pedal edema, no clubbing or cyanosis   ASSESSMENT AND PLAN: 1. Essential hypertension: -Patient's blood pressure not at goal during today's visit.  -Continue Diltiazem as prescribed.  Medication refilled.  -Add low-dose Hydrochlorothiazide 12.5 mg tablet (Take 1 tablet) by mouth daily to get blood pressure closer to goal of < 130/80. -Counseled patient that should any side effects occur related to addition of new medication to discontinue use immediately and notify primary physician. Patient states understanding. -Check blood pressure twice daily at home and record these results. Bring these results to your blood pressure check appointment in two weeks with clinical pharmacist.  -Counseled on blood pressure goal of less than 130/80, low-sodium, DASH diet, medication compliance, 150 minutes of moderate intensity exercise per week. Discussed medication compliance, adverse effects. -BMP and CBC last obtained January 2021. -Will obtain repeat BMP in 1 week to evaluate  electrolytes. -Follow-up with primary physician in 6 weeks or sooner if needed. - hydrochlorothiazide (HYDRODIURIL) 12.5 MG tablet; Take 1 tablet (12.5 mg total) by mouth daily.  Dispense: 30 tablet; Refill: 2 - diltiazem (CARDIZEM CD) 120 MG 24 hr capsule; Take 1 capsule (120 mg total) by mouth daily.  Dispense: 30 capsule; Refill: 2 - Basic Metabolic Panel; Future - Amb Referral to Clinical Pharmacist  2. PAF (paroxysmal atrial fibrillation) [facility]): -Patient endorses she has heart palpitations frequently throughout the day. Patient denies any symptoms with heart palpitations. Pulse during today's visit 96. -Continue Diltiazem for management of paroxysmal atrial fibrillation.  -During last visit with Dr. Laural Benes in March 2021 it was recommended that patient apply for [facility] discount/orange card for financial assistance so that she may be referred to Cardiology for a Holter monitor. Today patient states she has not turned in financial assistance application or made an appointment with the financial counselor yet but plans to do so soon. -Follow-up with primary physician in 6 weeks or sooner if needed. - diltiazem  (CARDIZEM CD) 120 MG 24 hr capsule; Take 1 capsule (120 mg total) by mouth daily.  Dispense: 30 capsule; Refill: 2 - Amb Referral to Clinical Pharmacist  Patient was given the opportunity to ask questions.  Patient verbalized understanding of the plan and was able to repeat key elements of the plan. Patient was given clear instructions to go to Emergency Department or return to medical center if symptoms don't improve, worsen, or new problems develop.The patient verbalized understanding.   Requested Prescriptions    No prescriptions requested or ordered in this encounter   Deanna [name] Jodi Geralds, NP

## 2019-09-27 ENCOUNTER — Other Ambulatory Visit: Payer: Self-pay

## 2019-09-27 ENCOUNTER — Ambulatory Visit: Payer: Self-pay | Attending: Internal Medicine | Admitting: Family

## 2019-09-27 ENCOUNTER — Encounter: Payer: Self-pay | Admitting: Family

## 2019-09-27 VITALS — BP 144/96 | HR 96 | Temp 97.7°F | Resp 16 | Wt 284.6 lb

## 2019-09-27 DIAGNOSIS — Z79899 Other long term (current) drug therapy: Secondary | ICD-10-CM | POA: Insufficient documentation

## 2019-09-27 DIAGNOSIS — Z131 Encounter for screening for diabetes mellitus: Secondary | ICD-10-CM

## 2019-09-27 DIAGNOSIS — I10 Essential (primary) hypertension: Secondary | ICD-10-CM

## 2019-09-27 DIAGNOSIS — I480 Paroxysmal atrial fibrillation: Secondary | ICD-10-CM

## 2019-09-27 DIAGNOSIS — Z8249 Family history of ischemic heart disease and other diseases of the circulatory system: Secondary | ICD-10-CM | POA: Insufficient documentation

## 2019-09-27 DIAGNOSIS — E669 Obesity, unspecified: Secondary | ICD-10-CM | POA: Insufficient documentation

## 2019-09-27 MED ORDER — HYDROCHLOROTHIAZIDE 12.5 MG PO TABS: 12.5000 mg | ORAL_TABLET | Freq: Every day | ORAL | 2 refills | Status: DC

## 2019-09-27 MED ORDER — DILTIAZEM HCL ER COATED BEADS 120 MG PO CP24: 120.0000 mg | ORAL_CAPSULE | Freq: Every day | ORAL | 2 refills | Status: DC

## 2019-09-27 MED FILL — CARTIA XT 120 MG CP24: 120 | 30 days supply | Qty: 30 | Fill #0

## 2019-09-27 MED FILL — HYDROCHLOROTHIAZIDE 12.5 MG: 12.5 | 30 days supply | Qty: 30 | Fill #0

## 2019-09-27 NOTE — Patient Instructions (Signed)
Continue Cardizem. Add Hydrochlorothiazide daily. Follow-up in 2 weeks with clinical pharmacist for blood pressure check. Follow-up with primary physician in 6 weeks or sooner if needed. Hypertension, Adult Hypertension is another name for high blood pressure. High blood pressure forces your heart to work harder to pump blood. This can cause problems over time. There are two numbers in a blood pressure reading. There is a top number (systolic) over a bottom number (diastolic). It is best to have a blood pressure that is below 120/80. Healthy choices can help lower your blood pressure, or you may need medicine to help lower it. What are the causes? The cause of this condition is not known. Some conditions may be related to high blood pressure. What increases the risk?  Smoking.  Having type 2 diabetes mellitus, high cholesterol, or both.  Not getting enough exercise or physical activity.  Being overweight.  Having too much fat, sugar, calories, or salt (sodium) in your diet.  Drinking too much alcohol.  Having long-term (chronic) kidney disease.  Having a family history of high blood pressure.  Age. Risk increases with age.  Race. You may be at higher risk if you are African American.  Gender. Men are at higher risk than women before age 41. After age 85, women are at higher risk than men.  Having obstructive sleep apnea.  Stress. What are the signs or symptoms?  High blood pressure may not cause symptoms. Very high blood pressure (hypertensive crisis) may cause: ? Headache. ? Feelings of worry or nervousness (anxiety). ? Shortness of breath. ? Nosebleed. ? A feeling of being sick to your stomach (nausea). ? Throwing up (vomiting). ? Changes in how you see. ? Very bad chest pain. ? Seizures. How is this treated?  This condition is treated by making healthy lifestyle changes, such as: ? Eating healthy foods. ? Exercising more. ? Drinking less alcohol.  Your health  care provider may prescribe medicine if lifestyle changes are not enough to get your blood pressure under control, and if: ? Your top number is above 130. ? Your bottom number is above 80.  Your personal target blood pressure may vary. Follow these instructions at home: Eating and drinking   If told, follow the DASH eating plan. To follow this plan: ? Fill one half of your plate at each meal with fruits and vegetables. ? Fill one fourth of your plate at each meal with whole grains. Whole grains include whole-wheat pasta, brown rice, and whole-grain bread. ? Eat or drink low-fat dairy products, such as skim milk or low-fat yogurt. ? Fill one fourth of your plate at each meal with low-fat (lean) proteins. Low-fat proteins include fish, chicken without skin, eggs, beans, and tofu. ? Avoid fatty meat, cured and processed meat, or chicken with skin. ? Avoid pre-made or processed food.  Eat less than 1,500 mg of salt each day.  Do not drink alcohol if: ? Your doctor tells you not to drink. ? You are pregnant, may be pregnant, or are planning to become pregnant.  If you drink alcohol: ? Limit how much you use to:  0-1 drink a day for women.  0-2 drinks a day for men. ? Be aware of how much alcohol is in your drink. In the [place] one drink equals one 12 oz bottle of beer (355 mL), one 5 oz glass of wine (148 mL), or one 1 oz glass of hard liquor (44 mL). Lifestyle   Work with your doctor to stay  at a healthy weight or to lose weight. Ask your doctor what the best weight is for you.  Get at least 30 minutes of exercise most days of the week. This may include walking, swimming, or biking.  Get at least 30 minutes of exercise that strengthens your muscles (resistance exercise) at least 3 days a week. This may include lifting weights or doing Pilates.  Do not use any products that contain nicotine or tobacco, such as cigarettes, e-cigarettes, and chewing tobacco. If you need help quitting,  ask your doctor.  Check your blood pressure at home as told by your doctor.  Keep all follow-up visits as told by your doctor. This is important. Medicines  Take over-the-counter and prescription medicines only as told by your doctor. Follow directions carefully.  Do not skip doses of blood pressure medicine. The medicine does not work as well if you skip doses. Skipping doses also puts you at risk for problems.  Ask your doctor about side effects or reactions to medicines that you should watch for. Contact a doctor if you:  Think you are having a reaction to the medicine you are taking.  Have headaches that keep coming back (recurring).  Feel dizzy.  Have swelling in your ankles.  Have trouble with your vision. Get help right away if you:  Get a very bad headache.  Start to feel mixed up (confused).  Feel weak or numb.  Feel faint.  Have very bad pain in your: ? Chest. ? Belly (abdomen).  Throw up more than once.  Have trouble breathing. Summary  Hypertension is another name for high blood pressure.  High blood pressure forces your heart to work harder to pump blood.  For most people, a normal blood pressure is less than 120/80.  Making healthy choices can help lower blood pressure. If your blood pressure does not get lower with healthy choices, you may need to take medicine. This information is not intended to replace advice given to you by your health care provider. Make sure you discuss any questions you have with your health care provider. Document Revised: 01/20/2018 Document Reviewed: 01/20/2018 Elsevier Patient Education  2020 [employer].

## 2019-10-11 ENCOUNTER — Other Ambulatory Visit: Payer: Self-pay

## 2019-10-11 ENCOUNTER — Encounter: Payer: Self-pay | Admitting: Pharmacist

## 2019-10-11 ENCOUNTER — Ambulatory Visit: Payer: Self-pay | Attending: Internal Medicine | Admitting: Pharmacist

## 2019-10-11 DIAGNOSIS — I10 Essential (primary) hypertension: Secondary | ICD-10-CM | POA: Insufficient documentation

## 2019-10-11 DIAGNOSIS — Z79899 Other long term (current) drug therapy: Secondary | ICD-10-CM | POA: Insufficient documentation

## 2019-10-11 NOTE — Progress Notes (Signed)
   S:    PCP: Dr. Laural Benes  Patient arrives in good spirits.  Presents to the clinic for hypertension evaluation, counseling, and management. Patient was referred on 09/27/2019 by Ricky Stabs. BP was 144/96 at that visit; Amy started HCTZ 12.5 mg daily.    Medication adherence: reports.  Current BP Medications include: diltiazem 120 mg CD daily, HCTZ 12.5 mg daily  Dietary habits include: limits salt; denies drinking caffeine  Exercise habits include: limited  Family / Social history:  - FHx: DM, HTN - Tobacco: never smoker  - Alcohol: occasional use   ASCVD risk factors include: none  O:  Vitals:   10/11/19 1509  BP: 132/84  Pulse: (!) 104    Home BP readings: none   Last 3 Office BP readings: BP Readings from Last 3 Encounters:  10/11/19 132/84  09/27/19 (!) 144/96  06/25/19 (!) 144/93    BMET    Component Value Date/Time   NA 138 06/25/2019 1346   K 3.9 06/25/2019 1346   CL 104 06/25/2019 1346   CO2 21 (L) 06/25/2019 1346   GLUCOSE 124 (H) 06/25/2019 1346   BUN 13 06/25/2019 1346   CREATININE 0.87 06/25/2019 1346   CALCIUM 9.2 06/25/2019 1346   GFRNONAA >60 06/25/2019 1346   GFRAA >60 06/25/2019 1346    Renal function: CrCl cannot be calculated (Patient's most recent lab result is older than the maximum 21 days allowed.).  Clinical ASCVD: No  The ASCVD Risk score Denman George DC Jr., et al., 2013) failed to calculate for the following reasons:   The 2013 ASCVD risk score is only valid for ages 98 to 78  A/P: Hypertension longstanding currently close to goal on current medications. BP Goal = < 130/80 mmHg. Medication adherence: reports.  -Continued current regimen.  -BMP  -Counseled on lifestyle modifications for blood pressure control including reduced dietary sodium, increased exercise, adequate sleep.  Results reviewed and written information provided.   Total time in face-to-face counseling 15 minutes.   F/U Clinic Visit in June with PCP.   Butch Penny, PharmD, CPP Clinical Pharmacist [facility] [phone]

## 2019-10-12 LAB — BASIC METABOLIC PANEL
BUN/Creatinine Ratio: 16 (ref 9–23)
BUN: 12 mg/dL (ref 6–20)
CO2: 21 mmol/L (ref 20–29)
Calcium: 9.4 mg/dL (ref 8.7–10.2)
Chloride: 104 mmol/L (ref 96–106)
Creatinine, Ser: 0.77 mg/dL (ref 0.57–1.00)
GFR calc Af Amer: 118 mL/min/{1.73_m2} (ref >59)
GFR calc non Af Amer: 102 mL/min/{1.73_m2} (ref >59)
Glucose: 115 mg/dL — ABNORMAL HIGH (ref 65–99)
Potassium: 3.8 mmol/L (ref 3.5–5.2)
Sodium: 139 mmol/L (ref 134–144)

## 2019-10-13 NOTE — Progress Notes (Signed)
Blood sugar higher than normal. A heart healthy diet consistent with baked or broiled lean meat such as chicken or fish and vegetables in addition to at least 30 minutes of moderate intensity exercise as tolerated may help with this. Follow-up with primary physician at next appointment.  Kidney function normal.   Electrolytes normal.

## 2019-10-14 NOTE — Progress Notes (Signed)
A1C ordered to screen for prediabetes. Schedule appointment within 1 week and come to appointment fasting.

## 2019-10-14 NOTE — Addendum Note (Signed)
Addended by: Rema Fendt on: 10/14/2019 09:04 AM   Modules accepted: Orders

## 2019-10-14 NOTE — Addendum Note (Signed)
Addended by: Rema Fendt on: 10/14/2019 09:07 AM   Modules accepted: Orders

## 2019-11-08 ENCOUNTER — Ambulatory Visit: Payer: Medicaid Other | Admitting: Internal Medicine

## 2019-12-07 MED FILL — HYDROCHLOROTHIAZIDE 12.5 MG: 12.5 | 30 days supply | Qty: 30 | Fill #2

## 2019-12-07 MED FILL — CARTIA XT 120 MG CP24: 120 | 30 days supply | Qty: 30 | Fill #2

## 2020-01-10 ENCOUNTER — Other Ambulatory Visit: Payer: Medicaid Other

## 2020-01-10 ENCOUNTER — Other Ambulatory Visit: Payer: Self-pay | Admitting: Family

## 2020-01-10 DIAGNOSIS — I10 Essential (primary) hypertension: Secondary | ICD-10-CM

## 2020-01-10 DIAGNOSIS — I480 Paroxysmal atrial fibrillation: Secondary | ICD-10-CM

## 2020-01-11 ENCOUNTER — Other Ambulatory Visit: Payer: Self-pay | Admitting: Internal Medicine

## 2020-01-11 MED FILL — HYDROCHLOROTHIAZIDE 12.5 MG: 12.5 | 30 days supply | Qty: 30 | Fill #0

## 2020-01-11 MED FILL — CARTIA XT 120 MG CP24: 120 | 30 days supply | Qty: 30 | Fill #0

## 2020-01-13 ENCOUNTER — Other Ambulatory Visit: Payer: Medicaid Other

## 2020-01-20 ENCOUNTER — Ambulatory Visit: Payer: Medicaid Other | Attending: Family | Admitting: Family

## 2020-01-20 ENCOUNTER — Other Ambulatory Visit: Payer: Self-pay

## 2020-01-20 DIAGNOSIS — Z91199 Patient's noncompliance with other medical treatment and regimen due to unspecified reason: Secondary | ICD-10-CM

## 2020-01-20 DIAGNOSIS — Z5329 Procedure and treatment not carried out because of patient's decision for other reasons: Secondary | ICD-10-CM

## 2020-01-20 NOTE — Progress Notes (Signed)
Patient did not show for appointment.   

## 2020-02-13 MED FILL — HYDROCHLOROTHIAZIDE 12.5 MG: 12.5 | 30 days supply | Qty: 30 | Fill #1

## 2020-02-13 MED FILL — CARTIA XT 120 MG CP24: 120 | 30 days supply | Qty: 30 | Fill #1

## 2020-03-14 MED FILL — HYDROCHLOROTHIAZIDE 12.5 MG: 12.5 | 30 days supply | Qty: 30 | Fill #2

## 2020-03-14 MED FILL — CARTIA XT 120 MG CP24: 120 | 30 days supply | Qty: 30 | Fill #2

## 2020-04-10 ENCOUNTER — Other Ambulatory Visit: Payer: Self-pay | Admitting: Family

## 2020-04-10 ENCOUNTER — Other Ambulatory Visit: Payer: Self-pay

## 2020-04-10 ENCOUNTER — Ambulatory Visit: Payer: Medicaid Other | Attending: Family | Admitting: Family

## 2020-04-10 ENCOUNTER — Encounter: Payer: Self-pay | Admitting: Family

## 2020-04-10 VITALS — BP 131/89 | HR 111 | Temp 98.5°F | Resp 18 | Ht 66.0 in | Wt 286.0 lb

## 2020-04-10 DIAGNOSIS — I480 Paroxysmal atrial fibrillation: Secondary | ICD-10-CM

## 2020-04-10 DIAGNOSIS — I10 Essential (primary) hypertension: Secondary | ICD-10-CM

## 2020-04-10 MED ORDER — DILTIAZEM HCL ER COATED BEADS 120 MG PO CP24: ORAL_CAPSULE | ORAL | 2 refills | Status: DC

## 2020-04-10 MED ORDER — HYDROCHLOROTHIAZIDE 12.5 MG PO CAPS: ORAL_CAPSULE | ORAL | 2 refills | Status: DC

## 2020-04-10 MED FILL — CARTIA XT 120 MG CP24: 120 | 30 days supply | Qty: 30 | Fill #0

## 2020-04-10 MED FILL — HYDROCHLOROTHIAZIDE 12.5 MG: 12.5 | 30 days supply | Qty: 30 | Fill #0

## 2020-04-10 NOTE — Progress Notes (Signed)
Patient ID: Deanna [name], female    DOB: 09-09-1985  MRN: [id number]  CC: Hypertension Follow-Up  Subjective: Deanna [name] is a 34 y.o. female with history of essential hypertension, paroxysmal atrial fibrillation, obesity, and exposure to syphilis  who presents for hypertension follow-up.   1. HYPERTENSION FOLLOW-UP: 10/11/2019: Visit with Franky Macho. Continued on Diltiazem and Hydrochlorothiazide.   04/10/2020: Currently taking: see medication list Have you taken your blood pressure medication today: [x]  Yes []  No  Med Adherence: []  Yes    []  No Medication side effects: []  Yes    []  No Adherence with salt restriction: []  Yes    [x]  No Exercise: Yes [x]  No []  Home Monitoring?: [x]  Yes    []  No Monitoring Frequency: []  Yes    [x]  No Home BP results range: []  Yes    [x]  No Smoking []  Yes [x]  No SOB? []  Yes    [x]  No Chest Pain?: []  Yes    [x]  No Leg swelling?: []  Yes    [x]  No Headaches?: []  Yes    [x]  No Dizziness? []  Yes    [x]  No  Patient Active Problem List   Diagnosis Date Noted  . Essential hypertension 07/26/2019  . PAF (paroxysmal atrial fibrillation) (HCC) 07/26/2019  . Exposure to syphilis 11/04/2017  . Obesity 10/12/2013     Current Outpatient Medications on File Prior to Visit  Medication Sig Dispense Refill  . diltiazem (CARDIZEM) 30 MG tablet Take 1 tablet every 4 hours AS NEEDED for AFIB heart rate over 100 30 tablet 1  . medroxyPROGESTERone (DEPO-PROVERA) 150 MG/ML injection Inject 150 mg into the muscle every 3 (three) months.    . NON FORMULARY Blood pressure medicine-does not know name (Patient not taking: Reported on 04/10/2020)     No current facility-administered medications on file prior to visit.    No Known Allergies  Social History   Socioeconomic History  . Marital status: Married    Spouse name: Not on file  . Number of children: Not on file  . Years of education: Not on file  . Highest education level: Not on file  Occupational  History  . Not on file  Tobacco Use  . Smoking status: Never Smoker  . Smokeless tobacco: Never Used  Vaping Use  . Vaping Use: Never used  Substance and Sexual Activity  . Alcohol use: Yes    Comment: occasionally  . Drug use: No  . Sexual activity: Yes    Birth control/protection: Injection  Other Topics Concern  . Not on file  Social History Narrative   Lives with husband, son, and daughter   Gets to appointments in her car   No advanced directive    Sister and husband may make medical decisions if unable    No exercise   Social Determinants of Health   Financial Resource Strain:   . Difficulty of Paying Living Expenses: Not on file  Food Insecurity:   . Worried About in the Last Year: Not on file  . Ran Out of Food in the Last Year: Not on file  Transportation Needs:   . Lack of Transportation (Medical): Not on file  . Lack of Transportation (Non-Medical): Not on file  Physical Activity:   . Days of Exercise per Week: Not on file  . Minutes of Exercise per Session: Not on file  Stress:   . Feeling of Stress : Not on file  Social Connections:   . Frequency  of Communication with Friends and Family: Not on file  . Frequency of Social Gatherings with Friends and Family: Not on file  . Attends Religious Services: Not on file  . Active Member of Clubs or Organizations: Not on file  . Attends Banker Meetings: Not on file  . Marital Status: Not on file  Intimate Partner Violence:   . Fear of Current or Ex-Partner: Not on file  . Emotionally Abused: Not on file  . Physically Abused: Not on file  . Sexually Abused: Not on file    Family History  Problem Relation Age of Onset  . Diabetes Maternal Grandfather   . Hypertension Mother   . Diabetes Mother   . Hypertension Father   . Diabetes Sister   . Hypertension Sister   . Sarcoidosis Brother   . Anesthesia problems Neg Hx   . Hypotension Neg Hx   . Malignant hyperthermia Neg Hx    . Pseudochol deficiency Neg Hx     Past Surgical History:  Procedure Laterality Date  . LEEP    . TOOTH EXTRACTION      ROS: Review of Systems Negative except as stated above  PHYSICAL EXAM: BP 131/89 (BP Location: Left Arm, Patient Position: Sitting)   Pulse (!) 111   Temp 98.5 F (36.9 C) (Oral)   Resp 18   Ht 5\' 6"  (1.676 m)   Wt 286 lb (129.7 kg)   LMP 03/19/2020 Comment: Depo  SpO2 95%   BMI 46.16 kg/m   Physical Exam Constitutional:      Appearance: She is obese.  Cardiovascular:     Rate and Rhythm: Tachycardia present. Rhythm irregular.     Pulses: Normal pulses.     Heart sounds: Normal heart sounds.  Pulmonary:     Effort: Pulmonary effort is normal.     Breath sounds: Normal breath sounds.  Musculoskeletal:     Cervical back: Normal range of motion and neck supple.  Neurological:     General: No focal deficit present.     Mental Status: She is alert and oriented to person, place, and time.  Psychiatric:        [name] and Affect: [name] normal.        Behavior: Behavior normal.        Thought Content: Thought content normal.        Judgment: Judgment normal.     ASSESSMENT AND PLAN: 1. Essential hypertension: - Blood pressure at goal during today's visit.  - Continue Hydrochlorothiazide and Diltiazem as prescribed. - Counseled on blood pressure goal of less than 130/80, low-sodium, DASH diet, medication compliance, 150 minutes of moderate intensity exercise per week as tolerated. Discussed medication compliance, adverse effects. - Follow-up with in 4 weeks with clinical pharmacist for blood pressure check. Write down your blood pressure readings each day and bring those results along with your home blood pressure monitor to your appointment. Medications may be adjusted at that time if needed. - BMP to check kidney function and electrolyte balance. - Follow-up with primary physician in 3 months or sooner if needed. - Basic Metabolic Panel -  hydrochlorothiazide (MICROZIDE) 12.5 MG capsule; TAKE 1 TABLET (12.5 MG TOTAL) BY MOUTH DAILY.  Dispense: 30 capsule; Refill: 2 - diltiazem (CARTIA XT) 120 MG 24 hr capsule; TAKE 1 CAPSULE (120 MG TOTAL) BY MOUTH DAILY.  Dispense: 30 capsule; Refill: 2  2. PAF (paroxysmal atrial fibrillation) (HCC): - See #2 above.  - Continue Diltiazem as prescribed.  -  Follow-up with primary physician in 3 months or sooner if needed. - diltiazem (CARTIA XT) 120 MG 24 hr capsule; TAKE 1 CAPSULE (120 MG TOTAL) BY MOUTH DAILY.  Dispense: 30 capsule; Refill: 2  Patient was given the opportunity to ask questions.  Patient verbalized understanding of the plan and was able to repeat key elements of the plan. Patient was given clear instructions to go to Emergency Department or return to medical center if symptoms don't improve, worsen, or new problems develop.The patient verbalized understanding.   Orders Placed This Encounter  Procedures  . Basic Metabolic Panel     Requested Prescriptions   Signed Prescriptions Disp Refills  . hydrochlorothiazide (MICROZIDE) 12.5 MG capsule 30 capsule 2    Sig: TAKE 1 TABLET (12.5 MG TOTAL) BY MOUTH DAILY.  Marland Kitchen diltiazem (CARTIA XT) 120 MG 24 hr capsule 30 capsule 2    Sig: TAKE 1 CAPSULE (120 MG TOTAL) BY MOUTH DAILY.    Return in about 3 months (around 07/11/2020) for Dr. Laural Benes and 1 month with Franky Macho.  Rema Fendt, NP

## 2020-04-10 NOTE — Patient Instructions (Addendum)
Continue Diltiazem and Hydrochlorothiazide for high blood pressure.   Take blood pressure at home and write down results with the date. Have these available at your blood pressure check-up appointment in 1 month. Blood pressure goal < 130/80.  Lab today.  Hypertension, Adult Hypertension is another name for high blood pressure. High blood pressure forces your heart to work harder to pump blood. This can cause problems over time. There are two numbers in a blood pressure reading. There is a top number (systolic) over a bottom number (diastolic). It is best to have a blood pressure that is below 120/80. Healthy choices can help lower your blood pressure, or you may need medicine to help lower it. What are the causes? The cause of this condition is not known. Some conditions may be related to high blood pressure. What increases the risk?  Smoking.  Having type 2 diabetes mellitus, high cholesterol, or both.  Not getting enough exercise or physical activity.  Being overweight.  Having too much fat, sugar, calories, or salt (sodium) in your diet.  Drinking too much alcohol.  Having long-term (chronic) kidney disease.  Having a family history of high blood pressure.  Age. Risk increases with age.  Race. You may be at higher risk if you are African American.  Gender. Men are at higher risk than women before age 23. After age 37, women are at higher risk than men.  Having obstructive sleep apnea.  Stress. What are the signs or symptoms?  High blood pressure may not cause symptoms. Very high blood pressure (hypertensive crisis) may cause: ? Headache. ? Feelings of worry or nervousness (anxiety). ? Shortness of breath. ? Nosebleed. ? A feeling of being sick to your stomach (nausea). ? Throwing up (vomiting). ? Changes in how you see. ? Very bad chest pain. ? Seizures. How is this treated?  This condition is treated by making healthy lifestyle changes, such as: ? Eating  healthy foods. ? Exercising more. ? Drinking less alcohol.  Your health care provider may prescribe medicine if lifestyle changes are not enough to get your blood pressure under control, and if: ? Your top number is above 130. ? Your bottom number is above 80.  Your personal target blood pressure may vary. Follow these instructions at home: Eating and drinking   If told, follow the DASH eating plan. To follow this plan: ? Fill one half of your plate at each meal with fruits and vegetables. ? Fill one fourth of your plate at each meal with whole grains. Whole grains include whole-wheat pasta, brown rice, and whole-grain bread. ? Eat or drink low-fat dairy products, such as skim milk or low-fat yogurt. ? Fill one fourth of your plate at each meal with low-fat (lean) proteins. Low-fat proteins include fish, chicken without skin, eggs, beans, and tofu. ? Avoid fatty meat, cured and processed meat, or chicken with skin. ? Avoid pre-made or processed food.  Eat less than 1,500 mg of salt each day.  Do not drink alcohol if: ? Your doctor tells you not to drink. ? You are pregnant, may be pregnant, or are planning to become pregnant.  If you drink alcohol: ? Limit how much you use to:  0-1 drink a day for women.  0-2 drinks a day for men. ? Be aware of how much alcohol is in your drink. In the [place] one drink equals one 12 oz bottle of beer (355 mL), one 5 oz glass of wine (148 mL), or one  1 oz glass of hard liquor (44 mL). Lifestyle   Work with your doctor to stay at a healthy weight or to lose weight. Ask your doctor what the best weight is for you.  Get at least 30 minutes of exercise most days of the week. This may include walking, swimming, or biking.  Get at least 30 minutes of exercise that strengthens your muscles (resistance exercise) at least 3 days a week. This may include lifting weights or doing Pilates.  Do not use any products that contain nicotine or tobacco, such  as cigarettes, e-cigarettes, and chewing tobacco. If you need help quitting, ask your doctor.  Check your blood pressure at home as told by your doctor.  Keep all follow-up visits as told by your doctor. This is important. Medicines  Take over-the-counter and prescription medicines only as told by your doctor. Follow directions carefully.  Do not skip doses of blood pressure medicine. The medicine does not work as well if you skip doses. Skipping doses also puts you at risk for problems.  Ask your doctor about side effects or reactions to medicines that you should watch for. Contact a doctor if you:  Think you are having a reaction to the medicine you are taking.  Have headaches that keep coming back (recurring).  Feel dizzy.  Have swelling in your ankles.  Have trouble with your vision. Get help right away if you:  Get a very bad headache.  Start to feel mixed up (confused).  Feel weak or numb.  Feel faint.  Have very bad pain in your: ? Chest. ? Belly (abdomen).  Throw up more than once.  Have trouble breathing. Summary  Hypertension is another name for high blood pressure.  High blood pressure forces your heart to work harder to pump blood.  For most people, a normal blood pressure is less than 120/80.  Making healthy choices can help lower blood pressure. If your blood pressure does not get lower with healthy choices, you may need to take medicine. This information is not intended to replace advice given to you by your health care provider. Make sure you discuss any questions you have with your health care provider. Document Revised: 01/20/2018 Document Reviewed: 01/20/2018 Elsevier Patient Education  2020 [employer].

## 2020-04-10 NOTE — Progress Notes (Signed)
Patient has eaten and taken medication today. Patient reports BP medication not helping due to constant high readings. Patient denies pain at this time.

## 2020-04-11 ENCOUNTER — Encounter: Payer: Self-pay | Admitting: Internal Medicine

## 2020-04-11 LAB — BASIC METABOLIC PANEL
BUN/Creatinine Ratio: 18 (ref 9–23)
BUN: 12 mg/dL (ref 6–20)
CO2: 25 mmol/L (ref 20–29)
Calcium: 9.2 mg/dL (ref 8.7–10.2)
Chloride: 105 mmol/L (ref 96–106)
Creatinine, Ser: 0.65 mg/dL (ref 0.57–1.00)
GFR calc Af Amer: 136 mL/min/{1.73_m2} (ref >59)
GFR calc non Af Amer: 118 mL/min/{1.73_m2} (ref >59)
Glucose: 110 mg/dL — ABNORMAL HIGH (ref 65–99)
Potassium: 4 mmol/L (ref 3.5–5.2)
Sodium: 142 mmol/L (ref 134–144)

## 2020-04-12 ENCOUNTER — Telehealth: Payer: Self-pay | Admitting: *Deleted

## 2020-04-12 NOTE — Progress Notes (Signed)
Please call patient with update.   Kidney function normal.   Please keep all scheduled appointments.

## 2020-04-12 NOTE — Telephone Encounter (Signed)
Patient verified DOB Patient is aware of kidney function being normal and to keep all follow ups and continue with regimen as discussed. No concerns at this time.

## 2020-04-12 NOTE — Telephone Encounter (Signed)
-----   Message from Rema Fendt, NP sent at 04/12/2020  7:45 AM EST ----- Please call patient with update.   Kidney function normal.   Please keep all scheduled appointments.

## 2020-04-25 ENCOUNTER — Encounter (HOSPITAL_COMMUNITY): Payer: Self-pay | Admitting: *Deleted

## 2020-04-25 ENCOUNTER — Ambulatory Visit (HOSPITAL_COMMUNITY)
Admission: EM | Admit: 2020-04-25 | Discharge: 2020-04-25 | Disposition: A | Discharge status: Home / Self Care | Payer: 59 | Attending: Emergency Medicine | Admitting: Emergency Medicine

## 2020-04-25 ENCOUNTER — Other Ambulatory Visit: Payer: Self-pay

## 2020-04-25 DIAGNOSIS — R051 Acute cough: Secondary | ICD-10-CM | POA: Diagnosis not present

## 2020-04-25 DIAGNOSIS — Z79899 Other long term (current) drug therapy: Secondary | ICD-10-CM | POA: Diagnosis not present

## 2020-04-25 DIAGNOSIS — I10 Essential (primary) hypertension: Secondary | ICD-10-CM | POA: Insufficient documentation

## 2020-04-25 DIAGNOSIS — J069 Acute upper respiratory infection, unspecified: Secondary | ICD-10-CM | POA: Insufficient documentation

## 2020-04-25 DIAGNOSIS — Z20822 Contact with and (suspected) exposure to covid-19: Secondary | ICD-10-CM | POA: Insufficient documentation

## 2020-04-25 DIAGNOSIS — I480 Paroxysmal atrial fibrillation: Secondary | ICD-10-CM | POA: Insufficient documentation

## 2020-04-25 DIAGNOSIS — J029 Acute pharyngitis, unspecified: Secondary | ICD-10-CM | POA: Diagnosis present

## 2020-04-25 DIAGNOSIS — Z793 Long term (current) use of hormonal contraceptives: Secondary | ICD-10-CM | POA: Diagnosis not present

## 2020-04-25 LAB — RESP PANEL BY RT-PCR (FLU A&B, COVID) ARPGX2
Influenza A by PCR: NEGATIVE
Influenza B by PCR: NEGATIVE
SARS Coronavirus 2 by RT PCR: NEGATIVE

## 2020-04-25 NOTE — ED Provider Notes (Signed)
[facility]    CSN: [id number] Arrival date & time: 04/25/20  4235      History   Chief Complaint Chief Complaint  Patient presents with   Cough   Sore Throat   Chills    HPI Deanna [name] is a 34 y.o. female.   Deanna [name] presents with complaints of Headache, cough, nasal congestion, sore throat which started yesterday. No known fevers. No gi symptoms. Has been vaccinated for covid-19. No known ill contacts. History of atrial fibrillation and htn.    ROS per HPI, negative if not otherwise mentioned.      Past Medical History:  Diagnosis Date   Abnormal Pap smear    Atrial fibrillation (HCC)    Hypertension    Infection    UTI   Normal pregnancy 10/04/2011   SVD (spontaneous vaginal delivery) 10/04/2011   Trichomonas    Vaginal Pap smear, abnormal     Patient Active Problem List   Diagnosis Date Noted   Essential hypertension 07/26/2019   PAF (paroxysmal atrial fibrillation) (HCC) 07/26/2019   Exposure to syphilis 11/04/2017   Obesity 10/12/2013    Past Surgical History:  Procedure Laterality Date   LEEP     TOOTH EXTRACTION      OB History    Gravida  2   Para  2   Term  2   Preterm  0   AB  0   Living  2     SAB  0   TAB  0   Ectopic  0   Multiple  0   Live Births  2            Home Medications    Prior to Admission medications   Medication Sig Start Date End Date Taking? Authorizing Provider  diltiazem (CARDIZEM) 30 MG tablet Take 1 tablet every 4 hours AS NEEDED for AFIB heart rate over 100 03/16/18  Yes Newman Nip, NP  diltiazem (CARTIA XT) 120 MG 24 hr capsule TAKE 1 CAPSULE (120 MG TOTAL) BY MOUTH DAILY. 04/10/20  Yes Zonia Kief, Amy J, NP  hydrochlorothiazide (MICROZIDE) 12.5 MG capsule TAKE 1 TABLET (12.5 MG TOTAL) BY MOUTH DAILY. 04/10/20  Yes Zonia Kief, Amy J, NP  medroxyPROGESTERone (DEPO-PROVERA) 150 MG/ML injection Inject 150 mg into the muscle every 3 (three) months.     [provider]  NON FORMULARY Blood pressure medicine-does not know name Patient not taking: Reported on 04/10/2020    [provider]    Family History Family History  Problem Relation Age of Onset   Diabetes Maternal Grandfather    Hypertension Mother    Diabetes Mother    Hypertension Father    Diabetes Sister    Hypertension Sister    Sarcoidosis Brother    Anesthesia problems Neg Hx    Hypotension Neg Hx    Malignant hyperthermia Neg Hx    Pseudochol deficiency Neg Hx     Social History Social History   Tobacco Use   Smoking status: Never Smoker   Smokeless tobacco: Never Used  Building services engineer Use: Never used  Substance Use Topics   Alcohol use: Yes    Comment: occasionally   Drug use: No     Allergies   Patient has no known allergies.   Review of Systems Review of Systems   Physical Exam Triage Vital Signs ED Triage Vitals  Enc Vitals Group     BP 04/25/20 0856 129/78  Pulse Rate 04/25/20 0856 (!) 109     Resp 04/25/20 0856 20     Temp 04/25/20 0856 99.2 F (37.3 C)     Temp src --      SpO2 04/25/20 0856 100 %     Weight 04/25/20 0857 270 lb (122.5 kg)     Height 04/25/20 0857 5\' 6"  (1.676 m)     Head Circumference --      Peak Flow --      Pain Score 04/25/20 0857 8     Pain Loc --      Pain Edu? --      Excl. in GC? --    No data found.  Updated Vital Signs BP 129/78 (BP Location: Right Arm)    Pulse (!) 109    Temp 99.2 F (37.3 C)    Resp 20    Ht 5\' 6"  (1.676 m)    Wt 270 lb (122.5 kg)    SpO2 100%    BMI 43.58 kg/m   Visual Acuity Right Eye Distance:   Left Eye Distance:   Bilateral Distance:    Right Eye Near:   Left Eye Near:    Bilateral Near:     Physical Exam Constitutional:      General: She is not in acute distress.    Appearance: She is well-developed.  HENT:     Nose: Congestion present.     Mouth/Throat:     Tonsils: No tonsillar exudate.  Cardiovascular:     Rate  and Rhythm: Tachycardia present.  Pulmonary:     Effort: Pulmonary effort is normal.  Skin:    General: Skin is warm and dry.  Neurological:     Mental Status: She is alert and oriented to person, place, and time.      UC Treatments / Results  Labs (all labs ordered are listed, but only abnormal results are displayed) Labs Reviewed  RESP PANEL BY RT-PCR (FLU A&B, COVID) [id number]    EKG   Radiology No results found.  Procedures Procedures (including critical care time)  Medications Ordered in UC Medications - No data to display  Initial Impression / Assessment and Plan / UC Course  I have reviewed the triage vital signs and the nursing notes.  Pertinent labs & imaging results that were available during my care of the patient were reviewed by me and considered in my medical decision making (see chart for details).     Non toxic. Benign physical exam. History and physical consistent with viral illness.  Covid testing pending and isolation instructions provided.  Supportive cares recommended. Return precautions provided. Patient verbalized understanding and agreeable to plan.   Final Clinical Impressions(s) / UC Diagnoses   Final diagnoses:  Upper respiratory tract infection, unspecified type     Discharge Instructions     Push fluids to ensure adequate hydration and keep secretions thin.  Tylenol and/or ibuprofen as needed for pain or fevers.  Throat lozenges, gargles, chloraseptic spray, warm teas, popsicles etc to help with throat pain.   Self isolate until covid results are back and negative.  Will notify you by phone of any positive findings. Your negative results will be sent through your MyChart.     You may try some vitamins to help your immune system potentially:  Vitamin C 500mg  twice a day. Zinc 50mg  daily. Vitamin D 5000IU daily.   If symptoms worsen or do not improve in the next week to return to be seen or  to follow up with your PCP.     ED  Prescriptions    None     PDMP not reviewed this encounter.   Georgetta Haber, NP 04/25/20 1003

## 2020-04-25 NOTE — Discharge Instructions (Addendum)
Push fluids to ensure adequate hydration and keep secretions thin.  Tylenol and/or ibuprofen as needed for pain or fevers.  Throat lozenges, gargles, chloraseptic spray, warm teas, popsicles etc to help with throat pain.   Self isolate until covid results are back and negative.  Will notify you by phone of any positive findings. Your negative results will be sent through your MyChart.     You may try some vitamins to help your immune system potentially:  Vitamin C 500mg  twice a day. Zinc 50mg  daily. Vitamin D 5000IU daily.   If symptoms worsen or do not improve in the next week to return to be seen or to follow up with your PCP.

## 2020-04-25 NOTE — ED Triage Notes (Signed)
Pt reports sore throat ,chills and a productive cough.

## 2020-05-14 MED FILL — CARTIA XT 120 MG CP24: 120 | 30 days supply | Qty: 30 | Fill #1

## 2020-05-14 MED FILL — HYDROCHLOROTHIAZIDE 12.5 MG: 12.5 | 30 days supply | Qty: 30 | Fill #1

## 2020-06-13 MED FILL — HYDROCHLOROTHIAZIDE 12.5 MG: 12.5 | 30 days supply | Qty: 30 | Fill #2

## 2020-06-13 MED FILL — CARTIA XT 120 MG CP24: 120 | 30 days supply | Qty: 30 | Fill #2

## 2020-06-22 ENCOUNTER — Other Ambulatory Visit: Payer: 59

## 2020-06-23 ENCOUNTER — Other Ambulatory Visit: Payer: 59

## 2020-07-17 DIAGNOSIS — Z3009 Encounter for other general counseling and advice on contraception: Secondary | ICD-10-CM | POA: Diagnosis not present

## 2020-07-17 DIAGNOSIS — Z3042 Encounter for surveillance of injectable contraceptive: Secondary | ICD-10-CM | POA: Diagnosis not present

## 2020-07-18 ENCOUNTER — Other Ambulatory Visit: Payer: Self-pay | Admitting: Family

## 2020-07-18 DIAGNOSIS — I480 Paroxysmal atrial fibrillation: Secondary | ICD-10-CM

## 2020-07-18 DIAGNOSIS — I10 Essential (primary) hypertension: Secondary | ICD-10-CM

## 2020-07-20 ENCOUNTER — Other Ambulatory Visit: Payer: Self-pay

## 2020-07-20 DIAGNOSIS — I10 Essential (primary) hypertension: Secondary | ICD-10-CM

## 2020-07-20 DIAGNOSIS — I480 Paroxysmal atrial fibrillation: Secondary | ICD-10-CM

## 2020-07-20 MED ORDER — HYDROCHLOROTHIAZIDE 12.5 MG PO CAPS: ORAL_CAPSULE | ORAL | 0 refills | Status: DC

## 2020-07-20 MED ORDER — DILTIAZEM HCL ER COATED BEADS 120 MG PO CP24: ORAL_CAPSULE | ORAL | 0 refills | Status: DC

## 2020-07-20 MED FILL — HYDROCHLOROTHIAZIDE 12.5 MG: 12.5 | 30 days supply | Qty: 30 | Fill #0

## 2020-07-20 MED FILL — CARTIA XT 120 MG CP24: 120 | 30 days supply | Qty: 30 | Fill #0

## 2020-07-20 NOTE — Telephone Encounter (Signed)
Pt call to request for a refill for her med, please follow up

## 2020-08-08 ENCOUNTER — Other Ambulatory Visit: Payer: Self-pay | Admitting: Internal Medicine

## 2020-08-08 DIAGNOSIS — I480 Paroxysmal atrial fibrillation: Secondary | ICD-10-CM

## 2020-08-08 DIAGNOSIS — I10 Essential (primary) hypertension: Secondary | ICD-10-CM

## 2020-08-08 MED ORDER — HYDROCHLOROTHIAZIDE 12.5 MG PO CAPS: ORAL_CAPSULE | ORAL | 1 refills | Status: DC

## 2020-08-08 MED ORDER — DILTIAZEM HCL ER COATED BEADS 120 MG PO CP24: ORAL_CAPSULE | ORAL | 1 refills | Status: DC

## 2020-08-08 NOTE — Telephone Encounter (Signed)
Medication Refill - Medication: diltiazem (CARTIA XT) 120 MG 24 hr capsule  hydrochlorothiazide (MICROZIDE) 12.5 MG capsule Pt has scheduled an appt for the next available opening on 5.10.22/ please advise    Has the patient contacted their pharmacy? Yes.   (Agent: If no, request that the patient contact the pharmacy for the refill.) (Agent: If yes, when and what did the pharmacy advise?) Call pcp for appt   Preferred Pharmacy (with phone number or street name): [facility]   Agent: Please be advised that RX refills may take up to 3 business days. We ask that you follow-up with your pharmacy.

## 2020-08-08 NOTE — Telephone Encounter (Signed)
Pt has been scheduled a virtual appt with Dr. Delford Field for 3/17 at 1030am

## 2020-08-09 ENCOUNTER — Encounter: Payer: Self-pay | Admitting: Critical Care Medicine

## 2020-08-09 ENCOUNTER — Ambulatory Visit: Payer: Self-pay | Attending: Critical Care Medicine | Admitting: Critical Care Medicine

## 2020-08-09 ENCOUNTER — Other Ambulatory Visit: Payer: Self-pay

## 2020-08-09 ENCOUNTER — Other Ambulatory Visit: Payer: Self-pay | Admitting: Critical Care Medicine

## 2020-08-09 ENCOUNTER — Telehealth: Payer: Self-pay | Admitting: Internal Medicine

## 2020-08-09 DIAGNOSIS — N926 Irregular menstruation, unspecified: Secondary | ICD-10-CM | POA: Insufficient documentation

## 2020-08-09 DIAGNOSIS — I10 Essential (primary) hypertension: Secondary | ICD-10-CM

## 2020-08-09 DIAGNOSIS — R87619 Unspecified abnormal cytological findings in specimens from cervix uteri: Secondary | ICD-10-CM | POA: Insufficient documentation

## 2020-08-09 DIAGNOSIS — F5104 Psychophysiologic insomnia: Secondary | ICD-10-CM

## 2020-08-09 DIAGNOSIS — N898 Other specified noninflammatory disorders of vagina: Secondary | ICD-10-CM | POA: Insufficient documentation

## 2020-08-09 DIAGNOSIS — I480 Paroxysmal atrial fibrillation: Secondary | ICD-10-CM

## 2020-08-09 MED ORDER — MELATONIN 5 MG PO TABS
15.0000 mg | ORAL_TABLET | Freq: Every day | ORAL | 0 refills | Status: DC
Start: 2020-08-09 — End: 2020-08-09

## 2020-08-09 MED ORDER — DILTIAZEM HCL ER COATED BEADS 120 MG PO CP24: ORAL_CAPSULE | ORAL | 1 refills | Status: DC

## 2020-08-09 MED ORDER — HYDROCHLOROTHIAZIDE 12.5 MG PO CAPS: ORAL_CAPSULE | ORAL | 1 refills | Status: DC

## 2020-08-09 NOTE — Telephone Encounter (Signed)
Copied from CRM [id number]. Topic: General - Other >> Aug 08, 2020  9:10 AM Wyonia Hough E wrote: Reason for CRM: Pt asked if Dr. Laural Benes can give a call to aske her a question about her medication/ please advise

## 2020-08-09 NOTE — Assessment & Plan Note (Signed)
Psychophysiologic insomnia exacerbated by alcohol use at night  I went over aspects of proper sleep hygiene and gave the patient information on her my chart to read and review  She is to stop drinking alcohol at this time  I recommended use of 15 mg of melatonin take an hour before falling asleep and not watching TV at night  Importance of healthy diet and exercise stressed

## 2020-08-09 NOTE — Assessment & Plan Note (Signed)
Well-controlled at this time no change

## 2020-08-09 NOTE — Assessment & Plan Note (Signed)
Remains in sinus rhythm.

## 2020-08-09 NOTE — Patient Instructions (Signed)
Avoid alcohol  Stop watching TV before bedtime  Practice mindfulness techniques or yoga at night  Speak to your counselor  Take melatonin 15mg  at least one hour before bedtime  See Below  A follow up visit with Dr will be arranged    Insomnia Insomnia is a sleep disorder that makes it difficult to fall asleep or stay asleep. Insomnia can cause fatigue, low energy, difficulty concentrating, mood swings, and poor performance at work or school. There are three different ways to classify insomnia:  Difficulty falling asleep.  Difficulty staying asleep.  Waking up too early in the morning. Any type of insomnia can be long-term (chronic) or short-term (acute). Both are common. Short-term insomnia usually lasts for three months or less. Chronic insomnia occurs at least three times a week for longer than three months. What are the causes? Insomnia may be caused by another condition, situation, or substance, such as:  Anxiety.  Certain medicines.  Gastroesophageal reflux disease (GERD) or other gastrointestinal conditions.  Asthma or other breathing conditions.  Restless legs syndrome, sleep apnea, or other sleep disorders.  Chronic pain.  Menopause.  Stroke.  Abuse of alcohol, tobacco, or illegal drugs.  Mental health conditions, such as depression.  Caffeine.  Neurological disorders, such as Alzheimer's disease.  An overactive thyroid (hyperthyroidism). Sometimes, the cause of insomnia may not be known. What increases the risk? Risk factors for insomnia include:  Gender. Women are affected more often than men.  Age. Insomnia is more common as you get older.  Stress.  Lack of exercise.  Irregular work schedule or working night shifts.  Traveling between different time zones.  Certain medical and mental health conditions. What are the signs or symptoms? If you have insomnia, the main symptom is having trouble falling asleep or having trouble  staying asleep. This may lead to other symptoms, such as:  Feeling fatigued or having low energy.  Feeling nervous about going to sleep.  Not feeling rested in the morning.  Having trouble concentrating.  Feeling irritable, anxious, or depressed. How is this diagnosed? This condition may be diagnosed based on:  Your symptoms and medical history. Your health care provider may ask about: ? Your sleep habits. ? Any medical conditions you have. ? Your mental health.  A physical exam. How is this treated? Treatment for insomnia depends on the cause. Treatment may focus on treating an underlying condition that is causing insomnia. Treatment may also include:  Medicines to help you sleep.  Counseling or therapy.  Lifestyle adjustments to help you sleep better. Follow these instructions at home: Eating and drinking  Limit or avoid alcohol, caffeinated beverages, and cigarettes, especially close to bedtime. These can disrupt your sleep.  Do not eat a large meal or eat spicy foods right before bedtime. This can lead to digestive discomfort that can make it hard for you to sleep.   Sleep habits  Keep a sleep diary to help you and your health care provider figure out what could be causing your insomnia. Write down: ? When you sleep. ? When you wake up during the night. ? How well you sleep. ? How rested you feel the next day. ? Any side effects of medicines you are taking. ? What you eat and drink.  Make your bedroom a dark, comfortable place where it is easy to fall asleep. ? Put up shades or blackout curtains to block light from outside. ? Use a white noise machine to block noise. ? Keep the temperature  cool.  Limit screen use before bedtime. This includes: ? Watching TV. ? Using your smartphone, tablet, or computer.  Stick to a routine that includes going to bed and waking up at the same times every day and night. This can help you fall asleep faster. Consider making a  quiet activity, such as reading, part of your nighttime routine.  Try to avoid taking naps during the day so that you sleep better at night.  Get out of bed if you are still awake after 15 minutes of trying to sleep. Keep the lights down, but try reading or doing a quiet activity. When you feel sleepy, go back to bed.   General instructions  Take over-the-counter and prescription medicines only as told by your health care provider.  Exercise regularly, as told by your health care provider. Avoid exercise starting several hours before bedtime.  Use relaxation techniques to manage stress. Ask your health care provider to suggest some techniques that may work well for you. These may include: ? Breathing exercises. ? Routines to release muscle tension. ? Visualizing peaceful scenes.  Make sure that you drive carefully. Avoid driving if you feel very sleepy.  Keep all follow-up visits as told by your health care provider. This is important. Contact a health care provider if:  You are tired throughout the day.  You have trouble in your daily routine due to sleepiness.  You continue to have sleep problems, or your sleep problems get worse. Get help right away if:  You have serious thoughts about hurting yourself or someone else. If you ever feel like you may hurt yourself or others, or have thoughts about taking your own life, get help right away. You can go to your nearest emergency department or call:  Your local emergency services (911 in the [place]  A suicide crisis helpline, such as the National Suicide Prevention Lifeline at 234-757-2016. This is open 24 hours a day. Summary  Insomnia is a sleep disorder that makes it difficult to fall asleep or stay asleep.  Insomnia can be long-term (chronic) or short-term (acute).  Treatment for insomnia depends on the cause. Treatment may focus on treating an underlying condition that is causing insomnia.  Keep a sleep diary to help you  and your health care provider figure out what could be causing your insomnia. This information is not intended to replace advice given to you by your health care provider. Make sure you discuss any questions you have with your health care provider. Document Revised: 03/22/2020 Document Reviewed: 03/22/2020 Elsevier Patient Education  2021 [employer].

## 2020-08-09 NOTE — Telephone Encounter (Signed)
Returned pt call pt states she doesn't have any questions. Please disregard message

## 2020-08-09 NOTE — Progress Notes (Signed)
Subjective:    Patient ID: Deanna [name], female    DOB: [birth year], 35 y.o.   MRN: [id number] Virtual Visit via Video Note  I connected with@ on 08/09/20 at@ by a video enabled telemedicine application and verified that I am speaking with the correct person using two identifiers.   Consent:  I discussed the limitations, risks, security and privacy concerns of performing an evaluation and management service by video visit and the availability of in person appointments. I also discussed with the patient that there may be a patient responsible charge related to this service. The patient expressed understanding and agreed to proceed.  Location of patient: Patient's in her car  Location of provider: I am in my office  Persons participating in the televisit with the patient.   No one else on the call    History of Present Illness: 35 y.o.F here for eval insomnia  Patient has a previous history of paroxysmal atrial fibrillation on diltiazem hypertension obesity.  She lost a close family member about a month ago and she has had increased stress and anxiety since that time and has not been able to sleep.  She goes to bed around 10:30 PM.  She falls asleep but then wakes up multiple times.  She eats dinner at 7 PM and has to mixed drinks of vodka and water prior to finishing dinner.  She does not drink caffeine.  She watches television at night mostly the news.  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  She does have very vivid strange dreams.  She is occasionally sleepy during the daytime.  No prior history of known sleep apnea.  She is obese.  Denies headaches or other neurologic complaints at this time.  Past Medical History:  Diagnosis Date  . Abnormal Pap smear   . Atrial fibrillation (HCC)   . Hypertension   . Infection    UTI  . Normal pregnancy 10/04/2011  . SVD (spontaneous vaginal delivery) 10/04/2011  . Trichomonas   . Vaginal Pap smear, abnormal      Family  History  Problem Relation Age of Onset  . Diabetes Maternal Grandfather   . Hypertension Mother   . Diabetes Mother   . Hypertension Father   . Diabetes Sister   . Hypertension Sister   . Sarcoidosis Brother   . Anesthesia problems Neg Hx   . Hypotension Neg Hx   . Malignant hyperthermia Neg Hx   . Pseudochol deficiency Neg Hx      Social History   Socioeconomic History  . Marital status: Married    Spouse name: Not on file  . Number of children: Not on file  . Years of education: Not on file  . Highest education level: Not on file  Occupational History  . Not on file  Tobacco Use  . Smoking status: Never Smoker  . Smokeless tobacco: Never Used  Vaping Use  . Vaping Use: Never used  Substance and Sexual Activity  . Alcohol use: Yes    Comment: occasionally  . Drug use: No  . Sexual activity: Yes    Birth control/protection: Injection  Other Topics Concern  . Not on file  Social History Narrative   Lives with husband, son, and daughter   Gets to appointments in her car   No advanced directive    Sister and husband may make medical decisions if unable    No exercise   Social Determinants of Health  Financial Resource Strain: Not on file  Food Insecurity: Not on file  Transportation Needs: Not on file  Physical Activity: Not on file  Stress: Not on file  Social Connections: Not on file  Intimate Partner Violence: Not on file     No Known Allergies   Outpatient Medications Prior to Visit  Medication Sig Dispense Refill  . medroxyPROGESTERone (DEPO-PROVERA) 150 MG/ML injection Inject 150 mg into the muscle every 3 (three) months.    . NON FORMULARY Blood pressure medicine-does not know name (Patient not taking: Reported on 04/10/2020)    . diltiazem (CARDIZEM) 30 MG tablet Take 1 tablet every 4 hours AS NEEDED for AFIB heart rate over 100 30 tablet 1  . diltiazem (CARTIA XT) 120 MG 24 hr capsule TAKE 1 CAPSULE (120 MG TOTAL) BY MOUTH DAILY. 30 capsule 1  .  hydrochlorothiazide (MICROZIDE) 12.5 MG capsule TAKE 1 TABLET (12.5 MG TOTAL) BY MOUTH DAILY. 30 capsule 1   No facility-administered medications prior to visit.     Review of Systems  Constitutional: Negative.   HENT: Negative.   Eyes: Negative for visual disturbance.  Respiratory: Negative.  Negative for apnea, cough, choking, chest tightness, shortness of breath, wheezing and stridor.   Cardiovascular: Negative for chest pain, palpitations and leg swelling.  Endocrine: Positive for polyuria.  Psychiatric/Behavioral: Positive for dysphoric [name] and sleep disturbance. Negative for agitation, behavioral problems, confusion, decreased concentration, self-injury and suicidal ideas. The patient is not nervous/anxious.        Objective:   Physical Exam This is a video visit patient is very calm in no acute distress sitting in her car in the driver side she is parked and is by herself in the vehicle       Assessment & Plan:  I personally reviewed all images and lab data in the [facility] system as well as any outside material available during this office visit and agree with the  radiology impressions.   Psychophysiological insomnia Psychophysiologic insomnia exacerbated by alcohol use at night  I went over aspects of proper sleep hygiene and gave the patient information on her my chart to read and review  She is to stop drinking alcohol at this time  I recommended use of 15 mg of melatonin take an hour before falling asleep and not watching TV at night  Importance of healthy diet and exercise stressed  Essential hypertension Well-controlled at this time no change  PAF (paroxysmal atrial fibrillation) (HCC) Remains in sinus rhythm   Diagnoses and all orders for this visit:  Psychophysiological insomnia  Essential hypertension -     diltiazem (CARTIA XT) 120 MG 24 hr capsule; TAKE 1 CAPSULE (120 MG TOTAL) BY MOUTH DAILY. -     hydrochlorothiazide (MICROZIDE) 12.5 MG capsule;  TAKE 1 TABLET (12.5 MG TOTAL) BY MOUTH DAILY.  PAF (paroxysmal atrial fibrillation) (HCC) -     diltiazem (CARTIA XT) 120 MG 24 hr capsule; TAKE 1 CAPSULE (120 MG TOTAL) BY MOUTH DAILY.  Other orders -     melatonin 5 MG TABS; Take 3 tablets (15 mg total) by mouth at bedtime.   Follow Up Instructions: Patient knows a follow-up visit will be scheduled in 2 weeks and it will be a virtual visit   I discussed the assessment and treatment plan with the patient. The patient was provided an opportunity to ask questions and all were answered. The patient agreed with the plan and demonstrated an understanding of the instructions.   The patient was  advised to call back or seek an in-person evaluation if the symptoms worsen or if the condition fails to improve as anticipated.  I provided 30 minutes of non-face-to-face time during this encounter  including  median intraservice time , review of notes, labs, imaging, medications  and explaining diagnosis and management to the patient .    Asencion Noble, MD

## 2020-08-16 MED FILL — HYDROCHLOROTHIAZIDE 12.5 MG: 12.5 | 30 days supply | Qty: 30 | Fill #0

## 2020-08-16 MED FILL — CARTIA XT 120 MG CP24: 120 | 30 days supply | Qty: 30 | Fill #0

## 2020-08-21 ENCOUNTER — Telehealth: Payer: Self-pay | Admitting: Internal Medicine

## 2020-08-21 NOTE — Telephone Encounter (Signed)
Called patient and LVM advising her to call [phone] to schedule an appointment. Patient needs a tele follow up with Dr. Laural Benes for follow up insomnia per Dr. Delford Field. Please transfer patient to office if she calls back so we can get her worked in.

## 2020-08-27 ENCOUNTER — Ambulatory Visit: Payer: Self-pay | Attending: Internal Medicine | Admitting: Internal Medicine

## 2020-08-27 ENCOUNTER — Other Ambulatory Visit: Payer: Self-pay

## 2020-08-27 DIAGNOSIS — Z91199 Patient's noncompliance with other medical treatment and regimen due to unspecified reason: Secondary | ICD-10-CM

## 2020-08-27 DIAGNOSIS — Z5329 Procedure and treatment not carried out because of patient's decision for other reasons: Secondary | ICD-10-CM

## 2020-08-27 NOTE — Progress Notes (Signed)
Pt called at 10:23 a.m for telephone visit.  No answer.  LVMM informing her that I was calling to do telephone visit and she can call back if she wants to do visit.

## 2020-09-18 ENCOUNTER — Other Ambulatory Visit: Payer: Self-pay

## 2020-09-18 MED FILL — Diltiazem HCl Coated Beads Cap ER 24HR 120 MG: ORAL | 30 days supply | Qty: 30 | Fill #0 | Status: AC

## 2020-09-18 MED FILL — Hydrochlorothiazide Cap 12.5 MG: ORAL | 30 days supply | Qty: 30 | Fill #0 | Status: AC

## 2020-10-02 ENCOUNTER — Ambulatory Visit: Payer: 59 | Admitting: Internal Medicine

## 2020-10-08 IMAGING — CR DG CHEST 2V
2 series · 2 of 2 positions shown · non-contrast
Comparison: 03/10/2018

CLINICAL DATA: Tachycardia. Palpitations. Chest pain. Shortness of
breath.

EXAM:
CHEST - 2 VIEW

[chest pa]
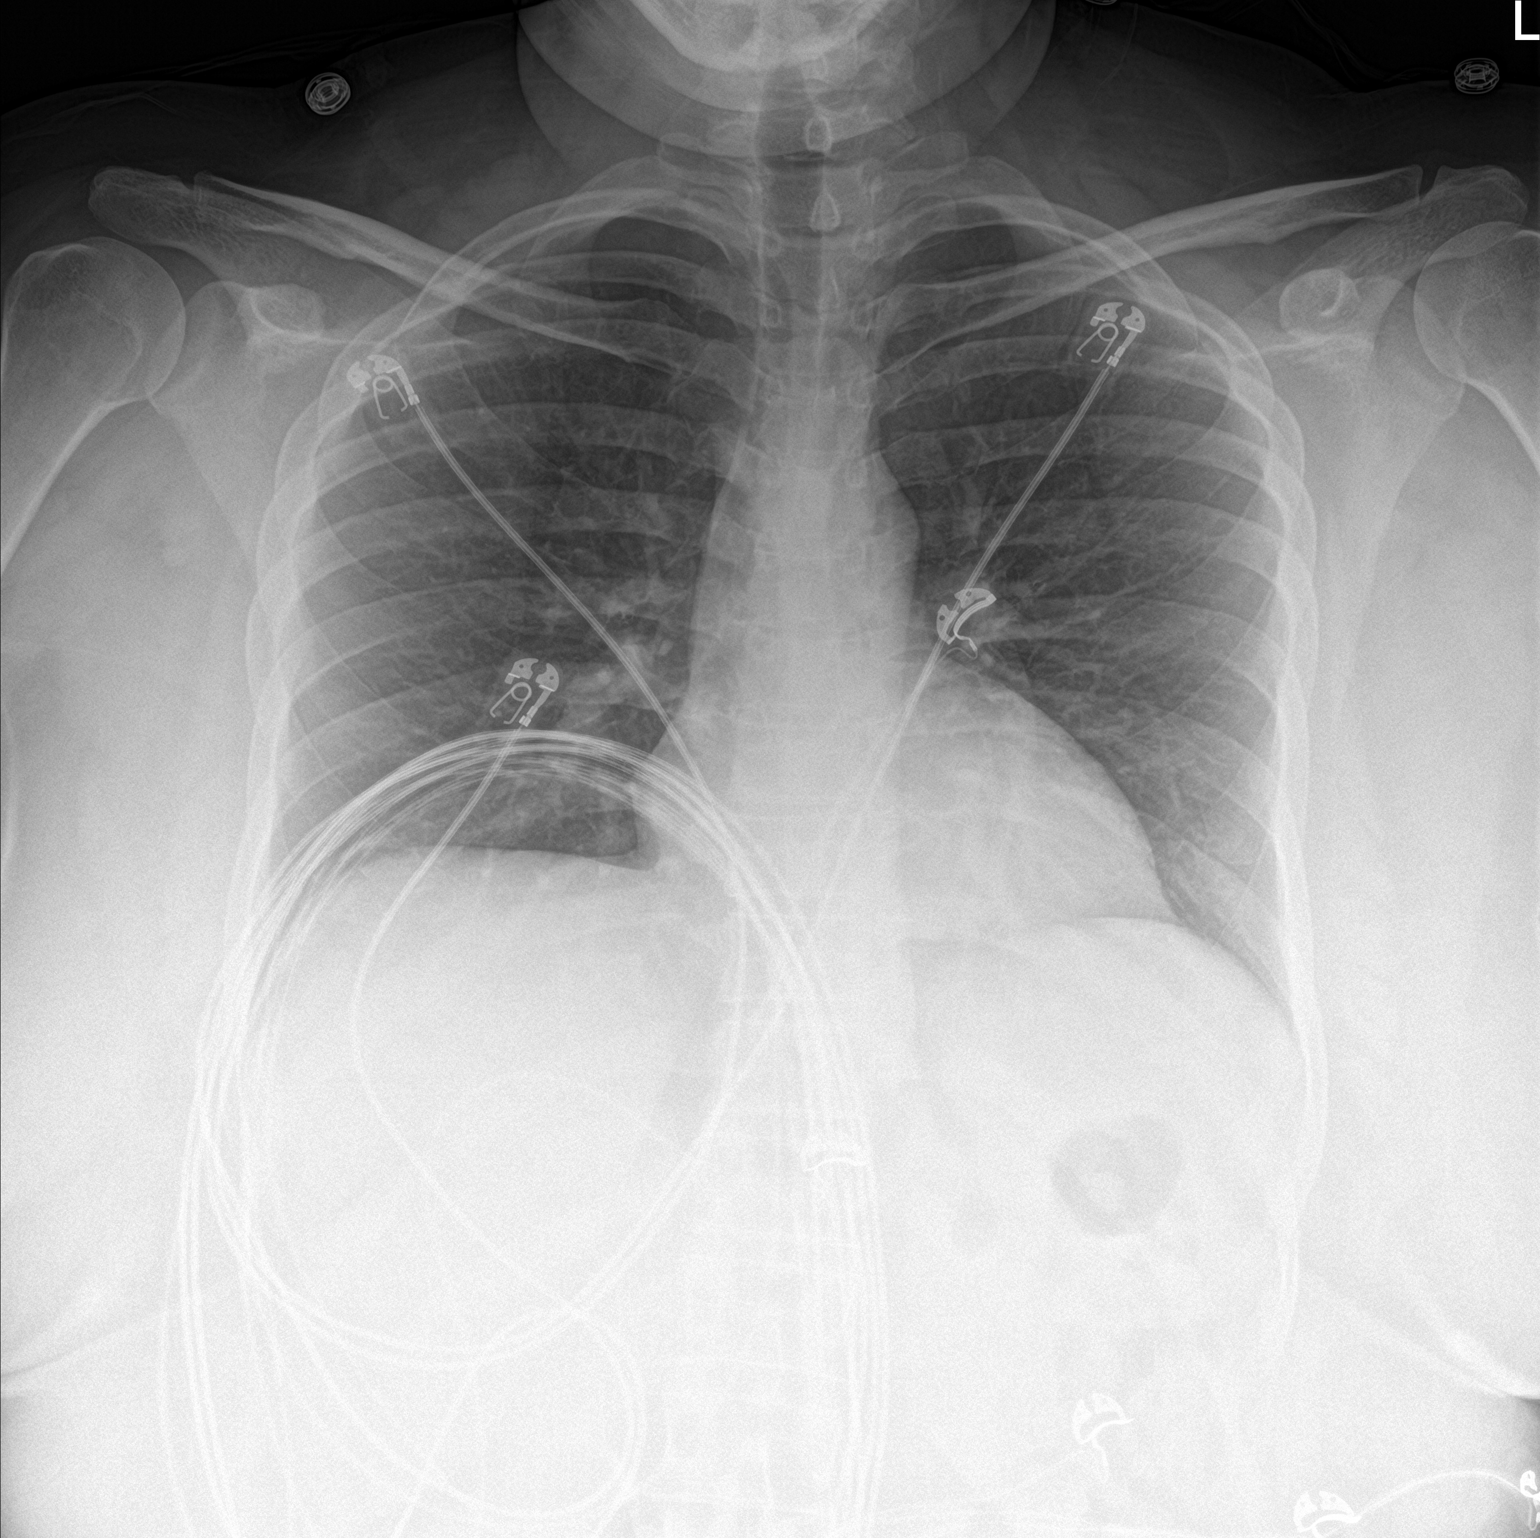

[chest lat]
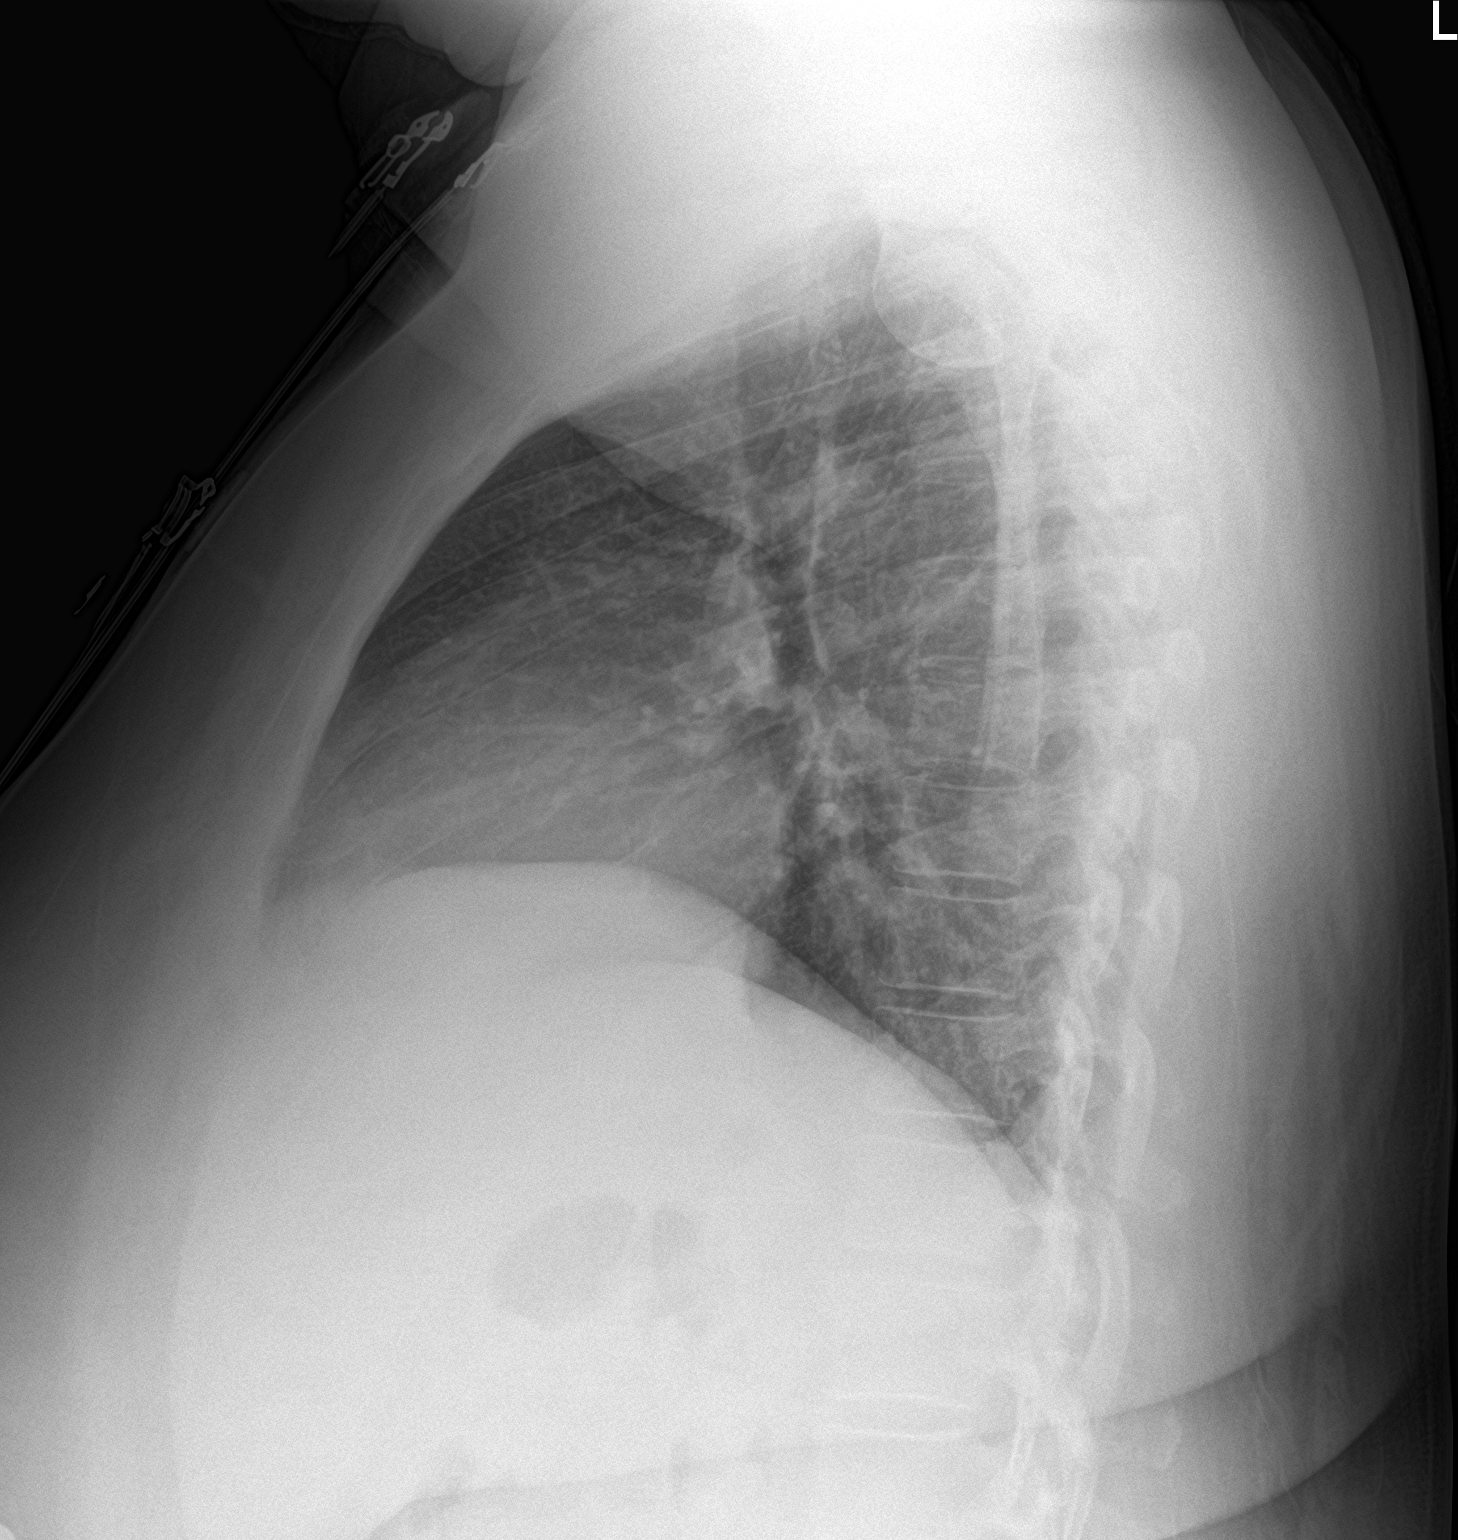

[2 of 2 positions shown; findings below may reference images not displayed]

FINDINGS: The heart size and mediastinal contours are within normal limits.
Both lungs are clear. The visualized skeletal structures are
unremarkable.
IMPRESSION: Negative.  No active cardiopulmonary disease.

## 2020-10-25 ENCOUNTER — Telehealth: Payer: Self-pay | Admitting: Internal Medicine

## 2020-10-25 DIAGNOSIS — I10 Essential (primary) hypertension: Secondary | ICD-10-CM

## 2020-10-25 DIAGNOSIS — I480 Paroxysmal atrial fibrillation: Secondary | ICD-10-CM

## 2020-10-25 MED ORDER — HYDROCHLOROTHIAZIDE 12.5 MG PO CAPS
ORAL_CAPSULE | Freq: Every day | ORAL | 1 refills | Status: DC
  Filled 2020-10-25: qty 30, 30d supply, fill #0
  Filled 2020-11-28: qty 30, 30d supply, fill #1

## 2020-10-25 MED ORDER — DILTIAZEM HCL ER COATED BEADS 120 MG PO CP24
ORAL_CAPSULE | Freq: Every day | ORAL | 1 refills | Status: DC
  Filled 2020-10-25: qty 30, 30d supply, fill #0
  Filled 2020-11-28: qty 30, 30d supply, fill #1

## 2020-10-25 NOTE — Telephone Encounter (Signed)
Rx sent to last until then. She must keep the Aug appointment.

## 2020-10-25 NOTE — Telephone Encounter (Signed)
Copied from CRM [id number]. Topic: General - Other >> Oct 24, 2020 11:04 AM Gaetana Michaelis A wrote: Reason for CRM: Patient would like to be contacted bys taff to further discuss prescription coordination   The patient was unable to make it to their last appointment with their PCP due to a prior engagement   Patient would like to know if it would be possible to receive a modified prescription until their next appointment on 12/24/20  Please contact to further advise when possible   Patient is needing HCTZ and diltiazem refilled. Can these medications be refilled until her appointment in August. Please advise.

## 2020-10-25 NOTE — Telephone Encounter (Signed)
Patient notified

## 2020-10-26 ENCOUNTER — Other Ambulatory Visit: Payer: Self-pay

## 2020-11-07 DIAGNOSIS — Z3042 Encounter for surveillance of injectable contraceptive: Secondary | ICD-10-CM | POA: Diagnosis not present

## 2020-11-07 DIAGNOSIS — R87610 Atypical squamous cells of undetermined significance on cytologic smear of cervix (ASC-US): Secondary | ICD-10-CM | POA: Diagnosis not present

## 2020-11-07 DIAGNOSIS — Z3200 Encounter for pregnancy test, result unknown: Secondary | ICD-10-CM | POA: Diagnosis not present

## 2020-11-07 DIAGNOSIS — Z113 Encounter for screening for infections with a predominantly sexual mode of transmission: Secondary | ICD-10-CM | POA: Diagnosis not present

## 2020-11-21 ENCOUNTER — Ambulatory Visit: Payer: Self-pay

## 2020-11-21 NOTE — Telephone Encounter (Signed)
Pt. Reports yesterday started having foul-smelling urine and frequency. Has mild pain with voiding. Requesting to come in and "give a specimen." Spoke with [place] in the practice and will send triage for review. States she cannot go to mobile unit today.

## 2020-11-21 NOTE — Telephone Encounter (Signed)
Answer Assessment - Initial Assessment Questions 1. SYMPTOM: "What's the main symptom you're concerned about?" (e.g., frequency, incontinence)     Strong urine 2. ONSET: "When did the  symptoms start?"     Yesterday 3. PAIN: "Is there any pain?" If Yes, ask: "How bad is it?" (Scale: 1-10; mild, moderate, severe)     Mild 4. CAUSE: "What do you think is causing the symptoms?"     Maybe a UTI 5. OTHER SYMPTOMS: "Do you have any other symptoms?" (e.g., fever, flank pain, blood in urine, pain with urination)     Frequency 6. PREGNANCY: "Is there any chance you are pregnant?" "When was your last menstrual period?"     No  Protocols used: Urinary Symptoms-A-AH

## 2020-11-21 NOTE — Telephone Encounter (Signed)
Patient has been scheduled for a 8:10 thursday

## 2020-11-21 NOTE — Telephone Encounter (Signed)
Please contact pt and schedule a virtual appt for 810 or 130 for Thursday

## 2020-11-21 NOTE — Telephone Encounter (Signed)
Deanna [name], would the patient be able to give a specimen with orders from Dr. Laural Benes or will she need a visit.

## 2020-11-22 ENCOUNTER — Encounter: Payer: Self-pay | Admitting: Internal Medicine

## 2020-11-22 ENCOUNTER — Other Ambulatory Visit: Payer: Self-pay

## 2020-11-22 ENCOUNTER — Ambulatory Visit: Payer: Self-pay | Attending: Internal Medicine | Admitting: Internal Medicine

## 2020-11-22 DIAGNOSIS — R8290 Unspecified abnormal findings in urine: Secondary | ICD-10-CM

## 2020-11-22 NOTE — Progress Notes (Signed)
Patient ID: Deanna [name], female   DOB: 1986-03-13, 35 y.o.   MRN: [id number] Virtual Visit via Telephone Note  I connected with Deanna [name] on 11/22/2020 at 8:16 a.m by telephone and verified that I am speaking with the correct person using two identifiers  Location: Patient: home Provider: office  Participants: Myself Patient CMA: Ms. Deanna [name] interpreter:   I discussed the limitations, risks, security and privacy concerns of performing an evaluation and management service by telephone and the availability of in person appointments. I also discussed with the patient that there may be a patient responsible charge related to this service. The patient expressed understanding and agreed to proceed.   History of Present Illness: Patient with history of HTN, morbid obesity, PAF.  This is an urgent care visit.  Patient is concerned that she may have a urinary tract infection.  Patient reports that 2 days ago her urine had strong odor like "that ammonia smell."  She had also noticed a little pain in her abdomen that day.  Symptoms resolved.  She denies any dysuria, hematuria, abnormal vaginal discharge, fever, nausea/vomiting.  Endorses frequent urination which is not new for her and attributes it to the hydrochlorothiazide and the fact that she drinks a lot of water during the day.  Over the past 24 hours, smell of urine has returned to what she would consider normal and no abdominal pain.  Outpatient Encounter Medications as of 11/22/2020  Medication Sig   diltiazem (CARDIZEM CD) 120 MG 24 hr capsule TAKE 1 CAPSULE (120 MG TOTAL) BY MOUTH DAILY.   hydrochlorothiazide (MICROZIDE) 12.5 MG capsule TAKE 1 TABLET (12.5 MG TOTAL) BY MOUTH DAILY.   medroxyPROGESTERone (DEPO-PROVERA) 150 MG/ML injection Inject 150 mg into the muscle every 3 (three) months.   melatonin 5 MG TABS TAKE 3 TABLETS (15 MG TOTAL) BY MOUTH AT BEDTIME.   NON FORMULARY Blood pressure medicine-does not know name  (Patient not taking: Reported on 04/10/2020)   No facility-administered encounter medications on file as of 11/22/2020.      Observations/Objective: No direct observation done as this was a telephone encounter.  Assessment and Plan: 1. Bad odor of urine Advised patient that based on her symptoms at this time it does not sound as though she has a urinary tract infection.  However if she develops symptoms again of foul odor, lower abdominal pain or dysuria, she can come in to submit a urine sample.  Patient is agreeable to this plan.   Follow Up Instructions: PRN   I discussed the assessment and treatment plan with the patient. The patient was provided an opportunity to ask questions and all were answered. The patient agreed with the plan and demonstrated an understanding of the instructions.   The patient was advised to call back or seek an in-person evaluation if the symptoms worsen or if the condition fails to improve as anticipated.  I  Spent 6 minutes on this telephone encounter  Jonah Blue, MD

## 2020-11-28 ENCOUNTER — Other Ambulatory Visit: Payer: Self-pay

## 2020-11-29 ENCOUNTER — Other Ambulatory Visit: Payer: Self-pay

## 2020-12-24 ENCOUNTER — Encounter: Payer: Self-pay | Admitting: Internal Medicine

## 2020-12-24 ENCOUNTER — Other Ambulatory Visit: Payer: Self-pay

## 2020-12-24 ENCOUNTER — Ambulatory Visit: Payer: BC Managed Care – PPO | Attending: Internal Medicine | Admitting: Internal Medicine

## 2020-12-24 DIAGNOSIS — I10 Essential (primary) hypertension: Secondary | ICD-10-CM

## 2020-12-24 MED ORDER — HYDROCHLOROTHIAZIDE 12.5 MG PO CAPS
ORAL_CAPSULE | Freq: Every day | ORAL | 2 refills | Status: DC
  Filled 2020-12-24 – 2021-01-01 (×2): qty 90, 90d supply, fill #0
  Filled 2021-04-09: qty 90, 90d supply, fill #1
  Filled 2021-07-17: qty 90, 90d supply, fill #0
  Filled 2021-07-17: qty 90, 90d supply, fill #2
  Filled 2021-07-29: qty 90, 90d supply, fill #0

## 2020-12-24 MED ORDER — DILTIAZEM HCL ER COATED BEADS 120 MG PO CP24
ORAL_CAPSULE | Freq: Every day | ORAL | 2 refills | Status: DC
  Filled 2020-12-24 – 2021-01-01 (×2): qty 90, 90d supply, fill #0
  Filled 2021-04-09: qty 90, 90d supply, fill #1
  Filled 2021-07-17: qty 90, 90d supply, fill #2
  Filled 2021-07-17 – 2021-07-29 (×2): qty 90, 90d supply, fill #0

## 2020-12-24 NOTE — Progress Notes (Signed)
Virtual Visit via Telephone Note  I connected with [employer] on 12/24/2020 at 3:56 PM by telephone and verified that I am speaking with the correct person using two identifiers  Location: Patient: home Provider: office  Participants: Myself Patient   I discussed the limitations, risks, security and privacy concerns of performing an evaluation and management service by telephone and the availability of in person appointments. I also discussed with the patient that there may be a patient responsible charge related to this service. The patient expressed understanding and agreed to proceed.   History of Present Illness: Patient with history of HTN, morbid obesity, PAF   HYPERTENSION Currently taking: see medication list.  SHe is on hydrochlorothiazide and diltiazem. Med Adherence: [x]  Yes    []  No Medication side effects: []  Yes    [x]  No Adherence with salt restriction: [x]  Yes    []  No Home Monitoring?: []  Yes    [x]  No.  However she reports that her blood pressure was checked about 2 months ago when she [facility] Depo shot and her reading was good. Monitoring Frequency:  Home BP results range:  SOB? []  Yes    [x]  No Chest Pain?: []  Yes    [x]  No Leg swelling?: []  Yes    [x]  No Headaches?: []  Yes    [x]  No Dizziness? []  Yes    [x]  No Comments: She has not had any palpitations.  She takes the diltiazem blood pressure and history of PAF  Obesity:  eating more grill meats, veggies, fruits and drinks mainly water.   Walking daily for exercise. Last wgh in system was 270 lbs.  Last wgh in when she went to [facility] for depo shot. Reports wgh was around 260 lbs  Outpatient Encounter Medications as of 12/24/2020  Medication Sig   diltiazem (CARDIZEM CD) 120 MG 24 hr capsule TAKE 1 CAPSULE (120 MG TOTAL) BY MOUTH DAILY.   hydrochlorothiazide (MICROZIDE) 12.5 MG capsule TAKE 1 TABLET (12.5 MG TOTAL) BY MOUTH DAILY.   medroxyPROGESTERone (DEPO-PROVERA) 150 MG/ML  injection Inject 150 mg into the muscle every 3 (three) months.   melatonin 5 MG TABS TAKE 3 TABLETS (15 MG TOTAL) BY MOUTH AT BEDTIME.   NON FORMULARY Blood pressure medicine-does not know name (Patient not taking: Reported on 04/10/2020)   No facility-administered encounter medications on file as of 12/24/2020.      Observations/Objective: No direct observation done as this was a telephone visit.  Assessment and Plan: 1. Essential hypertension Continue diltiazem and hydrochlorothiazide.  Refills given.  Patient agreeable to coming to the lab to have baseline blood test done. - diltiazem (CARDIZEM CD) 120 MG 24 hr capsule; TAKE 1 CAPSULE (120 MG TOTAL) BY MOUTH DAILY.  Dispense: 90 capsule; Refill: 2 - hydrochlorothiazide (MICROZIDE) 12.5 MG capsule; TAKE 1 TABLET (12.5 MG TOTAL) BY MOUTH DAILY.  Dispense: 90 capsule; Refill: 2 - CBC; Future - Comprehensive metabolic panel; Future - Lipid panel; Future  2. Morbid obesity (HCC) Commended her on changes that she has made in her eating habits and reported weight loss so far.  Dietary counseling given.  Encouraged her to continue her daily exercise routine.  Informed her about our medical weight management program.  Patient is interested in pursuing this to help her obtain her weight loss goal. - Amb Ref to Medical Weight Management - Hemoglobin A1c; Future   Follow Up Instructions: 4 months   I discussed the assessment and treatment plan  with the patient. The patient was provided an opportunity to ask questions and all were answered. The patient agreed with the plan and demonstrated an understanding of the instructions.   The patient was advised to call back or seek an in-person evaluation if the symptoms worsen or if the condition fails to improve as anticipated.  I  Spent 9 minutes on this telephone encounter  Jonah Blue, MD

## 2020-12-31 ENCOUNTER — Other Ambulatory Visit: Payer: Self-pay

## 2021-01-01 ENCOUNTER — Other Ambulatory Visit: Payer: Self-pay

## 2021-01-03 ENCOUNTER — Telehealth: Payer: Self-pay | Admitting: Internal Medicine

## 2021-01-03 NOTE — Telephone Encounter (Signed)
Copied from CRM [id number]. Topic: General - Call Back - No Documentation >> Jan 03, 2021  3:19 PM Randol Kern wrote: Best contact: [phone] Pt would like to speak to a nurse, she wants to discuss sleep apnea says she has been snoring a lot lately.

## 2021-01-04 ENCOUNTER — Other Ambulatory Visit: Payer: Self-pay

## 2021-01-04 NOTE — Telephone Encounter (Signed)
Patient has been schedule.

## 2021-01-07 ENCOUNTER — Other Ambulatory Visit: Payer: Self-pay

## 2021-01-07 ENCOUNTER — Ambulatory Visit: Payer: BC Managed Care – PPO | Attending: Internal Medicine | Admitting: Internal Medicine

## 2021-01-07 DIAGNOSIS — R0683 Snoring: Secondary | ICD-10-CM

## 2021-01-07 NOTE — Progress Notes (Signed)
Patient ID: Deanna [name], female   DOB: [birth year], 35 y.o.   MRN: [id number] Virtual Visit via Telephone Note  I connected with Cereniti N Graciano on 01/07/2021 at 11:01 AM by telephone and verified that I am speaking with the correct person using two identifiers  Location: Patient: work in a private location Provider: office  Participants: Myself Patient   I discussed the limitations, risks, security and privacy concerns of performing an evaluation and management service by telephone and the availability of in person appointments. I also discussed with the patient that there may be a patient responsible charge related to this service. The patient expressed understanding and agreed to proceed.   History of Present Illness: Patient with history of HTN, morbid obesity, PAF.  This is an urgent care visit.  Pt concern that she may have sleep apnea pt c/o loud snoring for several years.  She has been told by her mother and previous husband that she snores loud and stops breathing in her sleep.  She tries to get in about 8 hours of sleep at nights but wakes feeling unrefreshed at times.  She denies any morning headaches.  She endorses feeling tired during the day and falling asleep easily during the day.  No falling asleep while driving.  She tells me that previous PCP had ordered a sleep study prior to the COVID pandemic but she never heard anything from it. Outpatient Encounter Medications as of 01/07/2021  Medication Sig   diltiazem (CARDIZEM CD) 120 MG 24 hr capsule TAKE 1 CAPSULE (120 MG TOTAL) BY MOUTH DAILY.   hydrochlorothiazide (MICROZIDE) 12.5 MG capsule TAKE 1 TABLET (12.5 MG TOTAL) BY MOUTH DAILY.   medroxyPROGESTERone (DEPO-PROVERA) 150 MG/ML injection Inject 150 mg into the muscle every 3 (three) months.   melatonin 5 MG TABS TAKE 3 TABLETS (15 MG TOTAL) BY MOUTH AT BEDTIME.   NON FORMULARY Blood pressure medicine-does not know name (Patient not taking: Reported on 04/10/2020)   No  facility-administered encounter medications on file as of 01/07/2021.    Observations/Objective: No direct observation done as this was a telephone encounter.  Assessment and Plan: 1. Loud snoring -Symptoms certainly suggestive of sleep apnea.  I think she would qualify for an at home sleep study.  Order placed.  Advised patient that if she does not hear anything from [facility] sleep center in about 3 to 4 weeks, she should call me back to let me know. - Home sleep test   Follow Up Instructions: As previously scheduled.   I discussed the assessment and treatment plan with the patient. The patient was provided an opportunity to ask questions and all were answered. The patient agreed with the plan and demonstrated an understanding of the instructions.   The patient was advised to call back or seek an in-person evaluation if the symptoms worsen or if the condition fails to improve as anticipated.  I  Spent 8 minutes on this telephone encounter  Jonah Blue, MD

## 2021-01-10 ENCOUNTER — Telehealth: Payer: Self-pay | Admitting: Internal Medicine

## 2021-01-10 NOTE — Telephone Encounter (Signed)
Patient need 4 month f/u with Dr. Laural Benes in around Dec. Called and left vm for patient to call [phone] to schedule appt.

## 2021-01-10 NOTE — Telephone Encounter (Signed)
-----   Message from Marcine Matar, MD sent at 12/24/2020  4:10 PM EDT ----- F/u in 4 mths

## 2021-02-12 ENCOUNTER — Ambulatory Visit (HOSPITAL_COMMUNITY)
Admission: RE | Admit: 2021-02-12 | Discharge: 2021-02-12 | Disposition: A | Discharge status: Home / Self Care | Payer: Medicaid Other | Source: Ambulatory Visit | Attending: Student | Admitting: Student

## 2021-02-12 ENCOUNTER — Other Ambulatory Visit: Payer: Self-pay

## 2021-02-12 ENCOUNTER — Encounter (HOSPITAL_COMMUNITY): Payer: Self-pay

## 2021-02-12 VITALS — BP 161/110 | HR 90 | Temp 98.6°F | Resp 19

## 2021-02-12 DIAGNOSIS — L03011 Cellulitis of right finger: Secondary | ICD-10-CM

## 2021-02-12 DIAGNOSIS — I10 Essential (primary) hypertension: Secondary | ICD-10-CM

## 2021-02-12 HISTORY — DX: Obesity, unspecified: E66.9

## 2021-02-12 MED ORDER — DOXYCYCLINE HYCLATE 100 MG PO CAPS: 100.0000 mg | ORAL_CAPSULE | Freq: Two times a day (BID) | ORAL | 0 refills | Status: AC

## 2021-02-12 MED ORDER — KETOROLAC TROMETHAMINE 10 MG PO TABS: 10.0000 mg | ORAL_TABLET | Freq: Four times a day (QID) | ORAL | 0 refills | Status: DC | PRN

## 2021-02-12 NOTE — ED Provider Notes (Signed)
[facility]    CSN: [id number] Arrival date & time: 02/12/21  1731      History   Chief Complaint Chief Complaint  Patient presents with   (APPT 6PM)Nail infection    HPI Deanna [name] is a 35 y.o. female presenting with right finger infection.  Medical history noncontributory.  Patient states that she clipped a hangnail on the right finger index finger about 4 days ago, since then progressively worsening swelling and tenderness of the digit.  Decreased sensation.  Denies other trauma.  Denies discharge from the wound.  HPI  Past Medical History:  Diagnosis Date   Abnormal Pap smear    Atrial fibrillation (HCC)    Hypertension    Infection    UTI   Normal pregnancy 10/04/2011   Obese    SVD (spontaneous vaginal delivery) 10/04/2011   Trichomonas    Vaginal Pap smear, abnormal     Patient Active Problem List   Diagnosis Date Noted   Abnormal cervical Papanicolaou smear 08/09/2020   Irregular periods 08/09/2020   Vaginal discharge 08/09/2020   Psychophysiological insomnia 08/09/2020   Essential hypertension 07/26/2019   PAF (paroxysmal atrial fibrillation) (HCC) 07/26/2019   Exposure to syphilis 11/04/2017   Obesity 10/12/2013    Past Surgical History:  Procedure Laterality Date   LEEP     TOOTH EXTRACTION      OB History     Gravida  2   Para  2   Term  2   Preterm  0   AB  0   Living  2      SAB  0   IAB  0   Ectopic  0   Multiple  0   Live Births  2            Home Medications    Prior to Admission medications   Medication Sig Start Date End Date Taking? Authorizing Provider  doxycycline (VIBRAMYCIN) 100 MG capsule Take 1 capsule (100 mg total) by mouth 2 (two) times daily for 7 days. 02/12/21 02/19/21 Yes Rhys Martini, PA-C  ketorolac (TORADOL) 10 MG tablet Take 1 tablet (10 mg total) by mouth every 6 (six) hours as needed. 02/12/21  Yes Rhys Martini, PA-C  diltiazem (CARDIZEM CD) 120 MG 24 hr capsule TAKE 1  CAPSULE (120 MG TOTAL) BY MOUTH DAILY. 12/24/20 12/24/21  Marcine Matar, MD  hydrochlorothiazide (MICROZIDE) 12.5 MG capsule TAKE 1 TABLET (12.5 MG TOTAL) BY MOUTH DAILY. 12/24/20 12/24/21  Marcine Matar, MD  medroxyPROGESTERone (DEPO-PROVERA) 150 MG/ML injection Inject 150 mg into the muscle every 3 (three) months.    [provider]  melatonin 5 MG TABS TAKE 3 TABLETS (15 MG TOTAL) BY MOUTH AT BEDTIME. 08/09/20 08/09/21  Storm Frisk, MD  NON FORMULARY Blood pressure medicine-does not know name Patient not taking: No sig reported    [provider]    Family History Family History  Problem Relation Age of Onset   Diabetes Maternal Grandfather    Hypertension Mother    Diabetes Mother    Hypertension Father    Diabetes Sister    Hypertension Sister    Sarcoidosis Brother    Anesthesia problems Neg Hx    Hypotension Neg Hx    Malignant hyperthermia Neg Hx    Pseudochol deficiency Neg Hx     Social History Social History   Tobacco Use   Smoking status: Never   Smokeless tobacco: Never  Vaping Use  Vaping Use: Never used  Substance Use Topics   Alcohol use: Yes   Drug use: No     Allergies   Patient has no known allergies.   Review of Systems Review of Systems  Skin:  Positive for wound.  All other systems reviewed and are negative.   Physical Exam Triage Vital Signs ED Triage Vitals  Enc Vitals Group     BP 02/12/21 1810 (!) 161/110     Pulse Rate 02/12/21 1810 90     Resp 02/12/21 1810 19     Temp 02/12/21 1810 98.6 F (37 C)     Temp Source 02/12/21 1810 Oral     SpO2 02/12/21 1810 100 %     Weight --      Height --      Head Circumference --      Peak Flow --      Pain Score 02/12/21 1806 10     Pain Loc --      Pain Edu? --      Excl. in GC? --    No data found.  Updated Vital Signs BP (!) 161/110 (BP Location: Left Arm)   Pulse 90   Temp 98.6 F (37 C) (Oral)   Resp 19   SpO2 100%   Visual Acuity Right Eye  Distance:   Left Eye Distance:   Bilateral Distance:    Right Eye Near:   Left Eye Near:    Bilateral Near:     Physical Exam Vitals reviewed.  Constitutional:      General: She is not in acute distress.    Appearance: Normal appearance. She is not ill-appearing or diaphoretic.  HENT:     Head: Normocephalic and atraumatic.  Cardiovascular:     Rate and Rhythm: Normal rate and regular rhythm.     Heart sounds: Normal heart sounds.  Pulmonary:     Effort: Pulmonary effort is normal.     Breath sounds: Normal breath sounds.  Skin:    General: Skin is warm.     Comments: See images below R index finger with tenderness and induration medially, without fluctuance. Swelling does not extend to DIP joint. ROM DIP and PIP intact and without pain. Cap refill <2 seconds. No discharge expressed. No felon.  Neurological:     General: No focal deficit present.     Mental Status: She is alert and oriented to person, place, and time.  Psychiatric:        Mood and Affect: Mood normal.        Behavior: Behavior normal.        Thought Content: Thought content normal.        Judgment: Judgment normal.           UC Treatments / Results  Labs (all labs ordered are listed, but only abnormal results are displayed) Labs Reviewed - No data to display  EKG   Radiology No results found.  Procedures Procedures (including critical care time)  Medications Ordered in UC Medications - No data to display  Initial Impression / Assessment and Plan / UC Course  I have reviewed the triage vital signs and the nursing notes.  Pertinent labs & imaging results that were available during my care of the patient were reviewed by me and considered in my medical decision making (see chart for details).     This patient is a very pleasant 35 y.o. year old female presenting with R index finger paronychia. Afebrile, nontachy.  Immature paronychia, did not drain this today. Doxycycline, toradol. States  she is not pregnant or breastfeeding. ED return precautions discussed. Patient verbalizes understanding and agreement.    Final Clinical Impressions(s) / UC Diagnoses   Final diagnoses:  Paronychia of right index finger  Essential hypertension     Discharge Instructions      -Doxycycline twice daily for 7 days.  Make sure to wear sunscreen while spending time outside while on this medication as it can increase your chance of sunburn. You can take this medication with food if you have a sensitive stomach. -Toradol (Ketorolac), one pill up to every 6 hours for pain. Make sure to take this with food. Avoid other NSAIDs like ibuprofen, alleve, naproxen while taking this medication.  -You can also take Tylenol 1000mg  up to 3x daily -Please check your blood pressure at home or at the pharmacy. If this continues to be >140/90, follow-up with your primary care provider for further blood pressure management/ medication titration. If you develop chest pain, shortness of breath, vision changes, the worst headache of your life- head straight to the ED or call 911. -Soak in warm water to help  express pus -Ice can also help with pain -Follow-up if symptoms getting worse instead of better, like swelling, pain, new fever/chills, etc.   ED Prescriptions     Medication Sig Dispense Auth. Provider   doxycycline (VIBRAMYCIN) 100 MG capsule Take 1 capsule (100 mg total) by mouth 2 (two) times daily for 7 days. 14 capsule , PA-C   ketorolac (TORADOL) 10 MG tablet Take 1 tablet (10 mg total) by mouth every 6 (six) hours as needed. 20 tablet Rhys Martini, PA-C      PDMP not reviewed this encounter.   Rhys Martini, PA-C 02/12/21 1831

## 2021-02-12 NOTE — ED Triage Notes (Signed)
Pt is here with a right index finger that started as a hang nail on Thursday & has gotten worse. Pt has taken Advil, ice to relieve discomfort.

## 2021-02-12 NOTE — Discharge Instructions (Addendum)
-  Doxycycline twice daily for 7 days.  Make sure to wear sunscreen while spending time outside while on this medication as it can increase your chance of sunburn. You can take this medication with food if you have a sensitive stomach. -Toradol (Ketorolac), one pill up to every 6 hours for pain. Make sure to take this with food. Avoid other NSAIDs like ibuprofen, alleve, naproxen while taking this medication.  -You can also take Tylenol 1000mg  up to 3x daily -Please check your blood pressure at home or at the pharmacy. If this continues to be >140/90, follow-up with your primary care provider for further blood pressure management/ medication titration. If you develop chest pain, shortness of breath, vision changes, the worst headache of your life- head straight to the ED or call 911. -Soak in warm water to help  express pus -Ice can also help with pain -Follow-up if symptoms getting worse instead of better, like swelling, pain, new fever/chills, etc.

## 2021-02-13 MED FILL — Melatonin Tab 5 MG: ORAL | Qty: 100 | Fill #0 | Status: CN

## 2021-02-14 ENCOUNTER — Other Ambulatory Visit: Payer: Self-pay

## 2021-02-26 DIAGNOSIS — Z3200 Encounter for pregnancy test, result unknown: Secondary | ICD-10-CM | POA: Diagnosis not present

## 2021-02-26 DIAGNOSIS — Z3042 Encounter for surveillance of injectable contraceptive: Secondary | ICD-10-CM | POA: Diagnosis not present

## 2021-02-26 DIAGNOSIS — Z3009 Encounter for other general counseling and advice on contraception: Secondary | ICD-10-CM | POA: Diagnosis not present

## 2021-04-09 ENCOUNTER — Other Ambulatory Visit: Payer: Self-pay

## 2021-04-12 ENCOUNTER — Other Ambulatory Visit: Payer: Self-pay

## 2021-05-31 DIAGNOSIS — Z113 Encounter for screening for infections with a predominantly sexual mode of transmission: Secondary | ICD-10-CM | POA: Diagnosis not present

## 2021-05-31 DIAGNOSIS — N760 Acute vaginitis: Secondary | ICD-10-CM | POA: Diagnosis not present

## 2021-05-31 DIAGNOSIS — Z3042 Encounter for surveillance of injectable contraceptive: Secondary | ICD-10-CM | POA: Diagnosis not present

## 2021-07-10 ENCOUNTER — Other Ambulatory Visit: Payer: Self-pay

## 2021-07-10 ENCOUNTER — Encounter (HOSPITAL_COMMUNITY): Payer: Self-pay

## 2021-07-10 ENCOUNTER — Emergency Department (HOSPITAL_COMMUNITY): Payer: BC Managed Care – PPO

## 2021-07-10 ENCOUNTER — Emergency Department (HOSPITAL_COMMUNITY)
Admission: EM | Admit: 2021-07-10 | Discharge: 2021-07-11 | Disposition: A | Discharge status: Home / Self Care | Payer: BC Managed Care – PPO | Attending: Physician Assistant | Admitting: Physician Assistant

## 2021-07-10 DIAGNOSIS — R079 Chest pain, unspecified: Secondary | ICD-10-CM | POA: Insufficient documentation

## 2021-07-10 DIAGNOSIS — Z5321 Procedure and treatment not carried out due to patient leaving prior to being seen by health care provider: Secondary | ICD-10-CM | POA: Diagnosis not present

## 2021-07-10 DIAGNOSIS — R0789 Other chest pain: Secondary | ICD-10-CM | POA: Diagnosis not present

## 2021-07-10 LAB — CBC WITH DIFFERENTIAL/PLATELET
Abs Immature Granulocytes: 0.02 10*3/uL (ref 0.00–0.07)
Basophils Absolute: 0 10*3/uL (ref 0.0–0.1)
Basophils Relative: 1 %
Eosinophils Absolute: 0.1 10*3/uL (ref 0.0–0.5)
Eosinophils Relative: 2 %
HCT: 46.3 % — ABNORMAL HIGH (ref 36.0–46.0)
Hemoglobin: 15.4 g/dL — ABNORMAL HIGH (ref 12.0–15.0)
Immature Granulocytes: 0 %
Lymphocytes Relative: 40 %
Lymphs Abs: 2.5 10*3/uL (ref 0.7–4.0)
MCH: 30.1 pg (ref 26.0–34.0)
MCHC: 33.3 g/dL (ref 30.0–36.0)
MCV: 90.4 fL (ref 80.0–100.0)
Monocytes Absolute: 0.8 10*3/uL (ref 0.1–1.0)
Monocytes Relative: 13 %
Neutro Abs: 2.8 10*3/uL (ref 1.7–7.7)
Neutrophils Relative %: 44 %
Platelets: 270 10*3/uL (ref 150–400)
RBC: 5.12 MIL/uL — ABNORMAL HIGH (ref 3.87–5.11)
RDW: 14.6 % (ref 11.5–15.5)
WBC: 6.3 10*3/uL (ref 4.0–10.5)
nRBC: 0 % (ref 0.0–0.2)

## 2021-07-10 LAB — I-STAT BETA HCG BLOOD, ED (MC, WL, AP ONLY): I-stat hCG, quantitative: <5 m[IU]/mL (ref <5)

## 2021-07-10 LAB — TROPONIN I (HIGH SENSITIVITY): Troponin I (High Sensitivity): 4 ng/L (ref <18)

## 2021-07-10 LAB — BASIC METABOLIC PANEL
Anion gap: 11 (ref 5–15)
BUN: 6 mg/dL (ref 6–20)
CO2: 23 mmol/L (ref 22–32)
Calcium: 8.9 mg/dL (ref 8.9–10.3)
Chloride: 102 mmol/L (ref 98–111)
Creatinine, Ser: 0.87 mg/dL (ref 0.44–1.00)
GFR, Estimated: >60 mL/min (ref >60)
Glucose, Bld: 120 mg/dL — ABNORMAL HIGH (ref 70–99)
Potassium: 3 mmol/L — ABNORMAL LOW (ref 3.5–5.1)
Sodium: 136 mmol/L (ref 135–145)

## 2021-07-10 NOTE — ED Triage Notes (Signed)
Pt arrived POV c/o intermittent CP since this morning. Pt states the pain is all over.

## 2021-07-10 NOTE — ED Provider Triage Note (Signed)
Emergency Medicine Provider Triage Evaluation Note  Deanna [name] , a 36 y.o. female  was evaluated in triage.  Pt complains of central chest pain.  Does not radiate to back.  Pain is constant.  Not pleuritic in nature.  No history of PE or DVT.  No recent surgery, immobilization or malignancy.  Does have history of A-fib not on anticoagulation.  No fever, cough Review of Systems  Positive: CP Negative: LE swelling, back pain, abd pain  Physical Exam  BP (!) 150/108 (BP Location: Right Arm)    Pulse (!) 114    Temp 98.7 F (37.1 C) (Oral)    Resp 18    SpO2 100%  Gen:   Awake, no distress   Resp:  Normal effort  MSK:   Moves extremities without difficulty  Other:    Medical Decision Making  Medically screening exam initiated at 2:16 PM.  Appropriate orders placed.  Ardis N Facemire was informed that the remainder of the evaluation will be completed by another provider, this initial triage assessment does not replace that evaluation, and the importance of remaining in the ED until their evaluation is complete.  cp   Ezeriah Luty A, PA-C 07/10/21 1417

## 2021-07-10 NOTE — ED Notes (Signed)
Pt still does not respond for vitals check or repeat labs x4

## 2021-07-11 LAB — I-STAT BETA HCG BLOOD, ED (MC, WL, AP ONLY): I-stat hCG, quantitative: <5 m[IU]/mL (ref <5)

## 2021-07-11 NOTE — ED Notes (Signed)
Called for vitals x5 °

## 2021-07-16 ENCOUNTER — Other Ambulatory Visit: Payer: Self-pay

## 2021-07-16 ENCOUNTER — Encounter (HOSPITAL_BASED_OUTPATIENT_CLINIC_OR_DEPARTMENT_OTHER): Payer: Self-pay

## 2021-07-16 ENCOUNTER — Emergency Department (HOSPITAL_BASED_OUTPATIENT_CLINIC_OR_DEPARTMENT_OTHER)
Admission: EM | Admit: 2021-07-16 | Discharge: 2021-07-16 | Disposition: A | Discharge status: Home / Self Care | Payer: BC Managed Care – PPO | Attending: Emergency Medicine | Admitting: Emergency Medicine

## 2021-07-16 DIAGNOSIS — Z5321 Procedure and treatment not carried out due to patient leaving prior to being seen by health care provider: Secondary | ICD-10-CM | POA: Insufficient documentation

## 2021-07-16 DIAGNOSIS — R079 Chest pain, unspecified: Secondary | ICD-10-CM | POA: Diagnosis not present

## 2021-07-16 LAB — CBC
HCT: 45.4 % (ref 36.0–46.0)
Hemoglobin: 15 g/dL (ref 12.0–15.0)
MCH: 29.6 pg (ref 26.0–34.0)
MCHC: 33 g/dL (ref 30.0–36.0)
MCV: 89.7 fL (ref 80.0–100.0)
Platelets: 248 10*3/uL (ref 150–400)
RBC: 5.06 MIL/uL (ref 3.87–5.11)
RDW: 14.8 % (ref 11.5–15.5)
WBC: 6 10*3/uL (ref 4.0–10.5)
nRBC: 0 % (ref 0.0–0.2)

## 2021-07-16 LAB — BASIC METABOLIC PANEL
Anion gap: 12 (ref 5–15)
BUN: 11 mg/dL (ref 6–20)
CO2: 25 mmol/L (ref 22–32)
Calcium: 9.4 mg/dL (ref 8.9–10.3)
Chloride: 100 mmol/L (ref 98–111)
Creatinine, Ser: 0.65 mg/dL (ref 0.44–1.00)
GFR, Estimated: >60 mL/min (ref >60)
Glucose, Bld: 111 mg/dL — ABNORMAL HIGH (ref 70–99)
Potassium: 3 mmol/L — ABNORMAL LOW (ref 3.5–5.1)
Sodium: 137 mmol/L (ref 135–145)

## 2021-07-16 LAB — TROPONIN I (HIGH SENSITIVITY): Troponin I (High Sensitivity): 2 ng/L (ref <18)

## 2021-07-16 NOTE — ED Triage Notes (Signed)
Patient here POV from Home with CP.  Patient has been having CP for approximately 1 week. States Pain is Right Chest and Non-Radiating. [facility] [place].  Seen at another ED but left AMA due to wait.  No Fevers. No N/V/D.   NAD Noted during Triage. A&Ox4. GCS 15. Ambulatory.

## 2021-07-17 ENCOUNTER — Other Ambulatory Visit: Payer: Self-pay

## 2021-07-23 ENCOUNTER — Other Ambulatory Visit: Payer: Self-pay

## 2021-07-29 ENCOUNTER — Other Ambulatory Visit: Payer: Self-pay

## 2021-07-30 ENCOUNTER — Other Ambulatory Visit: Payer: Self-pay

## 2021-09-04 ENCOUNTER — Encounter (HOSPITAL_COMMUNITY): Payer: Self-pay

## 2021-09-04 ENCOUNTER — Other Ambulatory Visit: Payer: Self-pay

## 2021-09-04 ENCOUNTER — Ambulatory Visit (HOSPITAL_COMMUNITY)
Admission: RE | Admit: 2021-09-04 | Discharge: 2021-09-04 | Disposition: A | Discharge status: Home / Self Care | Payer: BC Managed Care – PPO | Source: Ambulatory Visit | Attending: Family Medicine | Admitting: Family Medicine

## 2021-09-04 VITALS — BP 137/94 | HR 110 | Temp 99.2°F | Resp 17 | Ht 66.0 in | Wt 259.9 lb

## 2021-09-04 DIAGNOSIS — N760 Acute vaginitis: Secondary | ICD-10-CM

## 2021-09-04 LAB — POCT URINALYSIS DIPSTICK, ED / UC
Glucose, UA: NEGATIVE mg/dL
Hgb urine dipstick: NEGATIVE
Ketones, ur: 15 mg/dL — AB
Leukocytes,Ua: NEGATIVE
Nitrite: NEGATIVE
Protein, ur: 100 mg/dL — AB
Specific Gravity, Urine: 1.015 (ref 1.005–1.030)
Urobilinogen, UA: >=8 mg/dL (ref 0.0–1.0)
pH: 7 (ref 5.0–8.0)

## 2021-09-04 LAB — POC URINE PREG, ED: Preg Test, Ur: NEGATIVE

## 2021-09-04 MED ORDER — METRONIDAZOLE 500 MG PO TABS: 500.0000 mg | ORAL_TABLET | Freq: Two times a day (BID) | ORAL | 0 refills | Status: AC

## 2021-09-04 NOTE — ED Provider Notes (Addendum)
?[facility] ? ? ? ?CSN: [id number] ?Arrival date & time: 09/04/21  1735 ? ? ?  ? ?History   ?Chief Complaint ?Chief Complaint  ?Patient presents with  ? SEXUALLY TRANSMITTED DISEASE  ?  Need too be checked for that - Entered by patient  ? Abdominal Pain  ? ? ?HPI ?Deanna [name] is a 36 y.o. female.  ? ? ?Abdominal Pain ?Here for perineal irritation and lower abd pain. No dysuria or fever. LMP?-on depo provera. ? ?Feels like when she had BV ? ?Past Medical History:  ?Diagnosis Date  ? Abnormal Pap smear   ? Atrial fibrillation (HCC)   ? Hypertension   ? Infection   ? UTI  ? Normal pregnancy 10/04/2011  ? Obese   ? SVD (spontaneous vaginal delivery) 10/04/2011  ? Trichomonas   ? Vaginal Pap smear, abnormal   ? ? ?Patient Active Problem List  ? Diagnosis Date Noted  ? Abnormal cervical Papanicolaou smear 08/09/2020  ? Irregular periods 08/09/2020  ? Vaginal discharge 08/09/2020  ? Psychophysiological insomnia 08/09/2020  ? Essential hypertension 07/26/2019  ? PAF (paroxysmal atrial fibrillation) (HCC) 07/26/2019  ? Exposure to syphilis 11/04/2017  ? Obesity 10/12/2013  ? ? ?Past Surgical History:  ?Procedure Laterality Date  ? LEEP    ? TOOTH EXTRACTION    ? ? ?OB History   ? ? Gravida  ?2  ? Para  ?2  ? Term  ?2  ? Preterm  ?0  ? AB  ?0  ? Living  ?2  ?  ? ? SAB  ?0  ? IAB  ?0  ? Ectopic  ?0  ? Multiple  ?0  ? Live Births  ?2  ?   ?  ?  ? ? ? ?Home Medications   ? ?Prior to Admission medications   ?Medication Sig Start Date End Date Taking? Authorizing Provider  ?metroNIDAZOLE (FLAGYL) 500 MG tablet Take 1 tablet (500 mg total) by mouth 2 (two) times daily for 7 days. 09/04/21 09/11/21 Yes Lacharles Altschuler, Janace Aris, MD  ?diltiazem (CARDIZEM CD) 120 MG 24 hr capsule TAKE 1 CAPSULE (120 MG TOTAL) BY MOUTH DAILY. 12/24/20 12/24/21  Marcine Matar, MD  ?hydrochlorothiazide (MICROZIDE) 12.5 MG capsule TAKE 1 TABLET (12.5 MG TOTAL) BY MOUTH DAILY. 12/24/20 12/24/21  Marcine Matar, MD  ?ketorolac (TORADOL) 10 MG tablet  Take 1 tablet (10 mg total) by mouth every 6 (six) hours as needed. 02/12/21   Rhys Martini, PA-C  ?medroxyPROGESTERone (DEPO-PROVERA) 150 MG/ML injection Inject 150 mg into the muscle every 3 (three) months.    [provider]  ? ? ?Family History ?Family History  ?Problem Relation Age of Onset  ? Diabetes Maternal Grandfather   ? Hypertension Mother   ? Diabetes Mother   ? Hypertension Father   ? Diabetes Sister   ? Hypertension Sister   ? Sarcoidosis Brother   ? Anesthesia problems Neg Hx   ? Hypotension Neg Hx   ? Malignant hyperthermia Neg Hx   ? Pseudochol deficiency Neg Hx   ? ? ?Social History ?Social History  ? ?Tobacco Use  ? Smoking status: Never  ? Smokeless tobacco: Never  ?Vaping Use  ? Vaping Use: Never used  ?Substance Use Topics  ? Alcohol use: Yes  ? Drug use: No  ? ? ? ?Allergies   ?Patient has no known allergies. ? ? ?Review of Systems ?Review of Systems  ?Gastrointestinal:  Positive for abdominal pain.  ? ? ?Physical  Exam ?Triage Vital Signs ?ED Triage Vitals  ?Enc Vitals Group  ?   BP 09/04/21 1747 (!) 137/94  ?   Pulse Rate 09/04/21 1747 (!) 110  ?   Resp 09/04/21 1747 17  ?   Temp 09/04/21 1747 99.2 ?F (37.3 ?C)  ?   Temp Source 09/04/21 1747 Oral  ?   SpO2 09/04/21 1747 97 %  ?   Weight 09/04/21 1746 259 lb 14.8 oz (117.9 kg)  ?   Height 09/04/21 1746 5\' 6"  (1.676 m)  ?   Head Circumference --   ?   Peak Flow --   ?   Pain Score 09/04/21 1745 5  ?   Pain Loc --   ?   Pain Edu? --   ?   Excl. in GC? --   ? ?No data found. ? ?Updated Vital Signs ?BP (!) 137/94 (BP Location: Right Arm)   Pulse (!) 110   Temp 99.2 ?F (37.3 ?C) (Oral)   Resp 17   Ht 5\' 6"  (1.676 m)   Wt 117.9 kg   SpO2 97%   BMI 41.95 kg/m?  ? ?Visual Acuity ?Right Eye Distance:   ?Left Eye Distance:   ?Bilateral Distance:   ? ?Right Eye Near:   ?Left Eye Near:    ?Bilateral Near:    ? ?Physical Exam ?Vitals reviewed.  ?Constitutional:   ?   General: She is not in acute distress. ?   Appearance: She is not  ill-appearing, toxic-appearing or diaphoretic.  ?Cardiovascular:  ?   Rate and Rhythm: Normal rate and regular rhythm.  ?Abdominal:  ?   Palpations: Abdomen is soft.  ?   Tenderness: There is no abdominal tenderness.  ?Skin: ?   Coloration: Skin is not jaundiced or pale.  ?Psychiatric:     ?   Behavior: Behavior normal.  ? ? ? ?UC Treatments / Results  ?Labs ?(all labs ordered are listed, but only abnormal results are displayed) ?Labs Reviewed  ?POCT URINALYSIS DIPSTICK, ED / UC - Abnormal; Notable for the following components:  ?    Result Value  ? Bilirubin Urine MODERATE (*)   ? Ketones, ur 15 (*)   ? Protein, ur 100 (*)   ? All other components within normal limits  ?POC URINE PREG, ED  ? ? ?EKG ? ? ?Radiology ?No results found. ? ?Procedures ?Procedures (including critical care time) ? ?Medications Ordered in UC ?Medications - No data to display ? ?Initial Impression / Assessment and Plan / UC Course  ?I have reviewed the triage vital signs and the nursing notes. ? ?Pertinent labs & imaging results that were available during my care of the patient were reviewed by me and considered in my medical decision making (see chart for details). ? ?  ? ?UPT is negative. UA is neg for leuks/nitrites and blood. There is 100 mg% of protein and some bili. ?She wants to be treated empirically with the flagyl. Staff will notify her of any other positives. ?Final Clinical Impressions(s) / UC Diagnoses  ? ?Final diagnoses:  ?Vaginitis and vulvovaginitis  ? ? ? ?Discharge Instructions   ? ?  ?Urinalysis is negative. ? ?Pregnancy test is negative. ? ?Take metronidazole 500 mg -- 1 tab twice daily for 7 days. Do not drink alcohol within 72 hours of taking this medication. ? ? ?Staff will call you with any positives on the swab. ? ? ? ? ?ED Prescriptions   ? ? Medication Sig Dispense Auth.  Provider  ? metroNIDAZOLE (FLAGYL) 500 MG tablet Take 1 tablet (500 mg total) by mouth 2 (two) times daily for 7 days. 14 tablet Truxton Stupka, Janace ArisPamela  K, MD  ? ?  ? ?PDMP not reviewed this encounter. ?  ?Zenia ResidesBanister, Trystan Eads K, MD ?09/04/21 1827 ? ?  ?Zenia ResidesBanister, Y[address] K, MD ?09/04/21 1832 ? ?

## 2021-09-04 NOTE — Discharge Instructions (Addendum)
Urinalysis is negative. ? ?Pregnancy test is negative. ? ?Take metronidazole 500 mg -- 1 tab twice daily for 7 days. Do not drink alcohol within 72 hours of taking this medication. ? ? ?Staff will call you with any positives on the swab. ?

## 2021-09-04 NOTE — ED Triage Notes (Signed)
Pt reports upper and lower abdominal pain x 1 week. States these symptoms were similar to dx of BV. Requesting STD testing. Denies itching, odor or discharge.  ?

## 2021-09-05 LAB — CERVICOVAGINAL ANCILLARY ONLY
Bacterial Vaginitis (gardnerella): POSITIVE — AB
Candida Glabrata: NEGATIVE
Candida Vaginitis: NEGATIVE
Chlamydia: NEGATIVE
Comment: NEGATIVE
Comment: NEGATIVE
Comment: NEGATIVE
Comment: NEGATIVE
Comment: NEGATIVE
Comment: NORMAL
Neisseria Gonorrhea: NEGATIVE
Trichomonas: NEGATIVE

## 2021-09-11 DIAGNOSIS — Z3200 Encounter for pregnancy test, result unknown: Secondary | ICD-10-CM | POA: Diagnosis not present

## 2021-09-11 DIAGNOSIS — Z3042 Encounter for surveillance of injectable contraceptive: Secondary | ICD-10-CM | POA: Diagnosis not present

## 2021-12-11 DIAGNOSIS — Z3042 Encounter for surveillance of injectable contraceptive: Secondary | ICD-10-CM | POA: Diagnosis not present

## 2021-12-11 DIAGNOSIS — Z3200 Encounter for pregnancy test, result unknown: Secondary | ICD-10-CM | POA: Diagnosis not present

## 2021-12-11 DIAGNOSIS — Z3009 Encounter for other general counseling and advice on contraception: Secondary | ICD-10-CM | POA: Diagnosis not present

## 2021-12-24 ENCOUNTER — Other Ambulatory Visit: Payer: Self-pay | Admitting: Internal Medicine

## 2021-12-24 ENCOUNTER — Other Ambulatory Visit: Payer: Self-pay

## 2021-12-24 DIAGNOSIS — I10 Essential (primary) hypertension: Secondary | ICD-10-CM

## 2021-12-25 ENCOUNTER — Other Ambulatory Visit: Payer: Self-pay | Admitting: Internal Medicine

## 2021-12-25 DIAGNOSIS — I10 Essential (primary) hypertension: Secondary | ICD-10-CM

## 2021-12-26 ENCOUNTER — Encounter (HOSPITAL_COMMUNITY): Payer: Self-pay

## 2021-12-26 ENCOUNTER — Ambulatory Visit (HOSPITAL_COMMUNITY)
Admission: RE | Admit: 2021-12-26 | Discharge: 2021-12-26 | Disposition: A | Discharge status: Home / Self Care | Payer: BC Managed Care – PPO | Source: Ambulatory Visit | Attending: Family Medicine | Admitting: Family Medicine

## 2021-12-26 ENCOUNTER — Other Ambulatory Visit: Payer: Self-pay

## 2021-12-26 ENCOUNTER — Ambulatory Visit (INDEPENDENT_AMBULATORY_CARE_PROVIDER_SITE_OTHER): Payer: BC Managed Care – PPO

## 2021-12-26 VITALS — BP 146/102 | HR 100 | Temp 98.1°F | Resp 17

## 2021-12-26 DIAGNOSIS — I10 Essential (primary) hypertension: Secondary | ICD-10-CM

## 2021-12-26 DIAGNOSIS — R0782 Intercostal pain: Secondary | ICD-10-CM | POA: Diagnosis not present

## 2021-12-26 DIAGNOSIS — R0781 Pleurodynia: Secondary | ICD-10-CM | POA: Diagnosis not present

## 2021-12-26 MED ORDER — DICLOFENAC SODIUM 75 MG PO TBEC: 75.0000 mg | DELAYED_RELEASE_TABLET | Freq: Two times a day (BID) | ORAL | 0 refills | Status: DC
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
## 2021-12-26 NOTE — Discharge Instructions (Addendum)
Your blood pressure was noted to be elevated during your visit today. If you are currently taking medication for high blood pressure, please ensure you are taking this as directed. If you do not have a history of high blood pressure and your blood pressure remains persistently elevated, you may need to begin taking a medication at some point. You may return here within the next few days to recheck if unable to see your primary care provider or if you do not have a one.  BP (!) 146/102 (BP Location: Right Arm)   Pulse 100   Temp 98.1 F (36.7 C) (Oral)   Resp 17   SpO2 97%   BP Readings from Last 3 Encounters:  12/26/21 (!) 146/102  09/04/21 (!) 137/94  07/16/21 (!) 140/94
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
## 2022-01-03 ENCOUNTER — Other Ambulatory Visit: Payer: Self-pay | Admitting: Internal Medicine

## 2022-01-03 ENCOUNTER — Other Ambulatory Visit: Payer: Self-pay

## 2022-01-03 DIAGNOSIS — I10 Essential (primary) hypertension: Secondary | ICD-10-CM

## 2022-01-03 MED ORDER — DILTIAZEM HCL ER COATED BEADS 120 MG PO CP24
ORAL_CAPSULE | Freq: Every day | ORAL | 1 refills | Status: DC
  Filled 2022-01-03 – 2022-01-10 (×2): qty 30, 30d supply, fill #0
  Filled 2022-03-26: qty 30, 30d supply, fill #1

## 2022-01-03 MED ORDER — HYDROCHLOROTHIAZIDE 12.5 MG PO CAPS
ORAL_CAPSULE | Freq: Every day | ORAL | 1 refills | Status: DC
  Filled 2022-01-03 – 2022-01-10 (×2): qty 30, 30d supply, fill #0
  Filled 2022-03-26: qty 30, 30d supply, fill #1

## 2022-01-09 ENCOUNTER — Other Ambulatory Visit: Payer: Self-pay

## 2022-01-10 ENCOUNTER — Other Ambulatory Visit: Payer: Self-pay

## 2022-03-11 DIAGNOSIS — Z3042 Encounter for surveillance of injectable contraceptive: Secondary | ICD-10-CM | POA: Diagnosis not present

## 2022-03-24 ENCOUNTER — Telehealth: Payer: BC Managed Care – PPO | Admitting: Internal Medicine

## 2022-03-26 ENCOUNTER — Other Ambulatory Visit: Payer: Self-pay

## 2022-05-05 ENCOUNTER — Other Ambulatory Visit: Payer: Self-pay | Admitting: Internal Medicine

## 2022-05-05 ENCOUNTER — Telehealth: Payer: Self-pay | Admitting: Internal Medicine

## 2022-05-05 DIAGNOSIS — I10 Essential (primary) hypertension: Secondary | ICD-10-CM

## 2022-05-05 MED ORDER — HYDROCHLOROTHIAZIDE 12.5 MG PO CAPS
12.5000 mg | ORAL_CAPSULE | Freq: Every day | ORAL | 0 refills | Status: DC
  Filled 2022-05-05: qty 18, 18d supply, fill #0

## 2022-05-05 MED ORDER — DILTIAZEM HCL ER COATED BEADS 120 MG PO CP24
120.0000 mg | ORAL_CAPSULE | Freq: Every day | ORAL | 0 refills | Status: DC
  Filled 2022-05-05: qty 18, 18d supply, fill #0

## 2022-05-06 ENCOUNTER — Other Ambulatory Visit: Payer: Self-pay

## 2022-05-06 NOTE — Telephone Encounter (Signed)
Noted patient called and appointment changed to in person

## 2022-05-09 ENCOUNTER — Other Ambulatory Visit: Payer: Self-pay

## 2022-05-23 ENCOUNTER — Other Ambulatory Visit: Payer: Self-pay

## 2022-05-23 ENCOUNTER — Ambulatory Visit: Payer: BC Managed Care – PPO | Attending: Internal Medicine | Admitting: Internal Medicine

## 2022-05-23 ENCOUNTER — Encounter: Payer: Self-pay | Admitting: Internal Medicine

## 2022-05-23 VITALS — BP 141/101 | HR 96 | Temp 98.4°F | Ht 66.0 in | Wt 239.0 lb

## 2022-05-23 DIAGNOSIS — Z6838 Body mass index (BMI) 38.0-38.9, adult: Secondary | ICD-10-CM

## 2022-05-23 DIAGNOSIS — I10 Essential (primary) hypertension: Secondary | ICD-10-CM | POA: Diagnosis not present

## 2022-05-23 DIAGNOSIS — I480 Paroxysmal atrial fibrillation: Secondary | ICD-10-CM | POA: Diagnosis not present

## 2022-05-23 MED ORDER — HYDROCHLOROTHIAZIDE 12.5 MG PO CAPS
12.5000 mg | ORAL_CAPSULE | Freq: Every day | ORAL | 1 refills | Status: DC
  Filled 2022-05-23 – 2022-06-10 (×3): qty 90, 90d supply, fill #0
  Filled 2022-10-30: qty 30, 30d supply, fill #1
  Filled 2022-12-23: qty 30, 30d supply, fill #2
  Filled 2023-02-13 – 2023-03-04 (×2): qty 30, 30d supply, fill #3

## 2022-05-23 MED ORDER — DILTIAZEM HCL ER COATED BEADS 180 MG PO CP24
180.0000 mg | ORAL_CAPSULE | Freq: Every day | ORAL | 1 refills | Status: DC
  Filled 2022-05-23 – 2022-06-10 (×3): qty 90, 90d supply, fill #0
  Filled 2022-10-30: qty 30, 30d supply, fill #1
  Filled 2022-12-23: qty 60, 60d supply, fill #2

## 2022-05-23 NOTE — Patient Instructions (Signed)
Increase diltiazem to 180 mg daily. Blood pressure goal is 130/80 or lower.  We will have you follow-up with our clinical pharmacist in 3 weeks for repeat blood pressure check.

## 2022-05-23 NOTE — Progress Notes (Signed)
Patient ID: Deanna [name], female    DOB: 06-Jul-1985  MRN: [id number]  CC: Hypertension (HTN f/u. Dallie Piles received flu vax at work this season. )   Subjective: Deanna [name] is a 36 y.o. female who presents for chronic ds management Her concerns today include:  Patient with history of HTN, morbid obesity, PAF.   HM: had flu shot at work in Sept.  HYPERTENSION/PAF Currently taking: see medication list.  She is on diltiazem extended release 120 mg daily and HCTZ 12.5 mg daily. Med Adherence: [x]  Yes    []  No Medication side effects: []  Yes    [x]  No Adherence with salt restriction: [x]  Yes    []  No Home Monitoring?: [x]  Yes    []  No Monitoring Frequency:  every now and then Home BP results range:  SOB? []  Yes    [x]  No Chest Pain?: []  Yes    [x]  No Leg swelling?: []  Yes    [x]  No Headaches?: []  Yes    [x]  No Dizziness? []  Yes    [x]  No Comments: palpitations about 1-2x/mth for a few mins.  She has history of PAF and underwent cardioversion in 2019.  She was able to stop the Eliquis several weeks after the cardioversion which was successful.  Obesity:  doing good with eating habits.  Was at 280 lbs. Cut back on fried foods Drinks only water. Eat grill chicken salad.  No pork but some beef.   She walks for 20 minutes daily.  Patient Active Problem List   Diagnosis Date Noted   Abnormal cervical Papanicolaou smear 08/09/2020   Irregular periods 08/09/2020   Vaginal discharge 08/09/2020   Psychophysiological insomnia 08/09/2020   Essential hypertension 07/26/2019   PAF (paroxysmal atrial fibrillation) (HCC) 07/26/2019   Exposure to syphilis 11/04/2017   Obesity 10/12/2013     Current Outpatient Medications on File Prior to Visit  Medication Sig Dispense Refill   medroxyPROGESTERone (DEPO-PROVERA) 150 MG/ML injection Inject 150 mg into the muscle every 3 (three) months.     diclofenac (VOLTAREN) 75 MG EC tablet Take 1 tablet (75 mg total) by mouth 2 (two) times daily.  (Patient not taking: Reported on 05/23/2022) 14 tablet 0   No current facility-administered medications on file prior to visit.    No Known Allergies  Social History   Socioeconomic History   Marital status: Married    Spouse name: Not on file   Number of children: Not on file   Years of education: Not on file   Highest education level: Not on file  Occupational History   Not on file  Tobacco Use   Smoking status: Never   Smokeless tobacco: Never  Vaping Use   Vaping Use: Never used  Substance and Sexual Activity   Alcohol use: Yes   Drug use: No   Sexual activity: Yes    Birth control/protection: Injection  Other Topics Concern   Not on file  Social History Narrative   Lives with husband, son, and daughter   Gets to appointments in her car   No advanced directive    Sister and husband may make medical decisions if unable    No exercise   Social Determinants of Health   Financial Resource Strain: Not on file  Food Insecurity: Not on file  Transportation Needs: Not on file  Physical Activity: Not on file  Stress: Not on file  Social Connections: Not on file  Intimate Partner Violence: Not on file  Family History  Problem Relation Age of Onset   Diabetes Maternal Grandfather    Hypertension Mother    Diabetes Mother    Hypertension Father    Diabetes Sister    Hypertension Sister    Sarcoidosis Brother    Anesthesia problems Neg Hx    Hypotension Neg Hx    Malignant hyperthermia Neg Hx    Pseudochol deficiency Neg Hx     Past Surgical History:  Procedure Laterality Date   LEEP     TOOTH EXTRACTION      ROS: Review of Systems Negative except as stated above  PHYSICAL EXAM: BP (!) 141/101 (BP Location: Left Arm, Patient Position: Sitting, Cuff Size: Large)   Pulse 96   Temp 98.4 F (36.9 C) (Oral)   Ht 5\' 6"  (1.676 m)   Wt 239 lb (108.4 kg)   SpO2 98%   BMI 38.58 kg/m   Wt Readings from Last 3 Encounters:  05/23/22 239 lb (108.4 kg)   09/04/21 259 lb 14.8 oz (117.9 kg)  07/16/21 259 lb 14.8 oz (117.9 kg)  Repeat BP: 145/105  Physical Exam  General appearance - alert, well appearing, and in no distress Mental status - normal [name], behavior, speech, dress, motor activity, and thought processes Neck - supple, no significant adenopathy Chest - clear to auscultation, no wheezes, rales or rhonchi, symmetric air entry Heart - normal rate, regular rhythm, normal S1, S2, no murmurs, rubs, clicks or gallops Extremities - peripheral pulses normal, no pedal edema, no clubbing or cyanosis      Latest Ref Rng & Units 07/16/2021    5:29 PM 07/10/2021    2:22 PM 04/10/2020    3:56 PM  CMP  Glucose 70 - 99 mg/dL 04/12/2020  088  110   BUN 6 - 20 mg/dL 11  6  12    Creatinine 0.44 - 1.00 mg/dL 315   9.45   Sodium 135 - 145 mmol/L 137  136  142   Potassium 3.5 - 5.1 mmol/L 3.0  3.0  4.0   Chloride 98 - 111 mmol/L 100  102  105   CO2 22 - 32 mmol/L 25  23  25    Calcium 8.9 - 10.3 mg/dL 9.4  8.9  9.2    Lipid Panel  No results found for: "CHOL", "TRIG", "HDL", "CHOLHDL", "VLDL", "LDLCALC", "LDLDIRECT"  CBC    Component Value Date/Time   WBC 6.0 07/16/2021 1729   RBC 5.06 07/16/2021 1729   HGB 15.0 07/16/2021 1729   HCT 45.4 07/16/2021 1729   PLT 248 07/16/2021 1729   MCV 89.7 07/16/2021 1729   MCH 29.6 07/16/2021 1729   MCHC 33.0 07/16/2021 1729   RDW 14.8 07/16/2021 1729   LYMPHSABS 2.5 07/10/2021 1422   MONOABS 0.8 07/10/2021 1422   EOSABS 0.1 07/10/2021 1422   BASOSABS 0.0 07/10/2021 1422    ASSESSMENT AND PLAN:  1. Essential hypertension Not at goal. Increase diltiazem to 180 mg daily.  Continue HCTZ 12.5 mg daily. Advised to check blood pressure at least twice a week with goal being 130/80 or lower.  Follow-up with clinical pharmacist in 3 weeks for recheck. - CBC - Comprehensive metabolic panel - Lipid panel - diltiazem (CARTIA XT) 180 MG 24 hr capsule; Take 1 capsule (180 mg total) by mouth daily.   Dispense: 90 capsule; Refill: 1 - hydrochlorothiazide (MICROZIDE) 12.5 MG capsule; Take 1 capsule (12.5 mg total) by mouth daily.  Dispense: 90 capsule; Refill: 1  2. Class  2 severe obesity due to excess calories with serious comorbidity and body mass index (BMI) of 38.0 to 38.9 in adult [facility]) Commended her on weight loss so far.  Encouraged her to continue healthy eating habits and regular exercise that she has been doing.  3. PAF (paroxysmal atrial fibrillation) (HCC) On auscultation today, heart is in sinus rhythm.  She has history of PAF for which she underwent cardioversion successfully in 2019.  However she reports episodes about twice a month of palpitations that lasts a few minutes.  I will refer her back to cardiology to see whether she needs to wear Zio - Ambulatory referral to Cardiology    Patient was given the opportunity to ask questions.  Patient verbalized understanding of the plan and was able to repeat key elements of the plan.   This documentation was completed using Paediatric nurse.  Any transcriptional errors are unintentional.  Orders Placed This Encounter  Procedures   CBC   Comprehensive metabolic panel   Lipid panel   Ambulatory referral to Cardiology     Requested Prescriptions   Signed Prescriptions Disp Refills   diltiazem (CARTIA XT) 180 MG 24 hr capsule 90 capsule 1    Sig: Take 1 capsule (180 mg total) by mouth daily.   hydrochlorothiazide (MICROZIDE) 12.5 MG capsule 90 capsule 1    Sig: Take 1 capsule (12.5 mg total) by mouth daily.    Return in about 4 months (around 09/22/2022) for Appt with [facility] in 3 wks for BP check.  Jonah Blue, MD, FACP

## 2022-05-24 LAB — COMPREHENSIVE METABOLIC PANEL
ALT: 26 IU/L (ref 0–32)
AST: 57 IU/L — ABNORMAL HIGH (ref 0–40)
Albumin/Globulin Ratio: 1.4 (ref 1.2–2.2)
Albumin: 4.4 g/dL (ref 3.9–4.9)
Alkaline Phosphatase: 65 IU/L (ref 44–121)
BUN/Creatinine Ratio: 12 (ref 9–23)
BUN: 8 mg/dL (ref 6–20)
Bilirubin Total: 0.7 mg/dL (ref 0.0–1.2)
CO2: 21 mmol/L (ref 20–29)
Calcium: 9.4 mg/dL (ref 8.7–10.2)
Chloride: 100 mmol/L (ref 96–106)
Creatinine, Ser: 0.65 mg/dL (ref 0.57–1.00)
Globulin, Total: 3.1 g/dL (ref 1.5–4.5)
Glucose: 84 mg/dL (ref 70–99)
Potassium: 3.7 mmol/L (ref 3.5–5.2)
Sodium: 139 mmol/L (ref 134–144)
Total Protein: 7.5 g/dL (ref 6.0–8.5)
eGFR: 118 mL/min/{1.73_m2} (ref >59)

## 2022-05-24 LAB — CBC
Hematocrit: 44.5 % (ref 34.0–46.6)
Hemoglobin: 15.2 g/dL (ref 11.1–15.9)
MCH: 30.8 pg (ref 26.6–33.0)
MCHC: 34.2 g/dL (ref 31.5–35.7)
MCV: 90 fL (ref 79–97)
Platelets: 251 10*3/uL (ref 150–450)
RBC: 4.93 x10E6/uL (ref 3.77–5.28)
RDW: 12.6 % (ref 11.7–15.4)
WBC: 4.4 10*3/uL (ref 3.4–10.8)

## 2022-05-24 LAB — LIPID PANEL
Chol/HDL Ratio: 1.9 ratio (ref 0.0–4.4)
Cholesterol, Total: 139 mg/dL (ref 100–199)
HDL: 72 mg/dL (ref >39)
LDL Chol Calc (NIH): 50 mg/dL (ref 0–99)
Triglycerides: 95 mg/dL (ref 0–149)
VLDL Cholesterol Cal: 17 mg/dL (ref 5–40)

## 2022-05-29 ENCOUNTER — Other Ambulatory Visit: Payer: Self-pay

## 2022-06-04 DIAGNOSIS — Z3042 Encounter for surveillance of injectable contraceptive: Secondary | ICD-10-CM | POA: Diagnosis not present

## 2022-06-10 ENCOUNTER — Other Ambulatory Visit (HOSPITAL_COMMUNITY): Payer: Self-pay

## 2022-06-10 ENCOUNTER — Other Ambulatory Visit: Payer: Self-pay

## 2022-09-02 DIAGNOSIS — Z3042 Encounter for surveillance of injectable contraceptive: Secondary | ICD-10-CM | POA: Diagnosis not present

## 2022-09-02 DIAGNOSIS — Z3009 Encounter for other general counseling and advice on contraception: Secondary | ICD-10-CM | POA: Diagnosis not present

## 2022-10-15 ENCOUNTER — Ambulatory Visit (HOSPITAL_COMMUNITY): Payer: Self-pay

## 2022-10-15 DIAGNOSIS — Z01419 Encounter for gynecological examination (general) (routine) without abnormal findings: Secondary | ICD-10-CM | POA: Diagnosis not present

## 2022-10-15 DIAGNOSIS — Z131 Encounter for screening for diabetes mellitus: Secondary | ICD-10-CM | POA: Diagnosis not present

## 2022-10-15 DIAGNOSIS — N760 Acute vaginitis: Secondary | ICD-10-CM | POA: Diagnosis not present

## 2022-10-15 DIAGNOSIS — Z3042 Encounter for surveillance of injectable contraceptive: Secondary | ICD-10-CM | POA: Diagnosis not present

## 2022-10-15 DIAGNOSIS — Z113 Encounter for screening for infections with a predominantly sexual mode of transmission: Secondary | ICD-10-CM | POA: Diagnosis not present

## 2022-10-15 LAB — HM PAP SMEAR: HM Pap smear: NEGATIVE

## 2022-10-24 IMAGING — DX DG CHEST 2V
2 series · 2 of 2 positions shown · non-contrast
Comparison: June 25, 2019.

CLINICAL DATA: Chest pain.

EXAM:
CHEST - 2 VIEW

[chest pa]
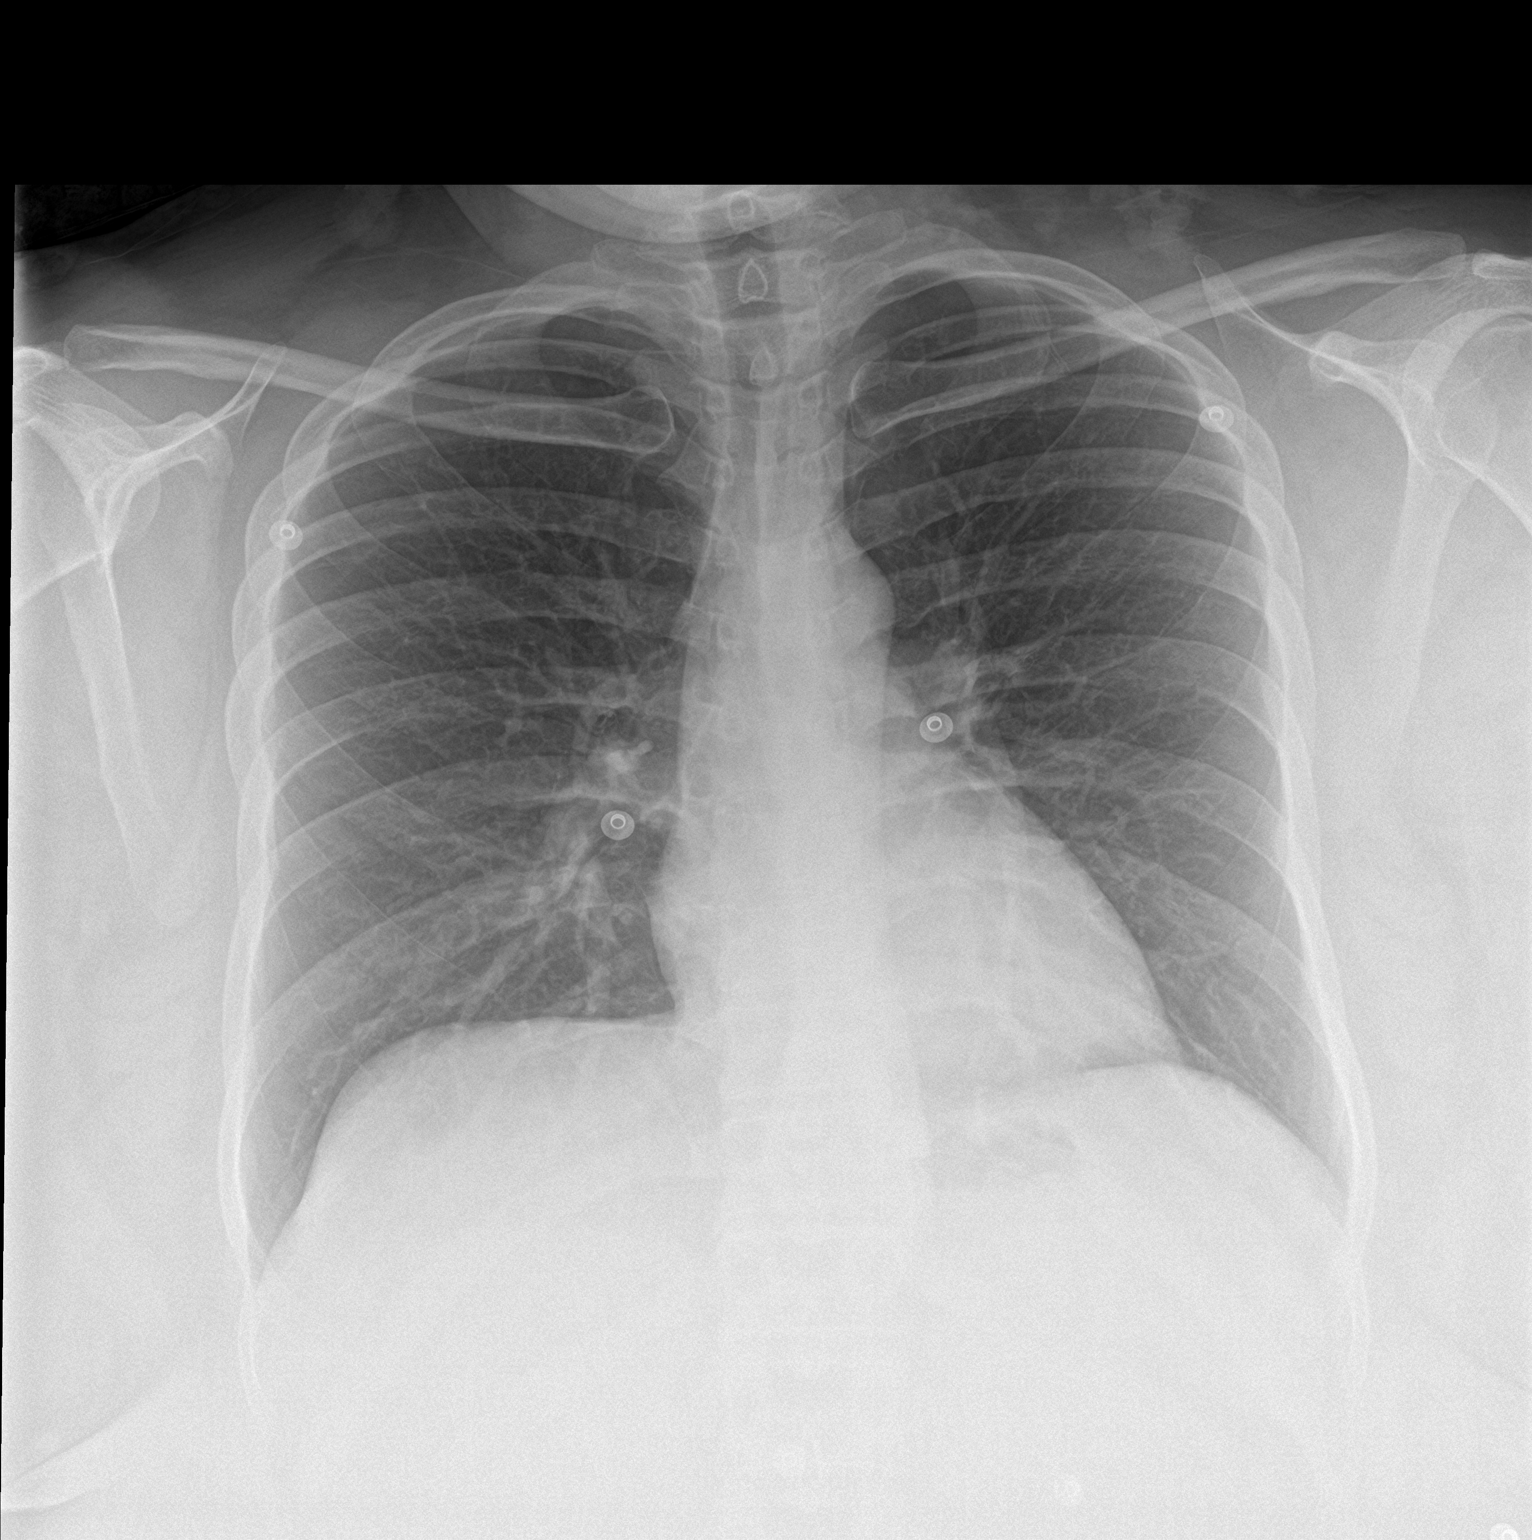

[chest lat]
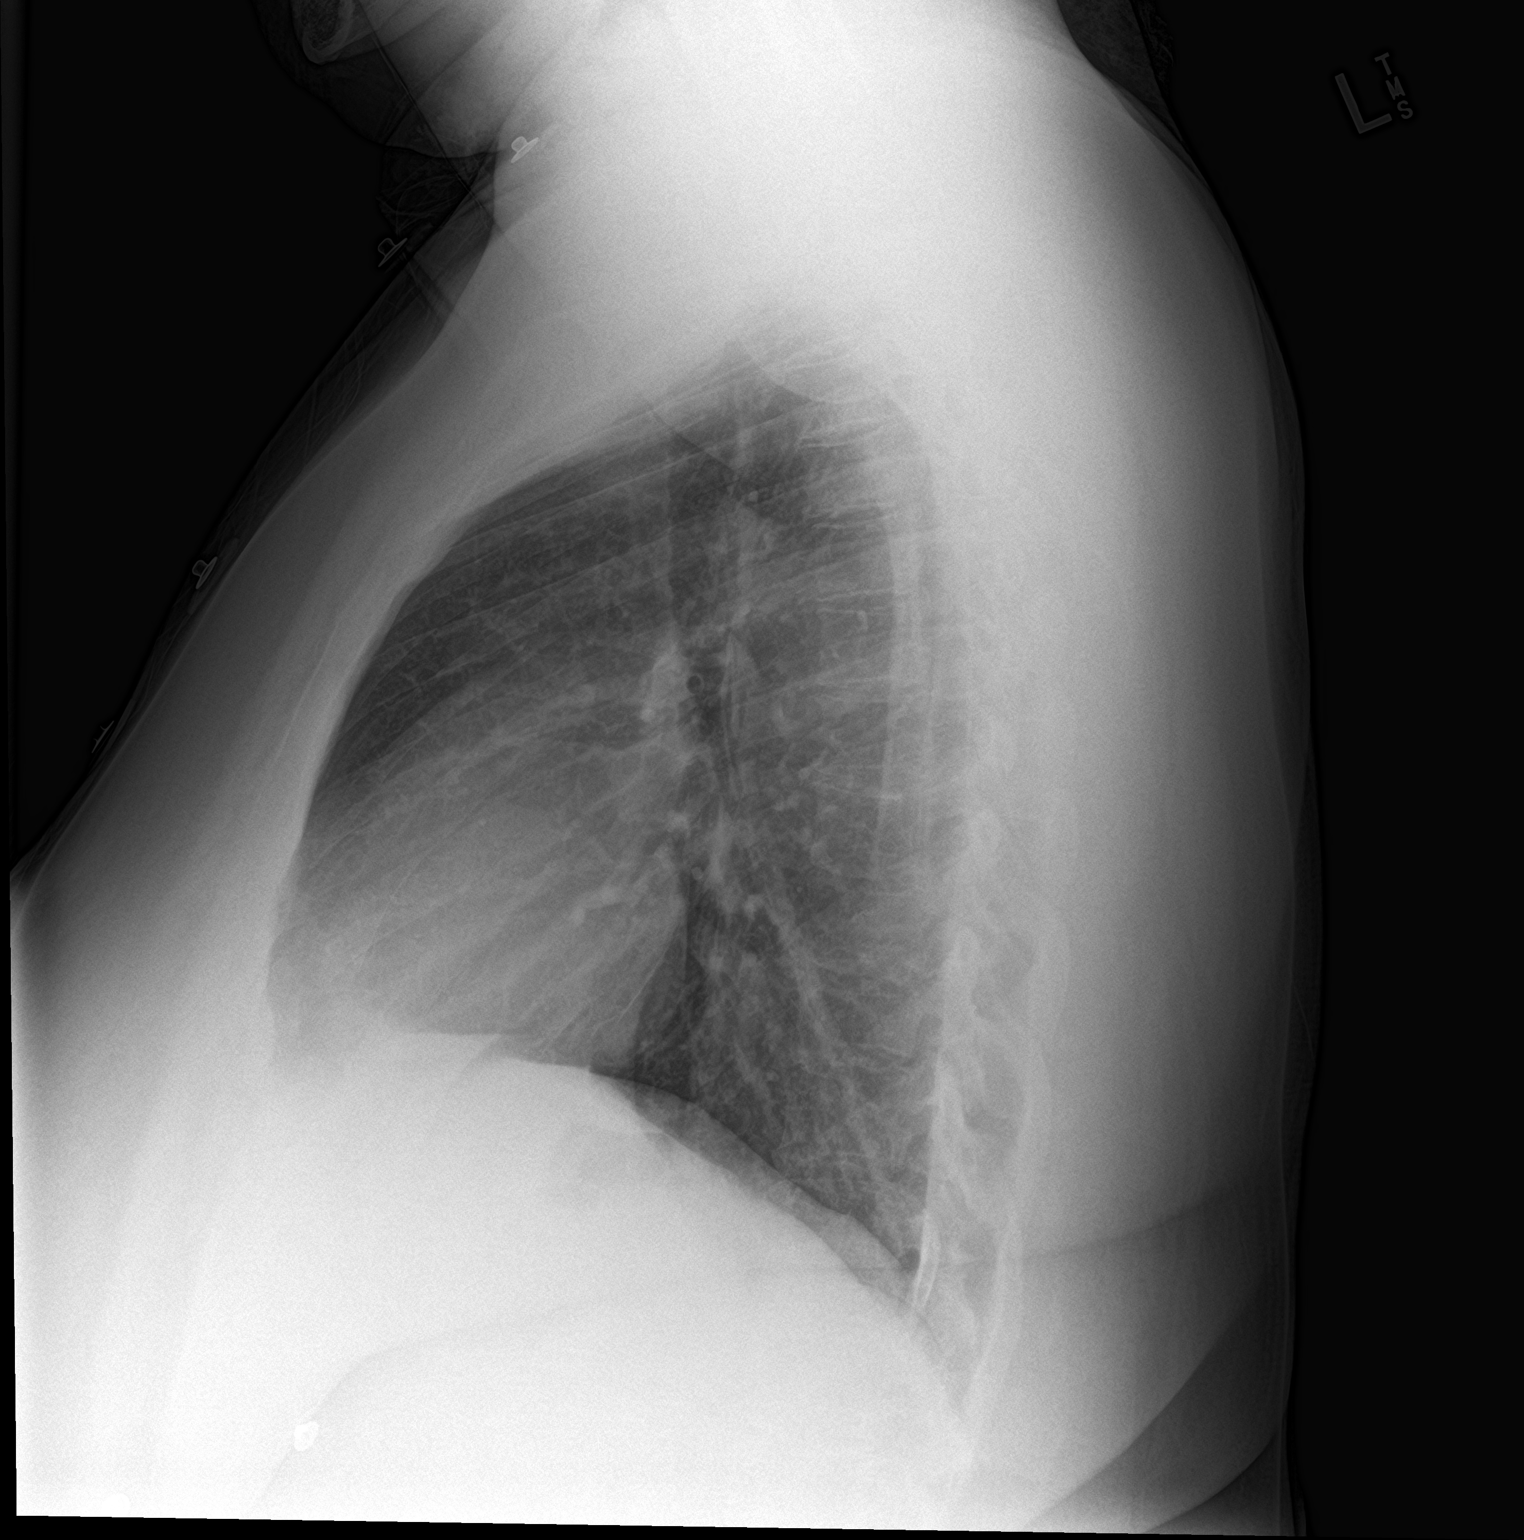

[2 of 2 positions shown; findings below may reference images not displayed]

FINDINGS: The heart size and mediastinal contours are within normal limits.
Both lungs are clear. The visualized skeletal structures are
unremarkable.
IMPRESSION: No active cardiopulmonary disease.

## 2022-10-30 ENCOUNTER — Other Ambulatory Visit: Payer: Self-pay

## 2022-12-10 ENCOUNTER — Inpatient Hospital Stay (HOSPITAL_COMMUNITY)
Admission: EM | Admit: 2022-12-10 | Discharge: 2022-12-12 | DRG: 369 | Disposition: A | Discharge status: Home / Self Care | Payer: Medicaid Other | Attending: Internal Medicine | Admitting: Internal Medicine

## 2022-12-10 ENCOUNTER — Other Ambulatory Visit: Payer: Self-pay

## 2022-12-10 ENCOUNTER — Emergency Department (HOSPITAL_COMMUNITY): Payer: Medicaid Other

## 2022-12-10 DIAGNOSIS — K922 Gastrointestinal hemorrhage, unspecified: Secondary | ICD-10-CM | POA: Diagnosis present

## 2022-12-10 DIAGNOSIS — K769 Liver disease, unspecified: Secondary | ICD-10-CM | POA: Diagnosis present

## 2022-12-10 DIAGNOSIS — Z79899 Other long term (current) drug therapy: Secondary | ICD-10-CM | POA: Diagnosis not present

## 2022-12-10 DIAGNOSIS — T39395A Adverse effect of other nonsteroidal anti-inflammatory drugs [NSAID], initial encounter: Secondary | ICD-10-CM | POA: Diagnosis present

## 2022-12-10 DIAGNOSIS — E669 Obesity, unspecified: Secondary | ICD-10-CM | POA: Diagnosis present

## 2022-12-10 DIAGNOSIS — D62 Acute posthemorrhagic anemia: Secondary | ICD-10-CM | POA: Diagnosis present

## 2022-12-10 DIAGNOSIS — Z833 Family history of diabetes mellitus: Secondary | ICD-10-CM

## 2022-12-10 DIAGNOSIS — K920 Hematemesis: Secondary | ICD-10-CM | POA: Diagnosis not present

## 2022-12-10 DIAGNOSIS — I480 Paroxysmal atrial fibrillation: Secondary | ICD-10-CM | POA: Diagnosis present

## 2022-12-10 DIAGNOSIS — K2211 Ulcer of esophagus with bleeding: Secondary | ICD-10-CM | POA: Diagnosis present

## 2022-12-10 DIAGNOSIS — E876 Hypokalemia: Secondary | ICD-10-CM | POA: Diagnosis present

## 2022-12-10 DIAGNOSIS — R000 Tachycardia, unspecified: Secondary | ICD-10-CM | POA: Diagnosis present

## 2022-12-10 DIAGNOSIS — Z8249 Family history of ischemic heart disease and other diseases of the circulatory system: Secondary | ICD-10-CM | POA: Diagnosis not present

## 2022-12-10 DIAGNOSIS — I110 Hypertensive heart disease with heart failure: Secondary | ICD-10-CM | POA: Diagnosis present

## 2022-12-10 DIAGNOSIS — F1010 Alcohol abuse, uncomplicated: Secondary | ICD-10-CM | POA: Diagnosis not present

## 2022-12-10 DIAGNOSIS — K226 Gastro-esophageal laceration-hemorrhage syndrome: Secondary | ICD-10-CM | POA: Diagnosis not present

## 2022-12-10 DIAGNOSIS — Z1152 Encounter for screening for COVID-19: Secondary | ICD-10-CM

## 2022-12-10 DIAGNOSIS — I10 Essential (primary) hypertension: Secondary | ICD-10-CM | POA: Diagnosis present

## 2022-12-10 DIAGNOSIS — I5032 Chronic diastolic (congestive) heart failure: Secondary | ICD-10-CM | POA: Diagnosis not present

## 2022-12-10 DIAGNOSIS — R58 Hemorrhage, not elsewhere classified: Secondary | ICD-10-CM | POA: Diagnosis present

## 2022-12-10 DIAGNOSIS — E039 Hypothyroidism, unspecified: Secondary | ICD-10-CM | POA: Diagnosis present

## 2022-12-10 DIAGNOSIS — E119 Type 2 diabetes mellitus without complications: Secondary | ICD-10-CM | POA: Diagnosis present

## 2022-12-10 DIAGNOSIS — F1090 Alcohol use, unspecified, uncomplicated: Secondary | ICD-10-CM | POA: Diagnosis not present

## 2022-12-10 DIAGNOSIS — Z6838 Body mass index (BMI) 38.0-38.9, adult: Secondary | ICD-10-CM | POA: Diagnosis not present

## 2022-12-10 LAB — BASIC METABOLIC PANEL
Anion gap: 12 (ref 5–15)
BUN: 5 mg/dL — ABNORMAL LOW (ref 6–20)
CO2: 24 mmol/L (ref 22–32)
Calcium: 8.7 mg/dL — ABNORMAL LOW (ref 8.9–10.3)
Chloride: 98 mmol/L (ref 98–111)
Creatinine, Ser: 0.63 mg/dL (ref 0.44–1.00)
GFR, Estimated: >60 mL/min (ref >60)
Glucose, Bld: 94 mg/dL (ref 70–99)
Potassium: 3.2 mmol/L — ABNORMAL LOW (ref 3.5–5.1)
Sodium: 134 mmol/L — ABNORMAL LOW (ref 135–145)

## 2022-12-10 LAB — COMPREHENSIVE METABOLIC PANEL
ALT: 48 U/L — ABNORMAL HIGH (ref 0–44)
AST: 147 U/L — ABNORMAL HIGH (ref 15–41)
Albumin: 4.1 g/dL (ref 3.5–5.0)
Alkaline Phosphatase: 65 U/L (ref 38–126)
Anion gap: 18 — ABNORMAL HIGH (ref 5–15)
BUN: <5 mg/dL — ABNORMAL LOW (ref 6–20)
CO2: 22 mmol/L (ref 22–32)
Calcium: 9.6 mg/dL (ref 8.9–10.3)
Chloride: 99 mmol/L (ref 98–111)
Creatinine, Ser: 0.68 mg/dL (ref 0.44–1.00)
GFR, Estimated: >60 mL/min (ref >60)
Glucose, Bld: 110 mg/dL — ABNORMAL HIGH (ref 70–99)
Potassium: 2.7 mmol/L — CL (ref 3.5–5.1)
Sodium: 139 mmol/L (ref 135–145)
Total Bilirubin: 1.6 mg/dL — ABNORMAL HIGH (ref 0.3–1.2)
Total Protein: 8.7 g/dL — ABNORMAL HIGH (ref 6.5–8.1)

## 2022-12-10 LAB — TROPONIN I (HIGH SENSITIVITY)
Troponin I (High Sensitivity): 5 ng/L (ref <18)
Troponin I (High Sensitivity): 6 ng/L (ref <18)

## 2022-12-10 LAB — PROTIME-INR
INR: 1 (ref 0.8–1.2)
Prothrombin Time: 12.9 seconds (ref 11.4–15.2)

## 2022-12-10 LAB — CBC
HCT: 43.1 % (ref 36.0–46.0)
HCT: 36.9 % (ref 36.0–46.0)
Hemoglobin: 14.7 g/dL (ref 12.0–15.0)
Hemoglobin: 12.4 g/dL (ref 12.0–15.0)
MCH: 30.9 pg (ref 26.0–34.0)
MCH: 30.3 pg (ref 26.0–34.0)
MCHC: 34.1 g/dL (ref 30.0–36.0)
MCHC: 33.6 g/dL (ref 30.0–36.0)
MCV: 90.7 fL (ref 80.0–100.0)
MCV: 90.2 fL (ref 80.0–100.0)
Platelets: 209 10*3/uL (ref 150–400)
Platelets: 162 10*3/uL (ref 150–400)
RBC: 4.75 MIL/uL (ref 3.87–5.11)
RBC: 4.09 MIL/uL (ref 3.87–5.11)
RDW: 13.8 % (ref 11.5–15.5)
RDW: 13.8 % (ref 11.5–15.5)
WBC: 6.4 10*3/uL (ref 4.0–10.5)
WBC: 5 10*3/uL (ref 4.0–10.5)
nRBC: 0 % (ref 0.0–0.2)
nRBC: 0 % (ref 0.0–0.2)

## 2022-12-10 LAB — TYPE AND SCREEN
ABO/RH(D): B POS
Antibody Screen: NEGATIVE

## 2022-12-10 LAB — MAGNESIUM: Magnesium: 1.4 mg/dL — ABNORMAL LOW (ref 1.7–2.4)

## 2022-12-10 LAB — AMMONIA: Ammonia: 54 umol/L — ABNORMAL HIGH (ref 9–35)

## 2022-12-10 MED ORDER — FOLIC ACID 1 MG PO TABS
1.0000 mg | ORAL_TABLET | Freq: Every day | ORAL | Status: DC
  Administered 2022-12-10 – 2022-12-12 (×3): 1 mg via ORAL
  Filled 2022-12-10 (×3): qty 1

## 2022-12-10 MED ORDER — PANTOPRAZOLE SODIUM 40 MG IV SOLR
40.0000 mg | Freq: Once | INTRAVENOUS | Status: AC
  Administered 2022-12-10: 40 mg via INTRAVENOUS
  Filled 2022-12-10: qty 10

## 2022-12-10 MED ORDER — MAGNESIUM SULFATE 2 GM/50ML IV SOLN
2.0000 g | Freq: Once | INTRAVENOUS | Status: AC
  Administered 2022-12-10: 2 g via INTRAVENOUS
  Filled 2022-12-10: qty 50

## 2022-12-10 MED ORDER — POTASSIUM CHLORIDE 10 MEQ/100ML IV SOLN
10.0000 meq | INTRAVENOUS | Status: AC
  Administered 2022-12-10 (×4): 10 meq via INTRAVENOUS
  Filled 2022-12-10 (×4): qty 100

## 2022-12-10 MED ORDER — ONDANSETRON HCL 4 MG/2ML IJ SOLN: 4.0000 mg | Freq: Four times a day (QID) | INTRAMUSCULAR | Status: DC | PRN

## 2022-12-10 MED ORDER — SODIUM CHLORIDE 0.9 % IV SOLN: INTRAVENOUS | Status: AC

## 2022-12-10 MED ORDER — ACETAMINOPHEN 650 MG RE SUPP: 650.0000 mg | Freq: Four times a day (QID) | RECTAL | Status: DC | PRN

## 2022-12-10 MED ORDER — ONDANSETRON 4 MG PO TBDP
4.0000 mg | ORAL_TABLET | Freq: Once | ORAL | Status: AC
  Administered 2022-12-10: 4 mg via ORAL
  Filled 2022-12-10: qty 1

## 2022-12-10 MED ORDER — ADULT MULTIVITAMIN W/MINERALS CH
1.0000 | ORAL_TABLET | Freq: Every day | ORAL | Status: DC
  Administered 2022-12-10 – 2022-12-12 (×3): 1 via ORAL
  Filled 2022-12-10 (×3): qty 1

## 2022-12-10 MED ORDER — PANTOPRAZOLE INFUSION (NEW) - SIMPLE MED
8.0000 mg/h | INTRAVENOUS | Status: DC
  Administered 2022-12-10 – 2022-12-11 (×2): 8 mg/h via INTRAVENOUS
  Filled 2022-12-10 (×2): qty 100

## 2022-12-10 MED ORDER — THIAMINE MONONITRATE 100 MG PO TABS
100.0000 mg | ORAL_TABLET | Freq: Every day | ORAL | Status: DC
  Administered 2022-12-10 – 2022-12-12 (×3): 100 mg via ORAL
  Filled 2022-12-10 (×3): qty 1

## 2022-12-10 MED ORDER — LORAZEPAM 2 MG/ML IJ SOLN
1.0000 mg | INTRAMUSCULAR | Status: DC | PRN
  Administered 2022-12-10 (×2): 2 mg via INTRAVENOUS
  Filled 2022-12-10 (×2): qty 1

## 2022-12-10 MED ORDER — LORAZEPAM 1 MG PO TABS: 1.0000 mg | ORAL_TABLET | ORAL | Status: DC | PRN

## 2022-12-10 MED ORDER — ACETAMINOPHEN 325 MG PO TABS: 650.0000 mg | ORAL_TABLET | Freq: Four times a day (QID) | ORAL | Status: DC | PRN

## 2022-12-10 MED ORDER — LACTATED RINGERS IV BOLUS
1000.0000 mL | Freq: Once | INTRAVENOUS | Status: AC
  Administered 2022-12-10: 1000 mL via INTRAVENOUS

## 2022-12-10 MED ORDER — THIAMINE HCL 100 MG/ML IJ SOLN
100.0000 mg | Freq: Every day | INTRAMUSCULAR | Status: DC
  Filled 2022-12-10: qty 2

## 2022-12-10 MED ORDER — ONDANSETRON HCL 4 MG PO TABS: 4.0000 mg | ORAL_TABLET | Freq: Four times a day (QID) | ORAL | Status: DC | PRN

## 2022-12-10 MED ORDER — PANTOPRAZOLE SODIUM 40 MG IV SOLR: 40.0000 mg | Freq: Two times a day (BID) | INTRAVENOUS | Status: DC

## 2022-12-10 MED ORDER — HYDROCODONE-ACETAMINOPHEN 5-325 MG PO TABS: 1.0000 | ORAL_TABLET | ORAL | Status: DC | PRN

## 2022-12-10 NOTE — ED Provider Triage Note (Signed)
Emergency Medicine Provider Triage Evaluation Note  Deanna [name] , a 37 y.o. female  was evaluated in triage.  Pt complains of 3 episodes of vomiting since this morning.  She notes the first episode had bright red blood in it but now is a more dark brown color.  Denies any history of liver problems or varices, no fevers or chills at home, no sick contacts, no abdominal pain or changes to bowel or bladder habits.  Use Goody powder couple days ago but denies any current use.  Review of Systems  Positive: As above Negative: As above  Physical Exam  BP (!) 154/109 (BP Location: Right Arm)   Pulse (!) 131   Temp 98.4 F (36.9 C) (Oral)   Resp 20   SpO2 97%  Gen:   Awake, no distress   Resp:  Normal effort  MSK:   Moves extremities without difficulty  Other:  Tachycardia  Medical Decision Making  Medically screening exam initiated at 2:54 PM.  Appropriate orders placed.  Deanna [name] was informed that the remainder of the evaluation will be completed by another provider, this initial triage assessment does not replace that evaluation, and the importance of remaining in the ED until their evaluation is complete.     Arabella Merles, PA-C 12/10/22 1500

## 2022-12-10 NOTE — H&P (Signed)
Deanna [name] JXB:[id number] DOB: 12/20/1985 DOA: 12/10/2022     PCP: Marcine Matar, MD   Outpatient Specialists:   NONE    Patient arrived to ER on 12/10/22 at 1415 Referred by Attending Laurence Spates, MD   Patient coming from:    home Lives  With family    Chief Complaint:   Chief Complaint  Patient presents with   Hematemesis    HPI: Deanna [name] is a 37 y.o. female with medical history significant of A.fib, HTN, obesity alcohol abuse    Presented with  hematemesis Drinks a fifth of liquor a day  Has been having hematemesis today none now  Last ETOh yesterday  Reports painless bleeding first had significant amount of blood but after was less no BRBPR no melena    significant ETOH intake   Does not smoke   Lab Results  Component Value Date   SARSCOV2NAA NEGATIVE 04/25/2020   SARSCOV2NAA Not Detected 04/20/2019   SARSCOV2NAA Not Detected 03/14/2019       Regarding pertinent Chronic problems:       HTN on hydrochlorothiazide, diltiazem   chronic CHF diastolic  - last echo 2019 Recent Results (from the past 13086 hour(s))  ECHOCARDIOGRAM COMPLETE   Collection Time: 04/14/18 12:22 PM   Narrative                              [facility]*                  *[facility]*                          [address]                        [place], [place] [address]                            [phone]  ------------------------------------------------------------------- Transthoracic Echocardiography  Patient:    Deanna [name] MR #:       [id number] Study Date: 04/14/2018 Gender:     F Age:        32 Height:     165.1 cm Weight:     135.2 kg BSA:        2.57 m^2 Pt. Status: Room:   ATTENDING    Linton Ham  REFERRING    Newman Nip  PERFORMING   [facility], Outpatient  SONOGRAPHER  Ilona Sorrel  cc:  ------------------------------------------------------------------- LV EF: 55% -    60%  ------------------------------------------------------------------- Indications:      Atrial fibrillation - 427.31.  ------------------------------------------------------------------- History:   PMH:  No prior cardiac history.  ------------------------------------------------------------------- Study Conclusions  - Left ventricle: The cavity size was normal. Wall thickness was   increased in a pattern of mild LVH. There was mild concentric   hypertrophy. Systolic function was normal. The estimated ejection   fraction was in the range of 55% to 60%. Wall motion was normal;   there were no regional wall motion abnormalities. Doppler   parameters are consistent with abnormal left ventricular   relaxation (grade 1 diastolic dysfunction).   ------------------------------------------------------------------- Prepared and Electronically Authenticated by  Donato Schultz, M.D. 2019-11-20T15:05:41       obesity-  BMI Readings from Last 1 Encounters:  05/23/22 38.58 kg/m      OSA - noncompliant with CPAP     A. Fib -  - CHA2DS2 vas score  2     Not on anticoagulation         -  Rate control:  Currently controlled with   Diltiazem,     While in ER: Clinical Course as of 12/10/22 1904  Wed Dec 10, 2022  1826 GI: will see tomorrow for possible scope, NPO MN [JD]    Clinical Course User Index [JD] Laurence Spates, MD       Lab Orders         Comprehensive metabolic panel         CBC         Protime-INR         Magnesium         hCG, serum, qualitative         POC occult blood, ED      CXR -  NON acute    Following Medications were ordered in ER: Medications  LORazepam (ATIVAN) tablet 1-4 mg ( Oral See Alternative 12/10/22 1627)    Or  LORazepam (ATIVAN) injection 1-4 mg (2 mg Intravenous Given 12/10/22 1627)  thiamine (VITAMIN B1) tablet 100 mg (100 mg Oral Given 12/10/22 1627)    Or  thiamine (VITAMIN B1) injection 100 mg ( Intravenous See Alternative 12/10/22  1627)  folic acid (FOLVITE) tablet 1 mg (1 mg Oral Given 12/10/22 1627)  multivitamin with minerals tablet 1 tablet (1 tablet Oral Given 12/10/22 1627)  potassium chloride 10 mEq in 100 mL IVPB (10 mEq Intravenous New Bag/Given 12/10/22 1849)  magnesium sulfate IVPB 2 g 50 mL (2 g Intravenous New Bag/Given 12/10/22 1817)  ondansetron (ZOFRAN-ODT) disintegrating tablet 4 mg (4 mg Oral Given 12/10/22 1501)  lactated ringers bolus 1,000 mL (0 mLs Intravenous Stopped 12/10/22 1813)  pantoprazole (PROTONIX) injection 40 mg (40 mg Intravenous Given 12/10/22 1627)    _______________________________________________________ ER Provider Called:   [facility]  Dr. Rhea Belton They Recommend admit to medicine   Will see in AM     ED Triage Vitals  Encounter Vitals Group     BP 12/10/22 1446 (!) 154/109     Systolic BP Percentile --      Diastolic BP Percentile --      Pulse Rate 12/10/22 1446 (!) 131     Resp 12/10/22 1446 20     Temp 12/10/22 1446 98.4 F (36.9 C)     Temp Source 12/10/22 1446 Oral     SpO2 12/10/22 1446 97 %     Weight --      Height --      Head Circumference --      Peak Flow --      Pain Score 12/10/22 1449 8     Pain Loc --      Pain Education --      Exclude from Growth Chart --   NWGN(56)@     _________________________________________ Significant initial  Findings: Abnormal Labs Reviewed  COMPREHENSIVE METABOLIC PANEL - Abnormal; Notable for the following components:      Result Value   Potassium 2.7 (*)    Glucose, Bld 110 (*)    BUN <5 (*)    Total Protein 8.7 (*)    AST 147 (*)    ALT 48 (*)    Total Bilirubin 1.6 (*)    Anion gap  18 (*)    All other components within normal limits  MAGNESIUM - Abnormal; Notable for the following components:   Magnesium 1.4 (*)    All other components within normal limits    _________________________ Troponin  ordered Cardiac Panel (last 3 results) Recent Labs    12/10/22 1631  TROPONINIHS 5     ECG: Ordered Personally  reviewed and interpreted by me showing: HR : 116 Rhythm: Sinus tachycardia Prolonged PR interval Borderline abnrm T, anterolateral leads Prolonged QT interval Baseline wander in lead(s) V4 QTC 499  BNP (last 3 results) No results for input(s): "BNP" in the last 8760 hours.   COVID-19 Labs  No results for input(s): "DDIMER", "FERRITIN", "LDH", "CRP" in the last 72 hours.  Lab Results  Component Value Date   SARSCOV2NAA NEGATIVE 04/25/2020   SARSCOV2NAA Not Detected 04/20/2019   SARSCOV2NAA Not Detected 03/14/2019       ____________________ This patient meets SIRS Criteria and may be septic. SIRS = Systemic Inflammatory Response Syndrome  Order a lactic acid level if needed AND/OR Initiate the sepsis protocol with the attached order set OR Click "Treating Associated Infection or Illness" if the patient is being treated for an infection that is a known cause of these abnormalities     The recent clinical data is shown below. Vitals:   12/10/22 1745 12/10/22 1800 12/10/22 1815 12/10/22 1850  BP: (!) 146/86 (!) 152/89 (!) 149/79   Pulse: 100 (!) 105 (!) 105   Resp: (!) 22 17 19    Temp:    98 F (36.7 C)  TempSrc:      SpO2: 99% 98% 99%        WBC     Component Value Date/Time   WBC 6.4 12/10/2022 1459   LYMPHSABS 2.5 07/10/2021 1422   MONOABS 0.8 07/10/2021 1422   EOSABS 0.1 07/10/2021 1422   BASOSABS 0.0 07/10/2021 1422         UA  ordered      __________________________________________________________ Recent Labs  Lab 12/10/22 1459 12/10/22 1631  NA 139  --   K 2.7*  --   CO2 22  --   GLUCOSE 110*  --   BUN <5*  --   CREATININE 0.68  --   CALCIUM 9.6  --   MG  --  1.4*    Cr  stable,   Lab Results  Component Value Date   CREATININE 0.68 12/10/2022   CREATININE 0.65 05/23/2022   CREATININE 0.65 07/16/2021    Recent Labs  Lab 12/10/22 1459  AST 147*  ALT 48*  ALKPHOS 65  BILITOT 1.6*  PROT 8.7*  ALBUMIN 4.1   Lab Results   Component Value Date   CALCIUM 9.6 12/10/2022       Plt: Lab Results  Component Value Date   PLT 209 12/10/2022      Recent Labs  Lab 12/10/22 1459 12/10/22 2252  WBC 6.4 5.0  HGB 14.7 12.4  HCT 43.1 36.9  MCV 90.7 90.2  PLT 209 162    HG/HCT   stable,       Component Value Date/Time   HGB 14.7 12/10/2022 1459   HGB 15.2 05/23/2022 1139   HCT 43.1 12/10/2022 1459   HCT 44.5 05/23/2022 1139   MCV 90.7 12/10/2022 1459   MCV 90 05/23/2022 1139      Recent Labs  Lab 12/10/22 1911  AMMONIA 54*    _______________________________________________ Hospitalist was called for admission for hematemesis The following Work  up has been ordered so far:  Orders Placed This Encounter  Procedures   DG Chest 1 View   Comprehensive metabolic panel   CBC   Protime-INR   Magnesium   hCG, serum, qualitative   Diet NPO time specified   Initiate Carrier Fluid Protocol   Clinical Institute Withdrawal Assessent (CIWA)   If Ativan given, reassess Clinical Institute Withdrawal Assessment (CIWA) q 1 hour   Notify Pharmacy to change IV Ativan to PO if tolerating POs well.   Notify physician (specify)   Consult to gastroenterology   Consult for [facility] Admission   POC occult blood, ED   ED EKG   EKG 12-Lead   Type and screen [facility]     OTHER Significant initial  Findings:  labs showing:     DM  labs:  HbA1C: No results for input(s): "HGBA1C" in the last 8760 hours.     CBG (last 3)  No results for input(s): "GLUCAP" in the last 72 hours.        Cultures:    Component Value Date/Time   SDES URINE, CLEAN CATCH 12/07/2015 1045   SPECREQUEST NONE 12/07/2015 1045   CULT MULTIPLE SPECIES PRESENT, SUGGEST RECOLLECTION (A) 12/07/2015 1045   REPTSTATUS 12/08/2015 FINAL 12/07/2015 1045     Radiological Exams on Admission: DG Chest 1 View  Result Date: 12/10/2022 CLINICAL DATA:  Vomiting EXAM: CHEST  1 VIEW COMPARISON:  None Available.  FINDINGS: The heart size and mediastinal contours are within normal limits. Both lungs are clear. The visualized skeletal structures are unremarkable. IMPRESSION: No active disease. Electronically Signed   By: Judie Petit.  Shick M.D.   On: 12/10/2022 15:44   _______________________________________________________________________________________________________ Latest  Blood pressure (!) 149/79, pulse (!) 105, temperature 98 F (36.7 C), resp. rate 19, SpO2 99%.   Vitals  labs and radiology finding personally reviewed  Review of Systems:    Pertinent positives include:  hematemesis  Constitutional:  No weight loss, night sweats, Fevers, chills, fatigue, weight loss  HEENT:  No headaches, Difficulty swallowing,Tooth/dental problems,Sore throat,  No sneezing, itching, ear ache, nasal congestion, post nasal drip,  Cardio-vascular:  No chest pain, Orthopnea, PND, anasarca, dizziness, palpitations.no Bilateral lower extremity swelling  GI:  No heartburn, indigestion, abdominal pain, nausea, vomiting, diarrhea, change in bowel habits, loss of appetite, melena, blood in stool,  Resp:  no shortness of breath at rest. No dyspnea on exertion, No excess mucus, no productive cough, No non-productive cough, No coughing up of blood.No change in color of mucus.No wheezing. Skin:  no rash or lesions. No jaundice GU:  no dysuria, change in color of urine, no urgency or frequency. No straining to urinate.  No flank pain.  Musculoskeletal:  No joint pain or no joint swelling. No decreased range of motion. No back pain.  Psych:  No change in mood or affect. No depression or anxiety. No memory loss.  Neuro: no localizing neurological complaints, no tingling, no weakness, no double vision, no gait abnormality, no slurred speech, no confusion  All systems reviewed and apart from HOPI all are negative _______________________________________________________________________________________________ Past Medical  History:   Past Medical History:  Diagnosis Date   Abnormal Pap smear    Atrial fibrillation (HCC)    Hypertension    Infection    UTI   Normal pregnancy 10/04/2011   Obese    SVD (spontaneous vaginal delivery) 10/04/2011   Trichomonas    Vaginal Pap smear, abnormal     Past Surgical History:  Procedure Laterality Date   LEEP     TOOTH EXTRACTION      Social History:  Ambulatory   independently      reports that she has never smoked. She has never used smokeless tobacco. She reports current alcohol use. She reports that she does not use drugs.   Family History:  Family History  Problem Relation Age of Onset   Diabetes Maternal Grandfather    Hypertension Mother    Diabetes Mother    Hypertension Father    Diabetes Sister    Hypertension Sister    Sarcoidosis Brother    Anesthesia problems Neg Hx    Hypotension Neg Hx    Malignant hyperthermia Neg Hx    Pseudochol deficiency Neg Hx    ______________________________________________________________________________________________ Allergies: No Known Allergies   Prior to Admission medications   Medication Sig Start Date End Date Taking? Authorizing Provider  diltiazem (CARTIA XT) 180 MG 24 hr capsule Take 1 capsule (180 mg total) by mouth daily. 05/23/22  Yes Marcine Matar, MD  hydrochlorothiazide (MICROZIDE) 12.5 MG capsule Take 1 capsule (12.5 mg total) by mouth daily. 05/23/22  Yes Marcine Matar, MD  medroxyPROGESTERone (DEPO-PROVERA) 150 MG/ML injection Inject 150 mg into the muscle every 3 (three) months.   Yes [provider]    ___________________________________________________________________________________________________ Physical Exam:    12/10/2022    6:15 PM 12/10/2022    6:00 PM 12/10/2022    5:45 PM  Vitals with BMI  Systolic 149 152 846  Diastolic 79 89 86  Pulse 105 105 100     1. General:  in No  Acute distress   Chronically ill   -appearing 2. Psychological: Alert  and   Oriented 3. Head/ENT:    Dry Mucous Membranes                          Head Non traumatic, neck supple                       Poor Dentition 4. SKIN:   decreased Skin turgor,  Skin clean Dry and intact no rash    5. Heart: Regular rate and rhythm no  Murmur, no Rub or gallop 6. Lungs:   no wheezes or crackles   7. Abdomen: Soft,  non-tender, Non distended   obese  bowel sounds present 8. Lower extremities: no clubbing, cyanosis, no  edema 9. Neurologically Grossly intact, moving all 4 extremities equally mild tremor 10. MSK: Normal range of motion    Chart has been reviewed  ______________________________________________________________________________________________  Assessment/Plan 37 y.o. female with medical history significant of A.fib, HTN, obesity alcohol abuse  Admitted for hematemesis Present on Admission:  Upper GI bleed  Obesity  Essential hypertension  PAF (paroxysmal atrial fibrillation) (HCC)  Alcohol abuse  Chronic diastolic CHF (congestive heart failure) (HCC)  Hypokalemia     Upper GI bleed  - Glasgow Blatchford score    HR >100  CHF   >1 Justifies admission and aggressive management      Modifying risk factors include:    NSAIDS use alcohol abuse   liver disease     Admit to progressive given above    -  ER  Provider spoke to gastroenterology ( [facility]) they will see patient in a.m. appreciate their consult   - serial CBC.    - Monitor for any recurrence,  evidence of hemodynamic instability or significant blood loss  -  Transfuse as needed for hemoglobin below 7 or evidence of life-threatening bleeding  - Establish at least 2 PIV and fluid resuscitate   - clear liquids for tonight keep nothing by mouth post midnight,   -  administer Protonix drip Mallory-Weiss tear suspected currently hematemesis has resolved  Obesity Chronic stable will need to have follow-up as an outpatient  Essential hypertension Allow permissive hypertension for  tonight  PAF (paroxysmal atrial fibrillation) (HCC) Not on anticoagulation Hold cardizem  Alcohol abuse  Will order CIWA protocol spoke at length with patient regarding need to quit.  Patient states understanding  Chronic diastolic CHF (congestive heart failure) (HCC) Currently appears to be euvolemic we will continue to monitor gently rehydrate and monitor fluid status  Hypokalemia Hypokalemia and hypomagnesemia will replete and recheck    Other plan as per orders.  DVT prophylaxis:  SCD     Code Status:    Code Status: Not on file FULL CODE care as per patient   I had personally discussed CODE STATUS with patient and family   ACP none   Family Communication:   Family  at  Bedside  plan of care was discussed  with  , Daughter,  mother  Diet  Diet Orders (From admission, onward)     Start     Ordered   12/10/22 1451  Diet NPO time specified  Diet effective now        12/10/22 1450            Disposition Plan:       To home once workup is complete and patient is stable   Following barriers for discharge:                            Electrolytes corrected                              Hemoglobin stable                                                        Will need consultants to evaluate patient prior to discharge                      Transition of care consulted       Consults called: [facility]   Admission status:  ED Disposition     ED Disposition  Admit   Condition  --   Comment  Hospital Area: [facility] [[id number]]  Level of Care: Progressive [102]  Admit to Progressive based on following criteria: GI, ENDOCRINE disease patients with GI bleeding, acute liver failure or pancreatitis, stable with diabetic ketoacidosis or thyrotoxicosis (hypothyroid) state.  May admit patient to [facility] or [facility] if equivalent level of care is available:: No  Covid Evaluation: Asymptomatic - no recent exposure (last 10 days) testing not required   Diagnosis: Upper GI bleed [161096]  Admitting Physician: Therisa Doyne [[id number]]  Attending Physician: Therisa Doyne [[id number]]  Certification:: I certify this patient will need inpatient services for at least 2 midnights  Estimated Length of Stay: 2          inpatient     I Expect 2 midnight stay secondary to  severity of patient's current illness need for inpatient interventions justified by the following:     Severe lab/radiological/exam abnormalities including:   Hematemesis and extensive comorbidities including:  substance abuse   CHF     Obesity    That are currently affecting medical management.   I expect  patient to be hospitalized for 2 midnights requiring inpatient medical care.  Patient is at high risk for adverse outcome (such as loss of life or disability) if not treated.  Indication for inpatient stay as follows:      Need for operative/procedural  intervention     Need for   IV fluids, IV PPI    Level of care        progressive tele indefinitely please discontinue once patient no longer qualifies COVID-19 Labs    Hesham Womac 12/11/2022, 12:46 AM    [facility]     after 2 AM please page floor coverage PA If 7AM-7PM, please contact the day team taking care of the patient using [email or web address]

## 2022-12-10 NOTE — ED Triage Notes (Signed)
Pt reports that this morning she began having gross amount of bright red hematemesis. She's had two more episodes of vomiting and it is now dark red per pt. Hx of alcoholism with 1/5 of liquor every other day. Pt denies hx of esophageal varices. Endorses dizziness and SOB.

## 2022-12-10 NOTE — ED Provider Notes (Signed)
[place] EMERGENCY DEPARTMENT AT [facility] Provider Note   CSN: [id number] Arrival date & time: 12/10/22  1415     History  Chief Complaint  Patient presents with   Hematemesis    Deanna [name] is a 37 y.o. female.  HPI 37 year old female history of hypertension presenting for hematemesis.  She states she was feeling well until last night.  This morning she woke up and has had about 3 episodes of hematemesis.  Last episode was dark red.  She has had some dizziness, and earlier had some chest pain which was brief and substernal nonradiating.  No difficulty breathing.  No hematochezia or melena.  No history of GI bleeding or ulcers.  She is not on anticoagulation.  She drinks alcohol about 1/5 of liquor daily last drink yesterday.  She does feel somewhat shaky and feels like she is withdrawing.  She has withdrawn before.     Home Medications Prior to Admission medications   Medication Sig Start Date End Date Taking? Authorizing Provider  diltiazem (CARTIA XT) 180 MG 24 hr capsule Take 1 capsule (180 mg total) by mouth daily. 05/23/22  Yes Marcine Matar, MD  hydrochlorothiazide (MICROZIDE) 12.5 MG capsule Take 1 capsule (12.5 mg total) by mouth daily. 05/23/22  Yes Marcine Matar, MD  medroxyPROGESTERone (DEPO-PROVERA) 150 MG/ML injection Inject 150 mg into the muscle every 3 (three) months.   Yes [provider]      Allergies    Patient has no known allergies.    Review of Systems   Review of Systems Review of systems completed and notable as per HPI.  ROS otherwise negative.   Physical Exam Updated Vital Signs BP (!) 149/79   Pulse (!) 105   Temp 98 F (36.7 C)   Resp 19   SpO2 99%  Physical Exam Vitals and nursing note reviewed.  Constitutional:      Appearance: She is well-developed.  HENT:     Head: Normocephalic and atraumatic.     Mouth/Throat:     Mouth: Mucous membranes are moist.     Pharynx: Oropharynx is clear.  Eyes:      Conjunctiva/sclera: Conjunctivae normal.  Cardiovascular:     Rate and Rhythm: Regular rhythm. Tachycardia present.     Heart sounds: No murmur heard. Pulmonary:     Effort: Pulmonary effort is normal. No respiratory distress.     Breath sounds: Normal breath sounds.  Abdominal:     Palpations: Abdomen is soft.     Tenderness: There is no abdominal tenderness. There is no guarding or rebound.  Musculoskeletal:        General: No swelling.     Cervical back: Neck supple.     Right lower leg: No edema.     Left lower leg: No edema.  Skin:    General: Skin is warm and dry.     Capillary Refill: Capillary refill takes less than 2 seconds.  Neurological:     General: No focal deficit present.     Mental Status: She is alert and oriented to person, place, and time.     Cranial Nerves: No cranial nerve deficit.     Comments: Mild tremor  Psychiatric:        Mood and Affect: Mood normal.     ED Results / Procedures / Treatments   Labs (all labs ordered are listed, but only abnormal results are displayed) Labs Reviewed  COMPREHENSIVE METABOLIC PANEL - Abnormal; Notable for  the following components:      Result Value   Potassium 2.7 (*)    Glucose, Bld 110 (*)    BUN <5 (*)    Total Protein 8.7 (*)    AST 147 (*)    ALT 48 (*)    Total Bilirubin 1.6 (*)    Anion gap 18 (*)    All other components within normal limits  MAGNESIUM - Abnormal; Notable for the following components:   Magnesium 1.4 (*)    All other components within normal limits  CBC  PROTIME-INR  HCG, SERUM, QUALITATIVE  AMMONIA  PHOSPHORUS  CK  PREALBUMIN  TSH  URINALYSIS, COMPLETE (UACMP) WITH MICROSCOPIC  CBC  CBC  BASIC METABOLIC PANEL  RAPID URINE DRUG SCREEN, HOSP PERFORMED  ETHANOL  POC OCCULT BLOOD, ED  TYPE AND SCREEN  TROPONIN I (HIGH SENSITIVITY)  TROPONIN I (HIGH SENSITIVITY)    EKG EKG Interpretation Date/Time:  Wednesday December 10 2022 16:30:45 EDT Ventricular Rate:  116 PR  Interval:  206 QRS Duration:  100 QT Interval:  359 QTC Calculation: 499 R Axis:   40  Text Interpretation: Sinus tachycardia Prolonged PR interval Borderline abnrm T, anterolateral leads Prolonged QT interval Baseline wander in lead(s) V4 Confirmed by Fulton Reek [phone]) on 12/10/2022 5:01:26 PM  Radiology DG Chest 1 View  Result Date: 12/10/2022 CLINICAL DATA:  Vomiting EXAM: CHEST  1 VIEW COMPARISON:  None Available. FINDINGS: The heart size and mediastinal contours are within normal limits. Both lungs are clear. The visualized skeletal structures are unremarkable. IMPRESSION: No active disease. Electronically Signed   By: Judie Petit.  Shick M.D.   On: 12/10/2022 15:44    Procedures Procedures    Medications Ordered in ED Medications  LORazepam (ATIVAN) tablet 1-4 mg ( Oral See Alternative 12/10/22 1943)    Or  LORazepam (ATIVAN) injection 1-4 mg (2 mg Intravenous Given 12/10/22 1943)  thiamine (VITAMIN B1) tablet 100 mg (100 mg Oral Given 12/10/22 1627)    Or  thiamine (VITAMIN B1) injection 100 mg ( Intravenous See Alternative 12/10/22 1627)  folic acid (FOLVITE) tablet 1 mg (1 mg Oral Given 12/10/22 1627)  multivitamin with minerals tablet 1 tablet (1 tablet Oral Given 12/10/22 1627)  potassium chloride 10 mEq in 100 mL IVPB (10 mEq Intravenous New Bag/Given 12/10/22 2030)  pantoprozole (PROTONIX) 80 mg /NS 100 mL infusion (8 mg/hr Intravenous New Bag/Given 12/10/22 2034)  pantoprazole (PROTONIX) injection 40 mg (has no administration in time range)  ondansetron (ZOFRAN-ODT) disintegrating tablet 4 mg (4 mg Oral Given 12/10/22 1501)  lactated ringers bolus 1,000 mL (0 mLs Intravenous Stopped 12/10/22 1813)  pantoprazole (PROTONIX) injection 40 mg (40 mg Intravenous Given 12/10/22 1627)  magnesium sulfate IVPB 2 g 50 mL (0 g Intravenous Stopped 12/10/22 1922)  pantoprazole (PROTONIX) injection 40 mg (40 mg Intravenous Given 12/10/22 1936)    ED Course/ Medical Decision Making/ A&P Clinical  Course as of 12/10/22 2107  Wed Dec 10, 2022  1826 GI: will see tomorrow for possible scope, NPO MN [JD]    Clinical Course User Index [JD] Laurence Spates, MD                             Medical Decision Making Amount and/or Complexity of Data Reviewed Labs: ordered.  Risk OTC drugs. Prescription drug management. Decision regarding hospitalization.   Medical Decision Making:   Deanna [name] is a 37 y.o. female who presented to  the ED today with hematemesis.  Vital signs notable for tachycardia and hypertension.  She notes 1 day of the left is a hematemesis, no evidence of lower GI bleeding.  I am concerned for gastritis or peptic ulcer disease likely related to chronic alcohol use.  IV Protonix ordered.  She complained of some chest pain earlier, EKG shows some inferolateral T wave changes likely rate related.  Will obtain troponin to lower concern for ACS.  No shortness of breath or signs or symptoms of PE.  Her abdominal exam here is benign, no indication for imaging at this time.  Will plan to evaluate with lab workup.  Also concern for withdrawal as well given she is tachycardic, hypertensive and last drink yesterday.  CIWA with Ativan ordered.   Patient placed on continuous vitals and telemetry monitoring while in ED which was reviewed periodically.  Reviewed and confirmed nursing documentation for past medical history, family history, social history.  Initial Study Results:   Laboratory  All laboratory results reviewed.  Labs notable for severe hypokalemia  EKG EKG was reviewed independently. Rate, rhythm, axis, intervals all examined and without medically relevant abnormality.  Anterolateral nonspecific ST abnormalities likely related to rate.  Radiology:  All images reviewed independently.  Chest x-ray unremarkable.  Agree with radiology report at this time.      Consults: Case discussed with Dr. Rhea Belton with gastroenterology.   Reassessment and Plan:   On  reassessment patient remained stable.  Tachycardia and hypertension improved with fluids, Ativan.  Presentation is concerning for gastritis or peptic ulcer disease likely related to alcohol intake.  She also has significant hypokalemia and hypomagnesemia from vomiting.  Given this, discussed patient with gastroenterology who plans for possible scope tomorrow.  I discussed patient with hospitalist, she was admitted for further management.   Patient's presentation is most consistent with acute presentation with potential threat to life or bodily function.           Final Clinical Impression(s) / ED Diagnoses Final diagnoses:  Hematemesis with nausea    Rx / DC Orders ED Discharge Orders     None         Laurence Spates, MD 12/10/22 2107

## 2022-12-10 NOTE — Subjective & Objective (Signed)
Drinks a fifth of liquor a day  Has been having hematemesis today none now  Last ETOh yesterday

## 2022-12-11 ENCOUNTER — Encounter (HOSPITAL_COMMUNITY): Payer: Self-pay | Admitting: Internal Medicine

## 2022-12-11 ENCOUNTER — Encounter (HOSPITAL_COMMUNITY): Payer: Self-pay | Admitting: Anesthesiology

## 2022-12-11 ENCOUNTER — Inpatient Hospital Stay (HOSPITAL_COMMUNITY): Payer: Medicaid Other

## 2022-12-11 ENCOUNTER — Other Ambulatory Visit: Payer: Self-pay

## 2022-12-11 DIAGNOSIS — F1010 Alcohol abuse, uncomplicated: Secondary | ICD-10-CM | POA: Diagnosis present

## 2022-12-11 DIAGNOSIS — E876 Hypokalemia: Secondary | ICD-10-CM | POA: Diagnosis present

## 2022-12-11 DIAGNOSIS — K922 Gastrointestinal hemorrhage, unspecified: Secondary | ICD-10-CM

## 2022-12-11 DIAGNOSIS — I5032 Chronic diastolic (congestive) heart failure: Secondary | ICD-10-CM | POA: Diagnosis present

## 2022-12-11 LAB — RAPID URINE DRUG SCREEN, HOSP PERFORMED
Amphetamines: NOT DETECTED
Barbiturates: NOT DETECTED
Benzodiazepines: POSITIVE — AB
Cocaine: NOT DETECTED
Opiates: NOT DETECTED
Tetrahydrocannabinol: NOT DETECTED

## 2022-12-11 LAB — COMPREHENSIVE METABOLIC PANEL
ALT: 34 U/L (ref 0–44)
AST: 78 U/L — ABNORMAL HIGH (ref 15–41)
Albumin: 3.1 g/dL — ABNORMAL LOW (ref 3.5–5.0)
Alkaline Phosphatase: 48 U/L (ref 38–126)
Anion gap: 12 (ref 5–15)
BUN: <5 mg/dL — ABNORMAL LOW (ref 6–20)
CO2: 24 mmol/L (ref 22–32)
Calcium: 8.5 mg/dL — ABNORMAL LOW (ref 8.9–10.3)
Chloride: 99 mmol/L (ref 98–111)
Creatinine, Ser: 0.64 mg/dL (ref 0.44–1.00)
GFR, Estimated: >60 mL/min (ref >60)
Glucose, Bld: 88 mg/dL (ref 70–99)
Potassium: 3.2 mmol/L — ABNORMAL LOW (ref 3.5–5.1)
Sodium: 135 mmol/L (ref 135–145)
Total Bilirubin: 1.9 mg/dL — ABNORMAL HIGH (ref 0.3–1.2)
Total Protein: 6.6 g/dL (ref 6.5–8.1)

## 2022-12-11 LAB — CBC
HCT: 36.5 % (ref 36.0–46.0)
HCT: 34.1 % — ABNORMAL LOW (ref 36.0–46.0)
HCT: 36.5 % (ref 36.0–46.0)
Hemoglobin: 12 g/dL (ref 12.0–15.0)
Hemoglobin: 11.8 g/dL — ABNORMAL LOW (ref 12.0–15.0)
Hemoglobin: 12.6 g/dL (ref 12.0–15.0)
MCH: 30.1 pg (ref 26.0–34.0)
MCH: 31.8 pg (ref 26.0–34.0)
MCH: 31.7 pg (ref 26.0–34.0)
MCHC: 32.9 g/dL (ref 30.0–36.0)
MCHC: 34.6 g/dL (ref 30.0–36.0)
MCHC: 34.5 g/dL (ref 30.0–36.0)
MCV: 91.5 fL (ref 80.0–100.0)
MCV: 91.9 fL (ref 80.0–100.0)
MCV: 91.9 fL (ref 80.0–100.0)
Platelets: 164 10*3/uL (ref 150–400)
Platelets: 159 10*3/uL (ref 150–400)
Platelets: 156 10*3/uL (ref 150–400)
RBC: 3.99 MIL/uL (ref 3.87–5.11)
RBC: 3.71 MIL/uL — ABNORMAL LOW (ref 3.87–5.11)
RBC: 3.97 MIL/uL (ref 3.87–5.11)
RDW: 14 % (ref 11.5–15.5)
RDW: 13.6 % (ref 11.5–15.5)
RDW: 13.5 % (ref 11.5–15.5)
WBC: 4.1 10*3/uL (ref 4.0–10.5)
WBC: 4.5 10*3/uL (ref 4.0–10.5)
WBC: 4.5 10*3/uL (ref 4.0–10.5)
nRBC: 0 % (ref 0.0–0.2)
nRBC: 0 % (ref 0.0–0.2)
nRBC: 0 % (ref 0.0–0.2)

## 2022-12-11 LAB — URINALYSIS, COMPLETE (UACMP) WITH MICROSCOPIC
Bacteria, UA: NONE SEEN
Glucose, UA: NEGATIVE mg/dL
Hgb urine dipstick: NEGATIVE
Ketones, ur: 5 mg/dL — AB
Leukocytes,Ua: NEGATIVE
Nitrite: NEGATIVE
Protein, ur: NEGATIVE mg/dL
Specific Gravity, Urine: 1.02 (ref 1.005–1.030)
pH: 7 (ref 5.0–8.0)

## 2022-12-11 LAB — HIV ANTIBODY (ROUTINE TESTING W REFLEX): HIV Screen 4th Generation wRfx: NONREACTIVE

## 2022-12-11 LAB — PREALBUMIN: Prealbumin: 25 mg/dL (ref 18–38)

## 2022-12-11 LAB — ABO/RH: ABO/RH(D): B POS

## 2022-12-11 LAB — CK: Total CK: 119 U/L (ref 38–234)

## 2022-12-11 LAB — MRSA NEXT GEN BY PCR, NASAL: MRSA by PCR Next Gen: NOT DETECTED

## 2022-12-11 LAB — TSH: TSH: 8.54 u[IU]/mL — ABNORMAL HIGH (ref 0.350–4.500)

## 2022-12-11 LAB — PHOSPHORUS
Phosphorus: 3 mg/dL (ref 2.5–4.6)
Phosphorus: 2.8 mg/dL (ref 2.5–4.6)

## 2022-12-11 LAB — ETHANOL: Alcohol, Ethyl (B): <10 mg/dL (ref <10)

## 2022-12-11 LAB — MAGNESIUM: Magnesium: 2 mg/dL (ref 1.7–2.4)

## 2022-12-11 LAB — T4, FREE: Free T4: 1.19 ng/dL — ABNORMAL HIGH (ref 0.61–1.12)

## 2022-12-11 MED ORDER — POTASSIUM CHLORIDE 2 MEQ/ML IV SOLN
INTRAVENOUS | Status: AC
  Filled 2022-12-11 (×2): qty 1000

## 2022-12-11 MED ORDER — POTASSIUM CHLORIDE 10 MEQ/100ML IV SOLN
10.0000 meq | INTRAVENOUS | Status: AC
  Administered 2022-12-11 (×2): 10 meq via INTRAVENOUS
  Filled 2022-12-11 (×2): qty 100

## 2022-12-11 MED ORDER — BOOST / RESOURCE BREEZE PO LIQD CUSTOM: 1.0000 | Freq: Three times a day (TID) | ORAL | Status: DC

## 2022-12-11 MED ORDER — DILTIAZEM HCL ER COATED BEADS 120 MG PO CP24
120.0000 mg | ORAL_CAPSULE | Freq: Every day | ORAL | Status: DC
  Administered 2022-12-11 – 2022-12-12 (×2): 120 mg via ORAL
  Filled 2022-12-11 (×2): qty 1

## 2022-12-11 MED ORDER — PANTOPRAZOLE SODIUM 40 MG IV SOLR
40.0000 mg | Freq: Two times a day (BID) | INTRAVENOUS | Status: DC
  Administered 2022-12-11 – 2022-12-12 (×3): 40 mg via INTRAVENOUS
  Filled 2022-12-11 (×3): qty 10

## 2022-12-11 NOTE — Anesthesia Preprocedure Evaluation (Deleted)
Anesthesia Evaluation    Airway        Dental   Pulmonary neg pulmonary ROS          Cardiovascular hypertension, Pt. on medications +CHF       Neuro/Psych  PSYCHIATRIC DISORDERS      negative neurological ROS     GI/Hepatic Neg liver ROS,,,Hematemesis   Endo/Other  Obesity  Renal/GU negative Renal ROS  negative genitourinary   Musculoskeletal negative musculoskeletal ROS (+)    Abdominal   Peds  Hematology negative hematology ROS (+)   Anesthesia Other Findings   Reproductive/Obstetrics                              Anesthesia Physical Anesthesia Plan  ASA: 3  Anesthesia Plan: MAC   Post-op Pain Management: Minimal or no pain anticipated   Induction: Intravenous  PONV Risk Score and Plan: 4 or greater and Treatment may vary due to age or medical condition and Propofol infusion  Airway Management Planned: Natural Airway and Simple Face Mask  Additional Equipment: None  Intra-op Plan:   Post-operative Plan:   Informed Consent:   Plan Discussed with:   Anesthesia Plan Comments:          Anesthesia Quick Evaluation

## 2022-12-11 NOTE — Assessment & Plan Note (Signed)
Currently appears to be euvolemic we will continue to monitor gently rehydrate and monitor fluid status

## 2022-12-11 NOTE — Progress Notes (Addendum)
Mobility Specialist Progress Note:   12/11/22 1018  Mobility  Activity Ambulated independently in hallway  Level of Assistance Independent  Assistive Device  (IV Pole)  Distance Ambulated (ft) 275 ft  Activity Response Tolerated well  Mobility Referral Yes  $Mobility charge 1 Mobility  Mobility Specialist Start Time (ACUTE ONLY) 0955  Mobility Specialist Stop Time (ACUTE ONLY) 1005  Mobility Specialist Time Calculation (min) (ACUTE ONLY) 10 min    Pre Mobility: 94% SpO2 During Mobility: 138 HR , 93% SpO2 Post Mobility: 97 HR , 97% SpO2  Pt received in bed, agreeable to mobility. Denied any feelings of discomfort or SOB during ambulation. Asymptomatic throughout. Pt returned to bed with call bell in hand and all needs met. Family present.   Leory Plowman  Mobility Specialist Please contact via [employer] office at [phone]

## 2022-12-11 NOTE — TOC Initial Note (Signed)
Transition of Care [facility]) - Initial/Assessment Note    Patient Details  Name: Deanna [name] MRN: [id number] Date of Birth: May 03, 1986  [facility] CM/SW Contact:    Harriet Masson, RN Phone Number: 12/11/2022, 1:09 PM  Clinical Narrative:                 Spoke to patient regarding transition needs.  Patient is agreeable to SA resources. This RNCM gave patient SA resources.  Patient will need MATCH letter at discharge.  Patient can drive herself to apts and has transportation at discharge.   TOC will continue to follow for needs.  Expected Discharge Plan: Home/Self Care Barriers to Discharge: Continued Medical Work up   Patient Goals and CMS Choice Patient states their goals for this hospitalization and ongoing recovery are:: return home          Expected Discharge Plan and Services In-house Referral: Artist Discharge Planning Services: MATCH Program   Living arrangements for the past 2 months: Single Family Home                                      Prior Living Arrangements/Services Living arrangements for the past 2 months: Single Family Home   Patient language and need for interpreter reviewed:: Yes Do you feel safe going back to the place where you live?: Yes      Need for Family Participation in Patient Care: Yes (Comment) Care giver support system in place?: Yes (comment)   Criminal Activity/Legal Involvement Pertinent to Current Situation/Hospitalization: No - Comment as needed  Activities of Daily Living      Permission Sought/Granted                  Emotional Assessment Appearance:: Appears stated age Attitude/Demeanor/Rapport: Gracious, Engaged Affect (typically observed): Accepting Orientation: : Oriented to Place, Oriented to  Time, Oriented to Situation, Oriented to Self Alcohol / Substance Use: Not Applicable Psych Involvement: No (comment)  Admission diagnosis:  Upper GI bleed [K92.2] Hematemesis  with nausea [K92.0] Patient Active Problem List   Diagnosis Date Noted   Alcohol abuse 12/11/2022   Chronic diastolic CHF (congestive heart failure) (HCC) 12/11/2022   Hypokalemia 12/11/2022   Upper GI bleed 12/10/2022   Abnormal cervical Papanicolaou smear 08/09/2020   Irregular periods 08/09/2020   Vaginal discharge 08/09/2020   Psychophysiological insomnia 08/09/2020   Essential hypertension 07/26/2019   PAF (paroxysmal atrial fibrillation) (HCC) 07/26/2019   Exposure to syphilis 11/04/2017   Obesity 10/12/2013   PCP:  Marcine Matar, MD Pharmacy:   [phone] [place], [place] - [address] AT [facility] [address] [place] [address] Phone: [phone] Fax: [phone]  [facility] [address] [place] [address] Phone: [phone] Fax: [phone]  [id number] [place], [place] - [address] [place] [address] Phone: [phone] Fax: [phone]  [facility] - [facility] [address], [address] [place] [address] Phone: [phone] Fax: [phone]     Social Determinants of Health (SDOH) Social History: SDOH Screenings   Depression (PHQ2-9): Low Risk  (05/23/2022)  Tobacco Use: Low Risk  (05/23/2022)   SDOH Interventions:     Readmission Risk Interventions     No data to display

## 2022-12-11 NOTE — Discharge Instructions (Signed)
                  Intensive Outpatient Programs  [facility]    [facility] [address]     [address] [place],  [place]     [place], [place] [phone]      [phone]  [facility]   [facility]  (Inpatient and outpatient)  [phone] (Suboxone and Methadone) [address]           [phone]           [facility]    [facility] - Intensive Outpatient [address]     [address] [place], [place] [address]     [place], [place]  [phone]      [phone]  [facility] (Outpatient, Inpatient, [facility]  [facility] (Groups and [facility]idental) (insurance only) [phone]    [place], [place]          [phone]       [facility]    [facility] (for caregivers and family) [address]     [address] [place], [place]     [place], [place] [phone]      [phone]  Residential Treatment Programs  [facility]  [facility](2 years) Residential: 90 days)  [facility] [address]      [address] [place], [place]     [place], [place] [phone]      [phone] or [phone]  [facility]    [facility] [address]      [address] [place], [place]     [place], [place] [phone]      [phone]  [facility]   [facility] ([facility]) [address]     [address] [place], [place] [address]     [place], [place] [phone]      [phone] Admissions: 8am-3pm M-F  [facility]: Residential Program (90 Days)              [facility]: [facility]  [place], [place]     [place],   [phone] or [phone]    (Walk in Hours over the weekend or by referral)   M[facility]il Crisis: [facility]:[phone] (for crisis  response 24 hours a day) 

## 2022-12-11 NOTE — ED Notes (Signed)
ED TO INPATIENT HANDOFF REPORT  ED Nurse Name and Phone #: [phone]  S Name/Age/Gender Deanna [name] 37 y.o. female Room/Bed: 003C/003C  Code Status   Code Status: Full Code  Home/SNF/Other Home Patient oriented to: self, place, time, and situation Is this baseline? Yes   Triage Complete: Triage complete  Chief Complaint Upper GI bleed [K92.2]  Triage Note Pt reports that this morning she began having gross amount of bright red hematemesis. She's had two more episodes of vomiting and it is now dark red per pt. Hx of alcoholism with 1/5 of liquor every other day. Pt denies hx of esophageal varices. Endorses dizziness and SOB.    Allergies No Known Allergies  Level of Care/Admitting Diagnosis ED Disposition     ED Disposition  Admit   Condition  --   Comment  Hospital Area: [facility] [[id number]]  Level of Care: Progressive [102]  Admit to Progressive based on following criteria: GI, ENDOCRINE disease patients with GI bleeding, acute liver failure or pancreatitis, stable with diabetic ketoacidosis or thyrotoxicosis (hypothyroid) state.  May admit patient to [facility] or [facility] if equivalent level of care is available:: No  Covid Evaluation: Asymptomatic - no recent exposure (last 10 days) testing not required  Diagnosis: Upper GI bleed [147829]  Admitting Physician: Therisa Doyne [[id number]]  Attending Physician: Therisa Doyne [[id number]]  Certification:: I certify this patient will need inpatient services for at least 2 midnights  Estimated Length of Stay: 2          B Medical/Surgery History Past Medical History:  Diagnosis Date   Abnormal Pap smear    Atrial fibrillation (HCC)    Hypertension    Infection    UTI   Normal pregnancy 10/04/2011   Obese    SVD (spontaneous vaginal delivery) 10/04/2011   Trichomonas    Vaginal Pap smear, abnormal    Past Surgical History:  Procedure Laterality Date   LEEP     TOOTH EXTRACTION        A IV Location/Drains/Wounds Patient Lines/Drains/Airways Status     Active Line/Drains/Airways     Name Placement date Placement time Site Days   Peripheral IV 12/10/22 18 G Right Antecubital 12/10/22  1627  Antecubital  1            Intake/Output Last 24 hours  Intake/Output Summary (Last 24 hours) at 12/11/2022 0154 Last data filed at 12/10/2022 2024 Gross per 24 hour  Intake 100 ml  Output --  Net 100 ml    Labs/Imaging Results for orders placed or performed during the hospital encounter of 12/10/22 (from the past 48 hour(s))  Comprehensive metabolic panel     Status: Abnormal   Collection Time: 12/10/22  2:59 PM  Result Value Ref Range   Sodium 139 135 - 145 mmol/L   Potassium 2.7 (LL) 3.5 - 5.1 mmol/L    Comment: CRITICAL RESULT CALLED TO, READ BACK BY AND VERIFIED WITH Jesusita Oka, RN @ 1614 12/10/22 BY SEKDAHL   Chloride 99 98 - 111 mmol/L   CO2 22 22 - 32 mmol/L   Glucose, Bld 110 (H) 70 - 99 mg/dL    Comment: Glucose reference range applies only to samples taken after fasting for at least 8 hours.   BUN <5 (L) 6 - 20 mg/dL   Creatinine, Ser 5.62 0.44 - 1.00 mg/dL   Calcium 9.6 8.9 - 13.0 mg/dL   Total Protein 8.7 (H) 6.5 - 8.1 g/dL   Albumin  4.1 3.5 - 5.0 g/dL   AST 865 (H) 15 - 41 U/L   ALT 48 (H) 0 - 44 U/L   Alkaline Phosphatase 65 38 - 126 U/L   Total Bilirubin 1.6 (H) 0.3 - 1.2 mg/dL   GFR, Estimated >78 >46 mL/min    Comment: (NOTE) Calculated using the CKD-EPI Creatinine Equation (2021)    Anion gap 18 (H) 5 - 15    Comment: Performed at [facility], [address], [place], [place] [address]  CBC     Status: None   Collection Time: 12/10/22  2:59 PM  Result Value Ref Range   WBC 6.4 4.0 - 10.5 K/uL   RBC 4.75 3.87 - 5.11 MIL/uL   Hemoglobin 14.7 12.0 - 15.0 g/dL   HCT 28.4 13.2 - 44.0 %   MCV 90.7 80.0 - 100.0 fL   MCH 30.9 26.0 - 34.0 pg   MCHC 34.1 30.0 - 36.0 g/dL   RDW 10.2 72.5 - 36.6 %   Platelets 209 150 - 400 K/uL    nRBC 0.0 0.0 - 0.2 %    Comment: Performed at [facility], [address] [place], [place] [address]  Type and screen [facility]     Status: None   Collection Time: 12/10/22  2:59 PM  Result Value Ref Range   ABO/RH(D) B POS    Antibody Screen NEG    Sample Expiration      12/13/2022,2359 Performed at [facility], [address], [place], [place] [address]   Protime-INR     Status: None   Collection Time: 12/10/22  4:31 PM  Result Value Ref Range   Prothrombin Time 12.9 11.4 - 15.2 seconds   INR 1.0 0.8 - 1.2    Comment: (NOTE) INR goal varies based on device and disease states. Performed at [facility], [address] [place], [place] [address]   Troponin I (High Sensitivity)     Status: None   Collection Time: 12/10/22  4:31 PM  Result Value Ref Range   Troponin I (High Sensitivity) 5 <18 ng/L    Comment: (NOTE) Elevated high sensitivity troponin I (hsTnI) values and significant  changes across serial measurements may suggest ACS but many other  chronic and acute conditions are known to elevate hsTnI results.  Refer to the "Links" section for chest pain algorithms and additional  guidance. Performed at [facility], [address] [place], [place] [address]   Magnesium     Status: Abnormal   Collection Time: 12/10/22  4:31 PM  Result Value Ref Range   Magnesium 1.4 (L) 1.7 - 2.4 mg/dL    Comment: Performed at [facility], [address], [place], [place] [address]  Troponin I (High Sensitivity)     Status: None   Collection Time: 12/10/22  6:09 PM  Result Value Ref Range   Troponin I (High Sensitivity) 6 <18 ng/L    Comment: (NOTE) Elevated high sensitivity troponin I (hsTnI) values and significant  changes across serial measurements may suggest ACS but many other  chronic and acute conditions are known to elevate hsTnI results.  Refer to the "Links" section for chest pain algorithms and additional   guidance. Performed at [facility], [address], [place], [place] [address]   Ammonia     Status: Abnormal   Collection Time: 12/10/22  7:11 PM  Result Value Ref Range  Ammonia 54 (H) 9 - 35 umol/L    Comment: HEMOLYSIS AT THIS LEVEL MAY AFFECT RESULT Performed at [facility], [address] [place], [place] [address]   CBC     Status: None   Collection Time: 12/10/22 10:52 PM  Result Value Ref Range   WBC 5.0 4.0 - 10.5 K/uL   RBC 4.09 3.87 - 5.11 MIL/uL   Hemoglobin 12.4 12.0 - 15.0 g/dL   HCT 69.6 29.5 - 28.4 %   MCV 90.2 80.0 - 100.0 fL   MCH 30.3 26.0 - 34.0 pg   MCHC 33.6 30.0 - 36.0 g/dL   RDW 13.2 44.0 - 10.2 %   Platelets 162 150 - 400 K/uL   nRBC 0.0 0.0 - 0.2 %    Comment: Performed at [facility], [address], [place], [place] [address]  Basic metabolic panel     Status: Abnormal   Collection Time: 12/10/22 10:52 PM  Result Value Ref Range   Sodium 134 (L) 135 - 145 mmol/L   Potassium 3.2 (L) 3.5 - 5.1 mmol/L   Chloride 98 98 - 111 mmol/L   CO2 24 22 - 32 mmol/L   Glucose, Bld 94 70 - 99 mg/dL    Comment: Glucose reference range applies only to samples taken after fasting for at least 8 hours.   BUN 5 (L) 6 - 20 mg/dL   Creatinine, Ser 6.44 0.44 - 1.00 mg/dL   Calcium 8.7 (L) 8.9 - 10.3 mg/dL   GFR, Estimated >03 >47 mL/min    Comment: (NOTE) Calculated using the CKD-EPI Creatinine Equation (2021)    Anion gap 12 5 - 15    Comment: Performed at [facility], [address] [place], [place] [address]  Phosphorus     Status: None   Collection Time: 12/10/22 11:37 PM  Result Value Ref Range   Phosphorus 3.0 2.5 - 4.6 mg/dL    Comment: Performed at [facility], [address], [place], [place] [address]  CK     Status: None   Collection Time: 12/10/22 11:37 PM  Result Value Ref Range   Total CK 119 38 - 234 U/L    Comment: Performed at [facility], [address], [place], [place] [address]  HIV Antibody  (routine testing w rflx)     Status: None   Collection Time: 12/10/22 11:37 PM  Result Value Ref Range   HIV Screen 4th Generation wRfx Non Reactive Non Reactive    Comment: Performed at [facility], [address], [place], [place] [address]   DG Chest 1 View  Result Date: 12/10/2022 CLINICAL DATA:  Vomiting EXAM: CHEST  1 VIEW COMPARISON:  None Available. FINDINGS: The heart size and mediastinal contours are within normal limits. Both lungs are clear. The visualized skeletal structures are unremarkable. IMPRESSION: No active disease. Electronically Signed   By: Judie Petit.  Shick M.D.   On: 12/10/2022 15:44    Pending Labs Unresulted Labs (From admission, onward)     Start     Ordered   12/11/22 0500  Prealbumin  Tomorrow morning,   R        12/10/22 1910   12/11/22 0500  Magnesium  Tomorrow morning,   R        12/10/22 2111   12/11/22 0500  Phosphorus  Tomorrow morning,   R        12/10/22 2111   12/11/22 0500  Comprehensive metabolic panel  Tomorrow morning,   R       Question:  Release to patient  Answer:  Immediate   12/10/22 2111   12/10/22 2300  CBC  Now then every 6 hours,   R     Question:  Release to patient  Answer:  Immediate   12/10/22 1913   12/10/22 1938  Rapid urine drug screen (hospital performed)  ONCE - STAT,   STAT        12/10/22 1937   12/10/22 1938  Ethanol  Add-on,   AD        12/10/22 1937   12/10/22 1911  TSH  Add-on,   AD        12/10/22 1910   12/10/22 1911  Urinalysis, Complete w Microscopic -Urine, Clean Catch  Once,   URGENT       Question Answer Comment  Release to patient Immediate   Specimen Source Urine, Clean Catch      12/10/22 1910            Vitals/Pain Today's Vitals   12/10/22 2329 12/10/22 2330 12/11/22 0100 12/11/22 0130  BP: 130/78 130/78 (!) 142/83 (!) 140/85  Pulse: (!) 106  (!) 103 96  Resp:      Temp:      TempSrc:      SpO2:  99% 99% 99%  PainSc:        Isolation Precautions No active  isolations  Medications Medications  LORazepam (ATIVAN) tablet 1-4 mg ( Oral See Alternative 12/10/22 1943)    Or  LORazepam (ATIVAN) injection 1-4 mg (2 mg Intravenous Given 12/10/22 1943)  thiamine (VITAMIN B1) tablet 100 mg (100 mg Oral Given 12/10/22 1627)    Or  thiamine (VITAMIN B1) injection 100 mg ( Intravenous See Alternative 12/10/22 1627)  folic acid (FOLVITE) tablet 1 mg (1 mg Oral Given 12/10/22 1627)  multivitamin with minerals tablet 1 tablet (1 tablet Oral Given 12/10/22 1627)  pantoprozole (PROTONIX) 80 mg /NS 100 mL infusion (8 mg/hr Intravenous New Bag/Given 12/10/22 2034)  pantoprazole (PROTONIX) injection 40 mg (has no administration in time range)  0.9 %  sodium chloride infusion ( Intravenous New Bag/Given 12/10/22 2337)  acetaminophen (TYLENOL) tablet 650 mg (has no administration in time range)    Or  acetaminophen (TYLENOL) suppository 650 mg (has no administration in time range)  HYDROcodone-acetaminophen (NORCO/VICODIN) 5-325 MG per tablet 1-2 tablet (has no administration in time range)  ondansetron (ZOFRAN) tablet 4 mg (has no administration in time range)    Or  ondansetron (ZOFRAN) injection 4 mg (has no administration in time range)  potassium chloride 10 mEq in 100 mL IVPB (has no administration in time range)  ondansetron (ZOFRAN-ODT) disintegrating tablet 4 mg (4 mg Oral Given 12/10/22 1501)  lactated ringers bolus 1,000 mL (0 mLs Intravenous Stopped 12/10/22 1813)  pantoprazole (PROTONIX) injection 40 mg (40 mg Intravenous Given 12/10/22 1627)  potassium chloride 10 mEq in 100 mL IVPB (0 mEq Intravenous Stopped 12/10/22 2144)  magnesium sulfate IVPB 2 g 50 mL (0 g Intravenous Stopped 12/10/22 1922)  pantoprazole (PROTONIX) injection 40 mg (40 mg Intravenous Given 12/10/22 1936)    Mobility walks     Focused Assessments    R Recommendations: See Admitting Provider Note  Report given to:   Additional Notes: a/ox4, ambulatory, continent x2

## 2022-12-11 NOTE — Progress Notes (Addendum)
PROGRESS NOTE  HOLLIN CREWE VWU:[id number] DOB: [birth year] DOA: 12/10/2022 PCP: Marcine Matar, MD  HPI/Recap of past 1 hours: 37 year old female with history of paroxysmal A-fib on p.o. diltiazem, not anticoagulated, essential hypertension, obesity, alcohol abuse, chronic HFpEF, who presented with hematemesis on the day of her presentation.  She drinks 1/5 of liquor a day and last alcohol beverage was the day prior to her presentation.  Endorses use of NSAIDs, Goody powder.  Upon admission the patient was noted to have acute blood loss anemia with hemoglobin from 14K to 12K.  The patient was placed on Protonix drip and GI was consulted.  No recurrent GI bleed reported.  Possible EGD on 12/12/2022.  12/12/2022: The patient was seen and examined at bedside.  Her mother was at bedside.  Denies any abdominal pain.  No nausea this morning.  Clear liquid diet ordered as recommended by GI and n.p.o. after midnight.  Assessment/Plan: Principal Problem:   Upper GI bleed Active Problems:   Obesity   Essential hypertension   PAF (paroxysmal atrial fibrillation) (HCC)   Alcohol abuse   Chronic diastolic CHF (congestive heart failure) (HCC)   Hypokalemia  Hematemesis Upper GI bleed in the setting of alcohol abuse and NSAIDs use Rule out Mallory Weiss tear, esophagitis, alcoholic gastritis, PUD Currently hemodynamically stable and hemoglobin is stable No recurrent GI bleed reported Continue Protonix drip Continue to monitor H&H Possible EGD on 12/12/2022. The patient was advised to completely avoid alcohol and NSAIDs use Appreciate GI assistance  Acute blood loss anemia in the setting of upper GI bleed Hemoglobin stable at 12 from 14 Repeat H&H x 1 No reported recurrent overt GI bleed  Elevated liver chemistries suspect from alcohol abuse Follow right upper quadrant ultrasound Trend LFTs Avoid hepatotoxic agents  Alcohol abuse Consumes at least 1/5 of liquor per day Last alcohol  intake was the day prior to presentation Continue CIWA protocol  Hypokalemia Serum potassium 3.2 Repeated intravenously LR KCl 40 mill equivalent at 50 cc/h x 1 day. Repeat BMP in the morning  Elevated TSH Suspect subclinical hypothyroidism TSH 8.540 Obtain free T4  Paroxysmal atrial fibrillation, currently not anticoagulated Resume home Cardizem at lowest dose to avoid hypotension due to recent GI bleed Monitor on telemetry  Essential hypertension BP is not at goal, elevated Resume home Cardizem stated above Closely monitor vital signs Avoid hypotension in the setting of recent upper GI bleed  Obesity BMI 38 Recommend weight loss outpatient with regular physical activity and healthy dieting.   Time: 50 minutes.   Code Status: Full code.  Family Communication: Updated her mother at bedside.  Disposition Plan: Admitted to progressive care unit.  Will discharge to home once GI signs off.   Consultants: GI  Procedures: None.  Antimicrobials: None.  DVT prophylaxis: SCDs  Status is: Inpatient The patient requires at least 2 midnights for further evaluation and treatment of present culture.    Objective: Vitals:   12/11/22 0228 12/11/22 0257 12/11/22 0300 12/11/22 0737  BP: 136/86  (!) 164/98 (!) 161/99  Pulse: (!) 115  (!) 106 (!) 109  Resp: (!) 22  (!) 22 (!) 28  Temp: 97.8 F (36.6 C)  98.5 F (36.9 C) 98.6 F (37 C)  TempSrc: Oral   Oral  SpO2: 100%  98% 97%  Weight:  105.7 kg    Height:  5\' 5"  (1.651 m)      Intake/Output Summary (Last 24 hours) at 12/11/2022 1117 Last data filed at 12/11/2022  0800 Gross per 24 hour  Intake 188.64 ml  Output 250 ml  Net -61.36 ml   Filed Weights   12/11/22 0257  Weight: 105.7 kg    Exam:  General: 37 y.o. year-old female well developed well nourished in no acute distress.  Alert and oriented x3. Cardiovascular: Regular rate and rhythm with no rubs or gallops.  No thyromegaly or JVD noted.    Respiratory: Clear to auscultation with no wheezes or rales. Good inspiratory effort. Abdomen: Soft nontender nondistended with normal bowel sounds x4 quadrants. Musculoskeletal: No lower extremity edema. 2/4 pulses in all 4 extremities. Skin: No ulcerative lesions noted or rashes, Psychiatry: Mood is appropriate for condition and setting   Data Reviewed: CBC: Recent Labs  Lab 12/10/22 1459 12/10/22 2252 12/11/22 0116  WBC 6.4 5.0 4.1  HGB 14.7 12.4 12.0  HCT 43.1 36.9 36.5  MCV 90.7 90.2 91.5  PLT 209 162 164   Basic Metabolic Panel: Recent Labs  Lab 12/10/22 1459 12/10/22 1631 12/10/22 2252 12/10/22 2337 12/11/22 0116  NA 139  --  134*  --  135  K 2.7*  --  3.2*  --  3.2*  CL 99  --  98  --  99  CO2 22  --  24  --  24  GLUCOSE 110*  --  94  --  88  BUN <5*  --  5*  --  <5*  CREATININE 0.68  --  0.63  --  0.64  CALCIUM 9.6  --  8.7*  --  8.5*  MG  --  1.4*  --   --  2.0  PHOS  --   --   --  3.0 2.8   GFR: Estimated Creatinine Clearance: 118.5 mL/min (by C-G formula based on SCr of 0.64 mg/dL). Liver Function Tests: Recent Labs  Lab 12/10/22 1459 12/11/22 0116  AST 147* 78*  ALT 48* 34  ALKPHOS 65 48  BILITOT 1.6* 1.9*  PROT 8.7* 6.6  ALBUMIN 4.1 3.1*   No results for input(s): "LIPASE", "AMYLASE" in the last 168 hours. Recent Labs  Lab 12/10/22 1911  AMMONIA 54*   Coagulation Profile: Recent Labs  Lab 12/10/22 1631  INR 1.0   Cardiac Enzymes: Recent Labs  Lab 12/10/22 2337  CKTOTAL 119   BNP (last 3 results) No results for input(s): "PROBNP" in the last 8760 hours. HbA1C: No results for input(s): "HGBA1C" in the last 72 hours. CBG: No results for input(s): "GLUCAP" in the last 168 hours. Lipid Profile: No results for input(s): "CHOL", "HDL", "LDLCALC", "TRIG", "CHOLHDL", "LDLDIRECT" in the last 72 hours. Thyroid Function Tests: Recent Labs    12/11/22 0116  TSH 8.540*   Anemia Panel: No results for input(s): "VITAMINB12",  "FOLATE", "FERRITIN", "TIBC", "IRON", "RETICCTPCT" in the last 72 hours. Urine analysis:    Component Value Date/Time   COLORURINE Talicia (A) 12/10/2022 1938   APPEARANCEUR HAZY (A) 12/10/2022 1938   LABSPEC 1.020 12/10/2022 1938   PHURINE 7.0 12/10/2022 1938   GLUCOSEU NEGATIVE 12/10/2022 1938   HGBUR NEGATIVE 12/10/2022 1938   BILIRUBINUR SMALL (A) 12/10/2022 1938   KETONESUR 5 (A) 12/10/2022 1938   PROTEINUR NEGATIVE 12/10/2022 1938   UROBILINOGEN >=8.0 09/04/2021 1816   NITRITE NEGATIVE 12/10/2022 1938   LEUKOCYTESUR NEGATIVE 12/10/2022 1938   Sepsis Labs: @LABRCNTIP (procalcitonin:4,lacticidven:4)  ) Recent Results (from the past 240 hour(s))  MRSA Next Gen by PCR, Nasal     Status: None   Collection Time: 12/11/22  2:58 AM  Specimen: Nasal Mucosa; Nasal Swab  Result Value Ref Range Status   MRSA by PCR Next Gen NOT DETECTED NOT DETECTED Final    Comment: (NOTE) The GeneXpert MRSA Assay (FDA approved for NASAL specimens only), is one component of a comprehensive MRSA colonization surveillance program. It is not intended to diagnose MRSA infection nor to guide or monitor treatment for MRSA infections. Test performance is not FDA approved in patients less than 71 years old. Performed at [facility], [address], [place], [place] [address]       Studies: DG Chest 1 View  Result Date: 12/10/2022 CLINICAL DATA:  Vomiting EXAM: CHEST  1 VIEW COMPARISON:  None Available. FINDINGS: The heart size and mediastinal contours are within normal limits. Both lungs are clear. The visualized skeletal structures are unremarkable. IMPRESSION: No active disease. Electronically Signed   By: Judie Petit.  Shick M.D.   On: 12/10/2022 15:44    Scheduled Meds:  feeding supplement  1 Container Oral TID BM   folic acid  1 mg Oral Daily   multivitamin with minerals  1 tablet Oral Daily   pantoprazole (PROTONIX) IV  40 mg Intravenous Q12H   thiamine  100 mg Oral Daily   Or   thiamine  100  mg Intravenous Daily    Continuous Infusions:  potassium chloride 10 mEq (12/11/22 1026)     LOS: 1 day     Darlin Drop, MD [facility] Pager [phone]  If 7PM-7AM, please contact night-coverage [email or web address] Password [facility] 12/11/2022, 11:17 AM

## 2022-12-11 NOTE — Progress Notes (Signed)
Initial Nutrition Assessment  DOCUMENTATION CODES:   Obesity unspecified  INTERVENTION:   - Boost Breeze po TID, each supplement provides 250 kcal and 9 grams of protein  - Continue MVI with minerals, folic acid, and thiamine  NUTRITION DIAGNOSIS:   Inadequate oral intake related to altered GI function as evidenced by other (clear liquid diet).  GOAL:   Patient will meet greater than or equal to 90% of their needs  MONITOR:   PO intake, Supplement acceptance, Diet advancement, Labs  REASON FOR ASSESSMENT:   Consult Assessment of nutrition requirement/status  ASSESSMENT:   37 year old female who presented on 7/17 after having large volume hematemesis. PMH of EtOH abuse (fifth of liquor every other day), atrial fibrillation, HTN. Pt admitted with upper GI bleed.  Noted tentative plan for EGD tomorrow. Pt on clear liquids today and NPO at midnight.  Spoke with pt at bedside. She reports feeling very hungry and wishes she could have something to eat. Explaining reasoning for clear liquid diet at this time and provided a Boost Breeze. Pt willing to drink these today.  Pt states that she typically has a good appetite and eats 3 meals daily. Breakfast is a full meal, but lunch and dinner are smaller. Pt isn't as hungry for these meals because she is drinking EtOH.  Pt shares that her weight fluctuates but that she typically weighs around 230 lbs. Current weight is 233 lbs (105.7 kg). Pt denies any recent weight gain or loss.  Discussed importance of maximizing protein intake with supplements while on a clear liquid diet. Pt expresses understanding.  Medications reviewed and include: folic acid, MVI with minerals, thiamine, protonix gtt, IV KCl 10 mEq x 4  Labs reviewed: potassium 3.2  NUTRITION - FOCUSED PHYSICAL EXAM:  Flowsheet Row Most Recent Value  Orbital Region No depletion  Upper Arm Region No depletion  Thoracic and Lumbar Region No depletion  Buccal Region No  depletion  Temple Region No depletion  Clavicle Bone Region No depletion  Clavicle and Acromion Bone Region No depletion  Scapular Bone Region No depletion  Dorsal Hand No depletion  Patellar Region No depletion  Anterior Thigh Region No depletion  Posterior Calf Region No depletion  Edema (RD Assessment) None  Hair Reviewed  Eyes Reviewed  Mouth Reviewed  Skin Reviewed  Nails Reviewed       Diet Order:   Diet Order             Diet clear liquid Room service appropriate? Yes; Fluid consistency: Thin  Diet effective now                   EDUCATION NEEDS:   Education needs have been addressed  Skin:  Skin Assessment: Reviewed RN Assessment  Last BM:  no documented BM  Height:   Ht Readings from Last 1 Encounters:  12/11/22 5\' 5"  (1.651 m)    Weight:   Wt Readings from Last 1 Encounters:  12/11/22 105.7 kg    BMI:  Body mass index is 38.78 kg/m.  Estimated Nutritional Needs:   Kcal:  1800-2000  Protein:  90-105 grams  Fluid:  1.8-2.0 L    Mertie Clause, MS, RD, LDN Inpatient Clinical Dietitian Please see AMiON for contact information.

## 2022-12-11 NOTE — Progress Notes (Signed)
Nurse requested Mobility Specialist to perform oxygen saturation test with pt which includes removing pt from oxygen both at rest and while ambulating.  Below are the results from that testing.     Patient Saturations on Room Air at Rest = spO2 94%  Patient Saturations on Room Air while Ambulating = sp02 98% .  Rested and performed pursed lip breathing for 1 minute with sp02 at 93%.  Reported results to nurse.

## 2022-12-11 NOTE — Assessment & Plan Note (Signed)
Will order CIWA protocol spoke at length with patient regarding need to quit.  Patient states understanding

## 2022-12-11 NOTE — Evaluation (Signed)
Clinical/Bedside Swallow Evaluation Patient Details  Name: Deanna [name] MRN: [id number] Date of Birth: Jan 12, 1986  Today's Date: 12/11/2022 Time: SLP Start Time (ACUTE ONLY): 0912 SLP Stop Time (ACUTE ONLY): 0920 SLP Time Calculation (min) (ACUTE ONLY): 8 min  Past Medical History:  Past Medical History:  Diagnosis Date   Abnormal Pap smear    Atrial fibrillation (HCC)    Hypertension    Infection    UTI   Normal pregnancy 10/04/2011   Obese    SVD (spontaneous vaginal delivery) 10/04/2011   Trichomonas    Vaginal Pap smear, abnormal    Past Surgical History:  Past Surgical History:  Procedure Laterality Date   LEEP     TOOTH EXTRACTION     HPI:  Pt is a 37 yo female presenting with hematemesis. PMH includes: alcohol abuse, HTN, afib, obesity    Assessment / Plan / Recommendation  Clinical Impression  Pt was seen this morning during medication administration. Per chart she was to be NPO after midnight pending possible GI intervention, but order is currently for clear liquid diet and RN says she is able to have POs. Pt denies any symptoms of dysphagia now or in the past. Oral motor exam is [facility]. Pt took multiple pills at once, followed with sips of thin liquids via straw with no overt signs of dysphagia or aspiration. Recommend that she continue on current diet, advancing up to regular solids as tolerated once cleared by MD. No further SLP f/u indicated at this time, although would advise staff to continue to monitor while she is on CIWA. Please reconsult with any acute changes. SLP Visit Diagnosis: Dysphagia, unspecified (R13.10)    Aspiration Risk  No limitations    Diet Recommendation Regular;Thin liquid (once medically cleared to advance to solids)    Liquid Administration via: Cup;Straw Medication Administration: Whole meds with liquid Supervision: Patient able to self feed Postural Changes: Seated upright at 90 degrees    Other  Recommendations Oral Care  Recommendations: Oral care BID    Recommendations for follow up therapy are one component of a multi-disciplinary discharge planning process, led by the attending physician.  Recommendations may be updated based on patient status, additional functional criteria and insurance authorization.  Follow up Recommendations No SLP follow up      Assistance Recommended at Discharge    Functional Status Assessment Patient has not had a recent decline in their functional status  Frequency and Duration            Prognosis        Swallow Study   General HPI: Pt is a 38 yo female presenting with hematemesis. PMH includes: alcohol abuse, HTN, afib, obesity Type of Study: Bedside Swallow Evaluation Previous Swallow Assessment: none in chart Diet Prior to this Study: Clear liquid diet Temperature Spikes Noted: No Respiratory Status: Room air History of Recent Intubation: No Behavior/Cognition: Alert;Cooperative;Pleasant mood Oral Cavity Assessment: Within Functional Limits Oral Care Completed by SLP: No Oral Cavity - Dentition: Adequate natural dentition Vision: Functional for self-feeding Self-Feeding Abilities: Able to feed self Patient Positioning: Upright in bed Baseline Vocal Quality: Normal Volitional Cough: Strong Volitional Swallow: Able to elicit    Oral/Motor/Sensory Function Overall Oral Motor/Sensory Function: Within functional limits   Ice Chips Ice chips: Not tested   Thin Liquid Thin Liquid: Within functional limits Presentation: Self Fed;Straw (and pills)    Nectar Thick Nectar Thick Liquid: Not tested   Honey Thick Honey Thick Liquid: Not tested   Puree  Puree: Not tested   Solid     Solid: Not tested      Mahala Menghini., M.A. CCC-SLP Acute Rehabilitation Services Office [phone]  Secure chat preferred  12/11/2022,9:28 AM

## 2022-12-11 NOTE — Assessment & Plan Note (Signed)
-   Glasgow Blatchford score    HR >100  CHF   >1 Justifies admission and aggressive management      Modifying risk factors include:    NSAIDS use alcohol abuse   liver disease     Admit to progressive given above    -  ER  Provider spoke to gastroenterology ( [facility]) they will see patient in a.m. appreciate their consult   - serial CBC.    - Monitor for any recurrence,  evidence of hemodynamic instability or significant blood loss  - Transfuse as needed for hemoglobin below 7 or evidence of life-threatening bleeding  - Establish at least 2 PIV and fluid resuscitate   - clear liquids for tonight keep nothing by mouth post midnight,   -  administer Protonix drip Mallory-Weiss tear suspected currently hematemesis has resolved

## 2022-12-11 NOTE — Assessment & Plan Note (Signed)
Hypokalemia and hypomagnesemia will replete and recheck

## 2022-12-11 NOTE — Assessment & Plan Note (Signed)
Not on anticoagulation Hold cardizem

## 2022-12-11 NOTE — Assessment & Plan Note (Signed)
Chronic stable will need to have follow-up as an outpatient

## 2022-12-11 NOTE — Plan of Care (Signed)
Discussed with patient plan of care for the evening, pain management and using the bathroom with some teach back displayed.  What is important to the patient is a full explanation of procedure tomorrow.  Doesn't want to sign EGD informed consent at this time.  Verified in bed side report with other nurse.  Will keep NPO tonight just in case.  Problem: Education: Goal: Knowledge of General Education information will improve Description: Including pain rating scale, medication(s)/side effects and non-pharmacologic comfort measures Outcome: Progressing   Problem: Health Behavior/Discharge Planning: Goal: Ability to manage health-related needs will improve Outcome: Progressing

## 2022-12-11 NOTE — Consult Note (Addendum)
Consultation Note   Referring Provider:  [facility] PCP: Marcine Matar, MD Primary Gastroenterologist: Gentry Fitz        Reason for consultation: Hematemesis  DOA: 12/10/2022         Hospital Day: 2   Attending physician's note  I have taken a history, reviewed the chart and examined the patient. I performed a substantive portion of this encounter, including complete performance of at least one of the key components, in conjunction with the APP. I agree with the APP's note, impression and recommendations.   37 yr F with ETOH abuse admitted with hematemesis after she was binge drinking. She had episodes of hematemesis in the ER Likely etiology Mallory-Weiss tear or erosive esophagitis.  Peptic ulcer disease also in the differential She is hemodynamically stable with no significant drop in hemoglobin  Continue PPI twice daily Monitor hemoglobin N.p.o. after midnight  Will tentatively plan for EGD tomorrow but if hemoglobin remains stable and she is tolerating diet, can defer EGD Discussed alcohol cessation   The patient was provided an opportunity to ask questions and all were answered. The patient agreed with the plan and demonstrated an understanding of the instructions.  Iona Beard , MD [phone]     Assessment and Plan   37 year old female with a history of EtOH abuse admitted with hematemesis. Hemoglobin overall fine at 12 (down from 14.7) considering she has had IV fluids.  Rule out Mallory-Weiss tear, esophagitis, EtOH gastritis, PUD.  -Hemodynamically stable. Hgb stable at 12. No active bleeding, last emesis was yesterday -BID Pantoprazole infusion -Trend hemoglobin -Clear liquids today, NPO after MN -Scheduled tentatively for EGD to be done tomorrow. May not need to be done if not further bleeding. The risks and benefits of EGD with possible biopsies were discussed with the patient who agrees to proceed.     EtOH abuse.  Consumes at least 1/5 of liquor a day, last drink was yesterday -On CIWA protocol  Elevated liver chemistries ( improving).  Probably related to Etoh. Transaminase pattern of elevation consistent with EtOH.  T. bili 1.9.  --Discussed need to reduce Etoh intake ( significantly). Told her about liver tests abnormalites -RUQ [place] -Can continued workup outpatient -AM LFTs  Hypokalemia, improving K+ 2.7 >> 3.2  A-fib, rate controlled on Diltiazem. Not anticoagulated.   See PMH for additional medical history     Pertinent GI History:  none   HISTORY OF PRESENT ILLNESS  Patient is a 37 y.o. year old female with a past medical history of A-fib, HTN, obesity, Etoh abuse, chronic diastolic CHF  Deanna [name] presented to ED yesterday wiith hematemesis. Actively drinking Etoh ( a fifth of liquor a day). Deanna [name] was feeling fine until yesterday when she developed acute N/V. First episode consisted of bright red blood ( small volume). About 30 minutes later had episode of coffee ground emesis followed by another episode. No bleeding since. No abdominal pain.Deanna [name] one Goody powder last week but that is only time she has taken one. No other NSAIDs.  Weight stable. No chronic GI problems.   No FMH of gastrointestinal malignancies   Workup notable for  Hemoglobin of 14.7, 12 today Normal platelet count Potassium 2.7, improved to 3.2 Normal BUN and  creatinine AST 147/ALT 48 / total bili 1.6, normal alkaline phosphatase.  Today AST 78, ALT normal, total bilirubin 1.9 INR 1.9 EtOH level less than 10 UDS positive for only benzodiazepines Low magnesium of 1.4, improved to 2.0 today Elevated TSH of 8.5 Heart rate 109, BP elevated at 161/99, normal O2 saturation    Labs and Imaging: Recent Labs    12/10/22 1459 12/10/22 2252 12/11/22 0116  WBC 6.4 5.0 4.1  HGB 14.7 12.4 12.0  HCT 43.1 36.9 36.5  PLT 209 162 164   Recent Labs    12/10/22 1459 12/10/22 2252 12/11/22 0116   NA 139 134* 135  K 2.7* 3.2* 3.2*  CL 99 98 99  CO2 22 24 24   GLUCOSE 110* 94 88  BUN <5* 5* <5*  CREATININE 0.68 0.63 0.64  CALCIUM 9.6 8.7* 8.5*   Recent Labs    12/11/22 0116  PROT 6.6  ALBUMIN 3.1*  AST 78*  ALT 34  ALKPHOS 48  BILITOT 1.9*   No results for input(s): "HEPBSAG", "HCVAB", "HEPAIGM", "HEPBIGM" in the last 72 hours. Recent Labs    12/10/22 1631  LABPROT 12.9  INR 1.0    Previous GI Evaluation:      Principal Problem:   Upper GI bleed Active Problems:   Obesity   Essential hypertension   PAF (paroxysmal atrial fibrillation) (HCC)   Alcohol abuse   Chronic diastolic CHF (congestive heart failure) (HCC)   Hypokalemia     Past Medical History:  Diagnosis Date   Abnormal Pap smear    Atrial fibrillation (HCC)    Hypertension    Infection    UTI   Normal pregnancy 10/04/2011   Obese    SVD (spontaneous vaginal delivery) 10/04/2011   Trichomonas    Vaginal Pap smear, abnormal     Past Surgical History:  Procedure Laterality Date   LEEP     TOOTH EXTRACTION      Family History  Problem Relation Age of Onset   Diabetes Maternal Grandfather    Hypertension Mother    Diabetes Mother    Hypertension Father    Diabetes Sister    Hypertension Sister    Sarcoidosis Brother    Anesthesia problems Neg Hx    Hypotension Neg Hx    Malignant hyperthermia Neg Hx    Pseudochol deficiency Neg Hx     Prior to Admission medications   Medication Sig Start Date End Date Taking? Authorizing Provider  diltiazem (CARTIA XT) 180 MG 24 hr capsule Take 1 capsule (180 mg total) by mouth daily. 05/23/22  Yes Marcine Matar, MD  hydrochlorothiazide (MICROZIDE) 12.5 MG capsule Take 1 capsule (12.5 mg total) by mouth daily. 05/23/22  Yes Marcine Matar, MD  medroxyPROGESTERone (DEPO-PROVERA) 150 MG/ML injection Inject 150 mg into the muscle every 3 (three) months.   Yes [provider]    Current Facility-Administered Medications   Medication Dose Route Frequency Provider Last Rate Last Admin   acetaminophen (TYLENOL) tablet 650 mg  650 mg Oral Q6H PRN Doutova, Anastassia, MD       Or   acetaminophen (TYLENOL) suppository 650 mg  650 mg Rectal Q6H PRN Doutova, Anastassia, MD       folic acid (FOLVITE) tablet 1 mg  1 mg Oral Daily Doutova, Anastassia, MD   1 mg at 12/11/22 0914   HYDROcodone-acetaminophen (NORCO/VICODIN) 5-325 MG per tablet 1-2 tablet  1-2 tablet Oral Q4H PRN Therisa Doyne, MD  LORazepam (ATIVAN) tablet 1-4 mg  1-4 mg Oral Q1H PRN Therisa Doyne, MD       Or   LORazepam (ATIVAN) injection 1-4 mg  1-4 mg Intravenous Q1H PRN Therisa Doyne, MD   2 mg at 12/10/22 1943   multivitamin with minerals tablet 1 tablet  1 tablet Oral Daily Therisa Doyne, MD   1 tablet at 12/11/22 0914   ondansetron (ZOFRAN) tablet 4 mg  4 mg Oral Q6H PRN Therisa Doyne, MD       Or   ondansetron (ZOFRAN) injection 4 mg  4 mg Intravenous Q6H PRN Therisa Doyne, MD       [START ON 12/14/2022] pantoprazole (PROTONIX) injection 40 mg  40 mg Intravenous Q12H Doutova, Anastassia, MD       pantoprozole (PROTONIX) 80 mg /NS 100 mL infusion  8 mg/hr Intravenous Continuous Doutova, Anastassia, MD 10 mL/hr at 12/11/22 0719 8 mg/hr at 12/11/22 0719   thiamine (VITAMIN B1) tablet 100 mg  100 mg Oral Daily Therisa Doyne, MD   100 mg at 12/11/22 4098   Or   thiamine (VITAMIN B1) injection 100 mg  100 mg Intravenous Daily Therisa Doyne, MD        Allergies as of 12/10/2022   (No Known Allergies)    Social History   Socioeconomic History   Marital status: Married    Spouse name: Not on file   Number of children: Not on file   Years of education: Not on file   Highest education level: Not on file  Occupational History   Not on file  Tobacco Use   Smoking status: Never   Smokeless tobacco: Never  Vaping Use   Vaping status: Never Used  Substance and Sexual Activity   Alcohol use: Yes    Drug use: No   Sexual activity: Yes    Birth control/protection: Injection  Other Topics Concern   Not on file  Social History Narrative   Lives with husband, son, and daughter   Gets to appointments in her car   No advanced directive    Sister and husband may make medical decisions if unable    No exercise   Social Determinants of Health   Financial Resource Strain: Not on file  Food Insecurity: Not on file  Transportation Needs: Not on file  Physical Activity: Not on file  Stress: Not on file  Social Connections: Not on file  Intimate Partner Violence: Not on file     Code Status   Code Status: Full Code  Review of Systems: All systems reviewed and negative except where noted in HPI.  Physical Exam: Vital signs in last 24 hours: Temp:  [97.8 F (36.6 C)-98.6 F (37 C)] 98.6 F (37 C) (07/18 0737) Pulse Rate:  [92-131] 109 (07/18 0737) Resp:  [12-28] 28 (07/18 0737) BP: (130-171)/(78-110) 161/99 (07/18 0737) SpO2:  [97 %-100 %] 97 % (07/18 0737) Weight:  [105.7 kg] 105.7 kg (07/18 0257)    General:  Pleasant female in NAD Psych:  Cooperative. Normal mood and affect Eyes: Pupils equal Ears:  Normal auditory acuity Nose: No deformity, discharge or lesions Neck:  Supple, no masses felt Lungs:  Clear to auscultation.  Heart:  Regular rate, regular rhythm.  Abdomen:  Soft, nondistended, nontender, active bowel sounds, no masses felt Rectal :  Deferred Msk: Symmetrical without gross deformities.  Neurologic:  Alert, oriented, grossly normal neurologically Extremities : No edema Skin:  Intact without significant lesions.    Intake/Output from previous  day: 07/17 0701 - 07/18 0700 In: 100 [IV Piggyback:100] Out: 250 [Urine:250] Intake/Output this shift:  No intake/output data recorded.     Willette Cluster, NP-C @  12/11/2022, 9:24 AM

## 2022-12-11 NOTE — Progress Notes (Signed)
Complained of  sharp chest pain  scale 6/10 , EKG done with no changes from previous . Bp-123/63,  hr 75, r-20  98 %  room air, nitroglycerin SL given x2 without relief. Bp dropped to 90's-80's asymptomatic. Cardiology PA made aware and seen patient at once. Claimed to discuss with MD. Continue to monitor.

## 2022-12-11 NOTE — Assessment & Plan Note (Signed)
Allow permissive hypertension for tonight 

## 2022-12-12 DIAGNOSIS — F1090 Alcohol use, unspecified, uncomplicated: Secondary | ICD-10-CM

## 2022-12-12 LAB — MAGNESIUM: Magnesium: 1.8 mg/dL (ref 1.7–2.4)

## 2022-12-12 LAB — COMPREHENSIVE METABOLIC PANEL
ALT: 31 U/L (ref 0–44)
AST: 84 U/L — ABNORMAL HIGH (ref 15–41)
Albumin: 3.1 g/dL — ABNORMAL LOW (ref 3.5–5.0)
Alkaline Phosphatase: 44 U/L (ref 38–126)
Anion gap: 9 (ref 5–15)
BUN: 5 mg/dL — ABNORMAL LOW (ref 6–20)
CO2: 22 mmol/L (ref 22–32)
Calcium: 8.6 mg/dL — ABNORMAL LOW (ref 8.9–10.3)
Chloride: 103 mmol/L (ref 98–111)
Creatinine, Ser: 0.66 mg/dL (ref 0.44–1.00)
GFR, Estimated: >60 mL/min (ref >60)
Glucose, Bld: 90 mg/dL (ref 70–99)
Potassium: 3.6 mmol/L (ref 3.5–5.1)
Sodium: 134 mmol/L — ABNORMAL LOW (ref 135–145)
Total Bilirubin: 1.8 mg/dL — ABNORMAL HIGH (ref 0.3–1.2)
Total Protein: 6.3 g/dL — ABNORMAL LOW (ref 6.5–8.1)

## 2022-12-12 LAB — CBC
HCT: 33.9 % — ABNORMAL LOW (ref 36.0–46.0)
Hemoglobin: 11.3 g/dL — ABNORMAL LOW (ref 12.0–15.0)
MCH: 30.3 pg (ref 26.0–34.0)
MCHC: 33.3 g/dL (ref 30.0–36.0)
MCV: 90.9 fL (ref 80.0–100.0)
Platelets: 143 10*3/uL — ABNORMAL LOW (ref 150–400)
RBC: 3.73 MIL/uL — ABNORMAL LOW (ref 3.87–5.11)
RDW: 13.5 % (ref 11.5–15.5)
WBC: 4.6 10*3/uL (ref 4.0–10.5)
nRBC: 0 % (ref 0.0–0.2)

## 2022-12-12 LAB — PHOSPHORUS: Phosphorus: 2.4 mg/dL — ABNORMAL LOW (ref 2.5–4.6)

## 2022-12-12 SURGERY — ESOPHAGOGASTRODUODENOSCOPY (EGD) WITH PROPOFOL
Anesthesia: Monitor Anesthesia Care

## 2022-12-12 MED ORDER — ADULT MULTIVITAMIN W/MINERALS CH: 1.0000 | ORAL_TABLET | Freq: Every day | ORAL | 0 refills | Status: AC

## 2022-12-12 MED ORDER — PANTOPRAZOLE SODIUM 40 MG PO TBEC: 40.0000 mg | DELAYED_RELEASE_TABLET | Freq: Two times a day (BID) | ORAL | 0 refills | Status: DC

## 2022-12-12 MED ORDER — PANTOPRAZOLE SODIUM 40 MG PO TBEC: 40.0000 mg | DELAYED_RELEASE_TABLET | Freq: Two times a day (BID) | ORAL | Status: DC

## 2022-12-12 MED ORDER — PANTOPRAZOLE SODIUM 40 MG PO TBEC: 40.0000 mg | DELAYED_RELEASE_TABLET | Freq: Every day | ORAL | 0 refills | Status: DC

## 2022-12-12 MED ORDER — VITAMIN B-1 100 MG PO TABS: 100.0000 mg | ORAL_TABLET | Freq: Every day | ORAL | 0 refills | Status: AC

## 2022-12-12 MED ORDER — FOLIC ACID 1 MG PO TABS: 1.0000 mg | ORAL_TABLET | Freq: Every day | ORAL | 0 refills | Status: AC

## 2022-12-12 NOTE — Plan of Care (Signed)
  Problem: Health Behavior/Discharge Planning: Goal: Ability to manage health-related needs will improve Outcome: Progressing   Problem: Activity: Goal: Risk for activity intolerance will decrease Outcome: Progressing   Problem: Nutrition: Goal: Adequate nutrition will be maintained Outcome: Progressing   Problem: Coping: Goal: Level of anxiety will decrease Outcome: Progressing   Problem: Elimination: Goal: Will not experience complications related to bowel motility Outcome: Progressing Goal: Will not experience complications related to urinary retention Outcome: Progressing   Problem: Pain Managment: Goal: General experience of comfort will improve Outcome: Progressing   

## 2022-12-12 NOTE — Progress Notes (Signed)
[facility] ROUNDING NOTE   Subjective: Feels better, no further vomiting or hematemesis. She had normal BM this morning with no melena   Objective: Vital signs in last 24 hours: Temp:  [98.1 F (36.7 C)-98.7 F (37.1 C)] 98.6 F (37 C) (07/19 0727) Pulse Rate:  [68-97] 69 (07/19 0727) Resp:  [14-21] 16 (07/19 0727) BP: (140-160)/(94-106) 155/101 (07/19 0727) SpO2:  [95 %-99 %] 96 % (07/19 0727) Last BM Date : 12/11/22 General: NAD Abdomen: soft, non tender, no distension Ext: no edema    Intake/Output from previous day: 07/18 0701 - 07/19 0700 In: 1698.8 [P.O.:830; I.V.:868.8] Out: 0  Intake/Output this shift: No intake/output data recorded.   Lab Results: Recent Labs    12/11/22 1122 12/11/22 1746 12/12/22 0027  WBC 4.5 4.5 4.6  HGB 11.8* 12.6 11.3*  PLT 159 156 143*  MCV 91.9 91.9 90.9   BMET Recent Labs    12/10/22 2252 12/11/22 0116 12/12/22 0027  NA 134* 135 134*  K 3.2* 3.2* 3.6  CL 98 99 103  CO2 24 24 22   GLUCOSE 94 88 90  BUN 5* <5* 5*  CREATININE 0.63 0.64 0.66  CALCIUM 8.7* 8.5* 8.6*   LFT Recent Labs    12/10/22 1459 12/11/22 0116 12/12/22 0027  PROT 8.7* 6.6 6.3*  ALBUMIN 4.1 3.1* 3.1*  AST 147* 78* 84*  ALT 48* 34 31  ALKPHOS 65 48 44  BILITOT 1.6* 1.9* 1.8*   PT/INR Recent Labs    12/10/22 1631  INR 1.0      Imaging/Other results: US ABDOMEN LIMITED RUQ (LIVER/GB)  Result Date: 12/11/2022 CLINICAL DATA:  Elevated liver enzymes EXAM: ULTRASOUND ABDOMEN LIMITED RIGHT UPPER QUADRANT COMPARISON:  None Available. FINDINGS: Gallbladder: No gallstones or wall thickening visualized. No sonographic Murphy sign noted by sonographer. Common bile duct: Diameter: 5 mm Liver: No focal lesion identified. Increased hepatic parenchymal echogenicity. Portal vein is patent on color Doppler imaging with normal direction of blood flow towards the liver. Other: None. IMPRESSION: Increased hepatic parenchymal echogenicity, which  is nonspecific but is most commonly seen in the setting of hepatic steatosis. Electronically Signed   By: Jacob Moores M.D.   On: 12/11/2022 17:52   DG Chest 1 View  Result Date: 12/10/2022 CLINICAL DATA:  Vomiting EXAM: CHEST  1 VIEW COMPARISON:  None Available. FINDINGS: The heart size and mediastinal contours are within normal limits. Both lungs are clear. The visualized skeletal structures are unremarkable. IMPRESSION: No active disease. Electronically Signed   By: Judie Petit.  Shick M.D.   On: 12/10/2022 15:44      Assessment &Plan  37 yr F with h/o ETOH abuse admitted with hematemesis, no further episodes since admission She remains hemodynamically stable.  No significant drop in hgb  Patient was initially scheduled for EGD this afternoon but all scheduled cases in the endoscopy unit were cancelled by hospital administration, no clear timeline when the endoscopy unit will be able to operate back up  Given patient has no further episodes of hematemesis If she tolerates diet, patient can be discharged home Continue PPI twice daily X 2 months followed by once daily Anti reflux measures Alcohol cessation  Follow up with PCP and repeat CMP and CBC in 1 week  GI will sign off, available if have any questions    K. Scherry Ran , MD [phone]  [facility]

## 2022-12-12 NOTE — Discharge Summary (Signed)
Discharge Summary  Deanna [name] JYN:[id number] DOB: [birth year]  PCP: Marcine Matar, MD  Admit date: 12/10/2022 Discharge date: 12/12/2022  Time spent: 35 minutes   Recommendations for Outpatient Follow-up:  Follow up with your PCP   Discharge Diagnoses:  Active Hospital Problems   Diagnosis Date Noted   Upper GI bleed 12/10/2022   Alcohol abuse 12/11/2022   Chronic diastolic CHF (congestive heart failure) (HCC) 12/11/2022   Hypokalemia 12/11/2022   Essential hypertension 07/26/2019   PAF (paroxysmal atrial fibrillation) (HCC) 07/26/2019   Obesity 10/12/2013    Resolved Hospital Problems  No resolved problems to display.    Discharge Condition: Stable   Diet recommendation: Previous diet   Vitals:   12/12/22 0727 12/12/22 1053  BP: (!) 155/101 (!) 150/95  Pulse: 69 96  Resp: 16 19  Temp: 98.6 F (37 C) 98.7 F (37.1 C)  SpO2: 96% 97%    History of present illness:   37 year old female with history of paroxysmal A-fib on p.o. diltiazem, not anticoagulated, essential hypertension, obesity, alcohol abuse, chronic HFpEF, who presented with hematemesis on the day of her presentation.  She drinks 1/5 of liquor a day and last alcohol beverage was the day prior to her presentation.  Endorses use of NSAIDs, Goody powder.   Upon admission the patient was noted to have acute blood loss anemia with hemoglobin from 14K to 12K.  The patient was placed on Protonix drip and GI was consulted.  No recurrent GI bleed reported.  EGD on 12/12/2022.   12/12/2022: No new complaints.  Hospital Course:  Principal Problem:   Upper GI bleed Active Problems:   Obesity   Essential hypertension   PAF (paroxysmal atrial fibrillation) (HCC)   Alcohol abuse   Chronic diastolic CHF (congestive heart failure) (HCC)   Hypokalemia   Resolved Hematemesis Upper GI bleed in the setting of alcohol abuse and NSAIDs use Rule out Mallory Weiss tear, esophagitis, alcoholic gastritis,  PUD Currently hemodynamically stable and hemoglobin is stable No recurrent GI bleed reported Continue PPI   Acute blood loss anemia in the setting of upper GI bleed Hemoglobin stable at 12 from 14 Repeat H&H x 1 No reported recurrent overt GI bleed   Elevated liver chemistries suspect from alcohol abuse Follow right upper quadrant ultrasound Trend LFTs Avoid hepatotoxic agents   Alcohol abuse Consumes at least 1/5 of liquor per day Last alcohol intake was the day prior to presentation Continue CIWA protocol   Resolved Hypokalemia   Elevated TSH Suspect subclinical hypothyroidism TSH 8.540 Obtain free T4   Paroxysmal atrial fibrillation, currently not anticoagulated Resume home Cardizem    Essential hypertension Resume home Cardizem stated above    Obesity BMI 38 Recommend weight loss outpatient with regular physical activity and healthy dieting.        Consultants: GI    Discharge Exam: BP (!) 150/95 (BP Location: Left Arm)   Pulse 96   Temp 98.7 F (37.1 C) (Oral)   Resp 19   Ht 5\' 5"  (1.651 m)   Wt 105.7 kg   SpO2 97%   BMI 38.78 kg/m  General: 37 y.o. year-old female well developed well nourished in no acute distress.  Alert and oriented x3. Cardiovascular: Regular rate and rhythm with no rubs or gallops.  No thyromegaly or JVD noted.   Respiratory: Clear to auscultation with no wheezes or rales. Good inspiratory effort. Abdomen: Soft nontender nondistended with normal bowel sounds x4 quadrants. Musculoskeletal: No lower extremity edema. 2/4 pulses  in all 4 extremities. Skin: No ulcerative lesions noted or rashes, Psychiatry: Mood is appropriate for condition and setting  Discharge Instructions You were cared for by a hospitalist during your hospital stay. If you have any questions about your discharge medications or the care you received while you were in the hospital after you are discharged, you can call the unit and asked to speak with the  hospitalist on call if the hospitalist that took care of you is not available. Once you are discharged, your primary care physician will handle any further medical issues. Please note that NO REFILLS for any discharge medications will be authorized once you are discharged, as it is imperative that you return to your primary care physician (or establish a relationship with a primary care physician if you do not have one) for your aftercare needs so that they can reassess your need for medications and monitor your lab values.   Allergies as of 12/12/2022   No Known Allergies      Medication List     TAKE these medications    diltiazem 180 MG 24 hr capsule Commonly known as: Cartia XT Take 1 capsule (180 mg total) by mouth daily.   folic acid 1 MG tablet Commonly known as: FOLVITE Take 1 tablet (1 mg total) by mouth daily. Start taking on: December 13, 2022   hydrochlorothiazide 12.5 MG capsule Commonly known as: MICROZIDE Take 1 capsule (12.5 mg total) by mouth daily.   medroxyPROGESTERone 150 MG/ML injection Commonly known as: DEPO-PROVERA Inject 150 mg into the muscle every 3 (three) months.   multivitamin with minerals Tabs tablet Take 1 tablet by mouth daily. Start taking on: December 13, 2022   pantoprazole 40 MG tablet Commonly known as: PROTONIX Take 1 tablet (40 mg total) by mouth 2 (two) times daily. Take 1 tablet by mouth twice daily for 2 months (Until 02/08/23). Then decrease to one tablet one time daily. Start taking on: February 09, 2023   pantoprazole 40 MG tablet Commonly known as: Protonix Take 1 tablet (40 mg total) by mouth daily. Start taking on: February 09, 2023   thiamine 100 MG tablet Commonly known as: Vitamin B-1 Take 1 tablet (100 mg total) by mouth daily. Start taking on: December 13, 2022       No Known Allergies  Follow-up Information     Marcine Matar, MD. Schedule an appointment as soon as possible for a visit today.   Specialty: Internal  Medicine Why: please call for a post hospital follow up appointment   -[facility] will reach back out to her to scheule appointment. Contact information: [address] [place] [address] [phone]                  The results of significant diagnostics from this hospitalization (including imaging, microbiology, ancillary and laboratory) are listed below for reference.    Significant Diagnostic Studies: US ABDOMEN LIMITED RUQ (LIVER/GB)  Result Date: 12/11/2022 CLINICAL DATA:  Elevated liver enzymes EXAM: ULTRASOUND ABDOMEN LIMITED RIGHT UPPER QUADRANT COMPARISON:  None Available. FINDINGS: Gallbladder: No gallstones or wall thickening visualized. No sonographic Murphy sign noted by sonographer. Common bile duct: Diameter: 5 mm Liver: No focal lesion identified. Increased hepatic parenchymal echogenicity. Portal vein is patent on color Doppler imaging with normal direction of blood flow towards the liver. Other: None. IMPRESSION: Increased hepatic parenchymal echogenicity, which is nonspecific but is most commonly seen in the setting of hepatic steatosis. Electronically  Signed   By: Jacob Moores M.D.   On: 12/11/2022 17:52   DG Chest 1 View  Result Date: 12/10/2022 CLINICAL DATA:  Vomiting EXAM: CHEST  1 VIEW COMPARISON:  None Available. FINDINGS: The heart size and mediastinal contours are within normal limits. Both lungs are clear. The visualized skeletal structures are unremarkable. IMPRESSION: No active disease. Electronically Signed   By: Judie Petit.  Shick M.D.   On: 12/10/2022 15:44    Microbiology: Recent Results (from the past 240 hour(s))  MRSA Next Gen by PCR, Nasal     Status: None   Collection Time: 12/11/22  2:58 AM   Specimen: Nasal Mucosa; Nasal Swab  Result Value Ref Range Status   MRSA by PCR Next Gen NOT DETECTED NOT DETECTED Final    Comment: (NOTE) The GeneXpert MRSA Assay (FDA approved for NASAL specimens only), is one  component of a comprehensive MRSA colonization surveillance program. It is not intended to diagnose MRSA infection nor to guide or monitor treatment for MRSA infections. Test performance is not FDA approved in patients less than 42 years old. Performed at [facility], [address] [place], [place] [address]      Labs: Basic Metabolic Panel: Recent Labs  Lab 12/10/22 1459 12/10/22 1631 12/10/22 2252 12/10/22 2337 12/11/22 0116 12/12/22 0027  NA 139  --  134*  --  135 134*  K 2.7*  --  3.2*  --  3.2* 3.6  CL 99  --  98  --  99 103  CO2 22  --  24  --  24 22  GLUCOSE 110*  --  94  --  88 90  BUN <5*  --  5*  --  <5* 5*  CREATININE 0.68  --  0.63  --  0.64 0.66  CALCIUM 9.6  --  8.7*  --  8.5* 8.6*  MG  --  1.4*  --   --  2.0 1.8  PHOS  --   --   --  3.0 2.8 2.4*   Liver Function Tests: Recent Labs  Lab 12/10/22 1459 12/11/22 0116 12/12/22 0027  AST 147* 78* 84*  ALT 48* 34 31  ALKPHOS 65 48 44  BILITOT 1.6* 1.9* 1.8*  PROT 8.7* 6.6 6.3*  ALBUMIN 4.1 3.1* 3.1*   No results for input(s): "LIPASE", "AMYLASE" in the last 168 hours. Recent Labs  Lab 12/10/22 1911  AMMONIA 54*   CBC: Recent Labs  Lab 12/10/22 2252 12/11/22 0116 12/11/22 1122 12/11/22 1746 12/12/22 0027  WBC 5.0 4.1 4.5 4.5 4.6  HGB 12.4 12.0 11.8* 12.6 11.3*  HCT 36.9 36.5 34.1* 36.5 33.9*  MCV 90.2 91.5 91.9 91.9 90.9  PLT 162 164 159 156 143*   Cardiac Enzymes: Recent Labs  Lab 12/10/22 2337  CKTOTAL 119   BNP: BNP (last 3 results) No results for input(s): "BNP" in the last 8760 hours.  ProBNP (last 3 results) No results for input(s): "PROBNP" in the last 8760 hours.  CBG: No results for input(s): "GLUCAP" in the last 168 hours.     Signed:  Darlin Drop, MD [facility] 12/12/2022, 4:49 PM

## 2022-12-15 ENCOUNTER — Encounter: Payer: Self-pay | Admitting: *Deleted

## 2022-12-15 ENCOUNTER — Telehealth: Payer: Self-pay | Admitting: *Deleted

## 2022-12-15 NOTE — Transitions of Care (Post Inpatient/ED Visit) (Signed)
   12/15/2022  Name: Deanna [name] MRN: [id number] DOB: 10/04/1985  Today's TOC FU Call Status: Today's TOC FU Call Status:: Unsuccessul Call (1st Attempt) Unsuccessful Call (1st Attempt) Date: 12/15/22  Attempted to reach the patient regarding the most recent Inpatient visit; received automated outgoing voice message stating that patient's voice mail box is full; unable to leave message requesting call back   Follow Up Plan: Additional outreach attempts will be made to reach the patient to complete the Transitions of Care (Post Inpatient visit) call.   Caryl Pina, RN, BSN, CCRN Alumnus RN CM Care Coordination/ Transition of Care- [facility] Care Management [phone]: direct office

## 2022-12-16 ENCOUNTER — Encounter: Payer: Self-pay | Admitting: *Deleted

## 2022-12-16 ENCOUNTER — Telehealth: Payer: Self-pay | Admitting: *Deleted

## 2022-12-16 NOTE — Transitions of Care (Post Inpatient/ED Visit) (Signed)
   12/16/2022  Name: Deanna [name] MRN: [id number] DOB: 02-07-1986  Today's TOC FU Call Status: Today's TOC FU Call Status:: Unsuccessful Call (2nd Attempt) Unsuccessful Call (2nd Attempt) Date: 12/16/22  Attempted to reach the patient regarding the most recent Inpatient visit; left HIPAA compliant voice message requesting call back  Follow Up Plan: Additional outreach attempts will be made to reach the patient to complete the Transitions of Care (Post Inpatient visit) call.   Caryl Pina, RN, BSN, CCRN Alumnus RN CM Care Coordination/ Transition of Care- [facility] Care Management [phone]: direct office

## 2022-12-17 ENCOUNTER — Encounter: Payer: Self-pay | Admitting: *Deleted

## 2022-12-17 ENCOUNTER — Telehealth: Payer: Self-pay | Admitting: *Deleted

## 2022-12-17 NOTE — Transitions of Care (Post Inpatient/ED Visit) (Signed)
   12/17/2022  Name: Deanna [name] MRN: [id number] DOB: 1985/10/13  Today's TOC FU Call Status: Today's TOC FU Call Status:: Unsuccessful Call (3rd Attempt) Unsuccessful Call (3rd Attempt) Date: 12/17/22  Attempted to reach the patient regarding the most recent Inpatient visit; left HIPAA compliant voice message requesting call back  Follow Up Plan: No further outreach attempts will be made at this time. We have been unable to contact the patient.  Caryl Pina, RN, BSN, CCRN Alumnus RN CM Care Coordination/ Transition of Care- [facility] Care Management [phone]: direct office

## 2022-12-23 ENCOUNTER — Other Ambulatory Visit: Payer: Self-pay

## 2023-02-13 ENCOUNTER — Other Ambulatory Visit: Payer: Self-pay | Admitting: Pharmacist

## 2023-02-13 ENCOUNTER — Other Ambulatory Visit: Payer: Self-pay

## 2023-02-13 DIAGNOSIS — I10 Essential (primary) hypertension: Secondary | ICD-10-CM

## 2023-02-13 MED ORDER — DILTIAZEM HCL ER COATED BEADS 180 MG PO CP24
180.0000 mg | ORAL_CAPSULE | Freq: Every day | ORAL | 0 refills | Status: DC
Start: 2023-02-13 — End: 2023-04-22
  Filled 2023-02-13 – 2023-03-04 (×2): qty 30, 30d supply, fill #0

## 2023-02-18 ENCOUNTER — Ambulatory Visit: Payer: Medicaid Other | Admitting: Pharmacist

## 2023-02-18 NOTE — Progress Notes (Unsigned)
   S:    38 y.o. female who presents for hypertension evaluation, education, and management.  PMH is significant for HTN, morbid obesity, PAF, CHF.   Patient was last seen by Primary Care Provider, Dr. Laural Benes, on 05/23/2022. At last visit, diltiazem was increased to 180 mg daily. Of note, patient was admitted from 7/18-7/19 for hematemesis with nausea. No changes to BP medications were made.   Today, patient arrives in *** spirits and presents without *** assistance. *** Denies dizziness, headache, blurred vision, swelling.   Patient reports hypertension was diagnosed in ***.   Family/Social history:  - FHx: HTN, DM - Tobacco Use: never  Medication adherence *** . Patient has *** taken BP medications today.   Current antihypertensives include: diltiazem 180 mg 24 hr daily, hydrochlorothiazide 12.5 mg daily  Antihypertensives tried in the past include: valsartan   Reported home BP readings: ***  Patient reported dietary habits: Eats *** meals/day Breakfast: *** Lunch: *** Dinner: *** Snacks: *** Drinks: ***  Patient-reported exercise habits: ***  ASCVD risk factors include: ***  O:   Last 3 Office BP readings: BP Readings from Last 3 Encounters:  12/12/22 (!) 150/95  05/23/22 (!) 141/101  12/26/21 (!) 146/102    BMET    Component Value Date/Time   NA 134 (L) 12/12/2022 0027   NA 139 05/23/2022 1139   K 3.6 12/12/2022 0027   CL 103 12/12/2022 0027   CO2 22 12/12/2022 0027   GLUCOSE 90 12/12/2022 0027   BUN 5 (L) 12/12/2022 0027   BUN 8 05/23/2022 1139   CREATININE 0.66 12/12/2022 0027   CALCIUM 8.6 (L) 12/12/2022 0027   GFRNONAA >60 12/12/2022 0027   GFRAA 136 04/10/2020 1556    Renal function: CrCl cannot be calculated (Patient's most recent lab result is older than the maximum 21 days allowed.).  Clinical ASCVD: {YES/NO:21197} The ASCVD Risk score (Arnett DK, et al., 2019) failed to calculate for the following reasons:   The 2019 ASCVD risk score is  only valid for ages 91 to 57  Patient is participating in a Managed Medicaid Plan:  {MM YES/NO:27447::"Yes"}    A/P: Hypertension diagnosed *** currently *** on current medications. BP goal < 130/80 *** mmHg. Medication adherence appears ***. Control is suboptimal due to ***.  -{Meds adjust:18428} ***.  -{Meds adjust:18428} ***.  -Patient educated on purpose, proper use, and potential adverse effects of ***.  -F/u labs ordered - *** -Counseled on lifestyle modifications for blood pressure control including reduced dietary sodium, increased exercise, adequate sleep. -Encouraged patient to check BP at home and bring log of readings to next visit. Counseled on proper use of home BP cuff.   Results reviewed and written information provided.    Written patient instructions provided. Patient verbalized understanding of treatment plan.  Total time in face to face counseling *** minutes.    Follow-up:  Pharmacist ***. PCP clinic visit in ***.  Patient seen with ***

## 2023-02-23 ENCOUNTER — Other Ambulatory Visit: Payer: Self-pay

## 2023-03-04 ENCOUNTER — Other Ambulatory Visit: Payer: Self-pay

## 2023-03-05 ENCOUNTER — Other Ambulatory Visit: Payer: Self-pay

## 2023-03-16 ENCOUNTER — Other Ambulatory Visit: Payer: Self-pay

## 2023-04-22 ENCOUNTER — Other Ambulatory Visit: Payer: Self-pay | Admitting: Internal Medicine

## 2023-04-22 ENCOUNTER — Other Ambulatory Visit: Payer: Self-pay

## 2023-04-22 DIAGNOSIS — I10 Essential (primary) hypertension: Secondary | ICD-10-CM

## 2023-04-22 MED ORDER — DILTIAZEM HCL ER COATED BEADS 180 MG PO CP24
180.0000 mg | ORAL_CAPSULE | Freq: Every day | ORAL | 1 refills | Status: DC
Start: 2023-04-22 — End: 2023-07-14
  Filled 2023-04-22: qty 30, 30d supply, fill #0
  Filled 2023-06-15 – 2023-07-08 (×2): qty 30, 30d supply, fill #1

## 2023-04-22 MED ORDER — HYDROCHLOROTHIAZIDE 12.5 MG PO CAPS
12.5000 mg | ORAL_CAPSULE | Freq: Every day | ORAL | 1 refills | Status: DC
Start: 2023-04-22 — End: 2023-07-14
  Filled 2023-04-22: qty 90, 90d supply, fill #0

## 2023-04-22 NOTE — Telephone Encounter (Signed)
Medication Refill -  Most Recent Primary Care Visit:  Provider: Jonah Blue B  Department: [facility]  Visit Type: OFFICE VISIT  Date: 05/23/2022  Medication: hydrochlorothiazide (MICROZIDE) 12.5 MG capsule and diltiazem (CARTIA XT) 180 MG 24 hr capsule   Has the patient contacted their pharmacy? Yes  Is this the correct pharmacy for this prescription? Yes If no, delete pharmacy and type the correct one.  This is the patient's preferred pharmacy:  [facility] Phone: [phone]  Fax: [phone]      Has the prescription been filled recently? No  Is the patient out of the medication? Yes  Has the patient been seen for an appointment in the last year OR does the patient have an upcoming appointment? Yes  Can we respond through MyChart? Yes  Agent: Please be advised that Rx refills may take up to 3 business days. We ask that you follow-up with your pharmacy.

## 2023-04-22 NOTE — Telephone Encounter (Signed)
Requested medication (s) are due for refill today - yes  Requested medication (s) are on the active medication list -yes  Future visit scheduled -yes  Last refill: - diltiazem 02/13/23 #30                   Hydrochlorothiazide 05/23/22 #90 1RF   Notes to clinic: refused by office- needs appointment- has been scheduled 06/15/23, duplicate request  Requested Prescriptions  Pending Prescriptions Disp Refills   diltiazem (CARTIA XT) 180 MG 24 hr capsule 30 capsule 0    Sig: Take 1 capsule (180 mg total) by mouth daily.     Cardiovascular: Calcium Channel Blockers 3 Failed - 04/22/2023 12:52 PM      Failed - AST in normal range and within 360 days    AST  Date Value Ref Range Status  12/12/2022 84 (H) 15 - 41 U/L Final         Failed - Last BP in normal range    BP Readings from Last 1 Encounters:  12/12/22 (!) 150/95         Failed - Valid encounter within last 6 months    Recent Outpatient Visits           11 months ago Essential hypertension   [place] [facility] [place] [facility] Dept Of [place]. [facility] Marcine Matar, MD   2 years ago Loud snoring   [place] [facility] [place] - [facility] Dept Of [place]. [facility] Marcine Matar, MD   2 years ago Essential hypertension   [place] [facility] [place] - [facility] Dept Of [place]. [facility] Marcine Matar, MD   2 years ago Bad odor of urine   [place] [facility] [place] - A Dept Of [place]. [facility] Marcine Matar, MD   2 years ago No-show for appointment   [facility] - A Dept Of [place]. [facility] Marcine Matar, MD       Future Appointments             In 1 month Laural Benes Binnie Rail, MD [facility] [place] - A Dept Of Eligha Bridegroom. [facility]            Passed - ALT in normal range and within 360 days    ALT  Date Value Ref Range Status  12/12/2022 31 0 - 44 U/L Final         Passed - Cr  in normal range and within 360 days    Creatinine, Ser  Date Value Ref Range Status  12/12/2022 0.66 0.44 - 1.00 mg/dL Final         Passed - Last Heart Rate in normal range    Pulse Readings from Last 1 Encounters:  12/12/22 96          hydrochlorothiazide (MICROZIDE) 12.5 MG capsule 90 capsule 1    Sig: Take 1 capsule (12.5 mg total) by mouth daily.     Cardiovascular: Diuretics - Thiazide Failed - 04/22/2023 12:52 PM      Failed - Na in normal range and within 180 days    Sodium  Date Value Ref Range Status  12/12/2022 134 (L) 135 - 145 mmol/L Final  05/23/2022 139 134 - 144 mmol/L Final         Failed - Last BP in normal range    BP Readings  from Last 1 Encounters:  12/12/22 (!) 150/95         Failed - Valid encounter within last 6 months    Recent Outpatient Visits           11 months ago Essential hypertension   [place] [facility] [place] [facility] Dept Of [place]. [facility] Marcine Matar, MD   2 years ago Loud snoring   [place] [facility] [place] - A Dept Of [place]. [facility] Marcine Matar, MD   2 years ago Essential hypertension   [place] [facility] [place] - A Dept Of [place]. [facility] Marcine Matar, MD   2 years ago Bad odor of urine   [place] [facility] [place] - A Dept Of [place]. [facility] Marcine Matar, MD   2 years ago No-show for appointment   [facility] - A Dept Of [place]. [facility] Marcine Matar, MD       Future Appointments             In 1 month Laural Benes Binnie Rail, MD [facility] [place] - A Dept Of [place] Bridegroom. [facility]            Passed - Cr in normal range and within 180 days    Creatinine, Ser  Date Value Ref Range Status  12/12/2022 0.66 0.44 - 1.00 mg/dL Final         Passed - K in normal range and within 180 days    Potassium  Date Value Ref Range Status  12/12/2022 3.6 3.5 - 5.1  mmol/L Final            Requested Prescriptions  Pending Prescriptions Disp Refills   diltiazem (CARTIA XT) 180 MG 24 hr capsule 30 capsule 0    Sig: Take 1 capsule (180 mg total) by mouth daily.     Cardiovascular: Calcium Channel Blockers 3 Failed - 04/22/2023 12:52 PM      Failed - AST in normal range and within 360 days    AST  Date Value Ref Range Status  12/12/2022 84 (H) 15 - 41 U/L Final         Failed - Last BP in normal range    BP Readings from Last 1 Encounters:  12/12/22 (!) 150/95         Failed - Valid encounter within last 6 months    Recent Outpatient Visits           11 months ago Essential hypertension   [place] [facility] [place] [facility] Dept Of [place]. [facility] Marcine Matar, MD   2 years ago Loud snoring   [place] [facility] [place] - A Dept Of [place]. [facility] Marcine Matar, MD   2 years ago Essential hypertension   [place] [facility] [place] - A Dept Of [place]. [facility] Marcine Matar, MD   2 years ago Bad odor of urine   [place] [facility] [place] - A Dept Of [place]. [facility] Marcine Matar, MD   2 years ago No-show for appointment   [facility] - A Dept Of [place]. [facility] Marcine Matar, MD       Future Appointments  In 1 month Marcine Matar, MD [facility] [place] - A Dept Of Eligha Bridegroom. [facility]            Passed - ALT in normal range and within 360 days    ALT  Date Value Ref Range Status  12/12/2022 31 0 - 44 U/L Final         Passed - Cr in normal range and within 360 days    Creatinine, Ser  Date Value Ref Range Status  12/12/2022 0.66 0.44 - 1.00 mg/dL Final         Passed - Last Heart Rate in normal range    Pulse Readings from Last 1 Encounters:  12/12/22 96          hydrochlorothiazide (MICROZIDE) 12.5 MG capsule 90 capsule 1    Sig: Take 1  capsule (12.5 mg total) by mouth daily.     Cardiovascular: Diuretics - Thiazide Failed - 04/22/2023 12:52 PM      Failed - Na in normal range and within 180 days    Sodium  Date Value Ref Range Status  12/12/2022 134 (L) 135 - 145 mmol/L Final  05/23/2022 139 134 - 144 mmol/L Final         Failed - Last BP in normal range    BP Readings from Last 1 Encounters:  12/12/22 (!) 150/95         Failed - Valid encounter within last 6 months    Recent Outpatient Visits           11 months ago Essential hypertension   [place] [facility] [place] - [facility] Dept Of [place]. [facility] Marcine Matar, MD   2 years ago Loud snoring   [place] [facility] [place] - A Dept Of [place]. [facility] Marcine Matar, MD   2 years ago Essential hypertension   [place] [facility] [place] - A Dept Of [place]. [facility] Marcine Matar, MD   2 years ago Bad odor of urine   [place] [facility] [place] - A Dept Of [place]. [facility] Marcine Matar, MD   2 years ago No-show for appointment   [facility] - A Dept Of . [facility] Marcine Matar, MD       Future Appointments             In 1 month Laural Benes Binnie Rail, MD [facility] [place] - A Dept Of Eligha Bridegroom. [facility]            Passed - Cr in normal range and within 180 days    Creatinine, Ser  Date Value Ref Range Status  12/12/2022 0.66 0.44 - 1.00 mg/dL Final         Passed - K in normal range and within 180 days    Potassium  Date Value Ref Range Status  12/12/2022 3.6 3.5 - 5.1 mmol/L Final

## 2023-04-24 ENCOUNTER — Other Ambulatory Visit: Payer: Self-pay

## 2023-05-16 ENCOUNTER — Ambulatory Visit (HOSPITAL_COMMUNITY): Payer: Self-pay

## 2023-06-15 ENCOUNTER — Telehealth: Payer: Self-pay | Admitting: Internal Medicine

## 2023-06-15 ENCOUNTER — Other Ambulatory Visit: Payer: Self-pay

## 2023-06-15 ENCOUNTER — Ambulatory Visit: Payer: Medicaid Other | Admitting: Internal Medicine

## 2023-06-15 NOTE — Telephone Encounter (Signed)
[facility] called and spoke to [employer], Pensions consultant about the refill(s) diltiazem requested. Advised it was sent on 04/22/23 #30/1 refill(s). She says there are refills available and it will be ready tomorrow with a co-pay of $4.

## 2023-06-15 NOTE — Telephone Encounter (Signed)
Medication Refill -  Most Recent Primary Care Visit:  Provider: Jonah Blue B  Department: [facility]  Visit Type: OFFICE VISIT  Date: 05/23/2022  Medication: diltiazem (CARTIA XT) 180 MG 24 hr capsule - pt asking at least for a short term fill to get her to her appt on 08/20/23  Has the patient contacted their pharmacy? Yes   Is this the correct pharmacy for this prescription? Yes If no, delete pharmacy and type the correct one.  This is the patient's preferred pharmacy:   [facility] Phone: [phone]  Fax: [phone]      Has the prescription been filled recently? No  Is the patient out of the medication? Yes she has been out of it since last week but she missed her appt this week thinking it was tomorrow not today  Has the patient been seen for an appointment in the last year OR does the patient have an upcoming appointment? Yes she has one scheduled on March 27th and on wait list  Can we respond through MyChart? Yes  Please assist patient further

## 2023-06-24 ENCOUNTER — Other Ambulatory Visit: Payer: Self-pay

## 2023-07-08 ENCOUNTER — Other Ambulatory Visit: Payer: Self-pay

## 2023-07-14 ENCOUNTER — Ambulatory Visit: Payer: Medicaid Other | Attending: Internal Medicine | Admitting: Internal Medicine

## 2023-07-14 ENCOUNTER — Other Ambulatory Visit: Payer: Self-pay

## 2023-07-14 VITALS — BP 140/95 | HR 113 | Temp 98.7°F | Ht 65.0 in | Wt 245.0 lb

## 2023-07-14 DIAGNOSIS — F1020 Alcohol dependence, uncomplicated: Secondary | ICD-10-CM

## 2023-07-14 DIAGNOSIS — I10 Essential (primary) hypertension: Secondary | ICD-10-CM

## 2023-07-14 DIAGNOSIS — I480 Paroxysmal atrial fibrillation: Secondary | ICD-10-CM

## 2023-07-14 DIAGNOSIS — Z23 Encounter for immunization: Secondary | ICD-10-CM

## 2023-07-14 DIAGNOSIS — E66813 Obesity, class 3: Secondary | ICD-10-CM

## 2023-07-14 DIAGNOSIS — Z6841 Body Mass Index (BMI) 40.0 and over, adult: Secondary | ICD-10-CM

## 2023-07-14 MED ORDER — NALTREXONE HCL 50 MG PO TABS
50.0000 mg | ORAL_TABLET | Freq: Every day | ORAL | 1 refills | Status: DC
Start: 2023-07-14 — End: 2023-07-15
  Filled 2023-07-14: qty 30, 30d supply, fill #0

## 2023-07-14 MED ORDER — DILTIAZEM HCL ER COATED BEADS 180 MG PO CP24
180.0000 mg | ORAL_CAPSULE | Freq: Every day | ORAL | 1 refills | Status: DC
Start: 2023-07-14 — End: 2023-09-11
  Filled 2023-07-14: qty 90, 90d supply, fill #0

## 2023-07-14 MED ORDER — VALSARTAN 40 MG PO TABS
40.0000 mg | ORAL_TABLET | Freq: Every day | ORAL | 3 refills | Status: AC
Start: 2023-07-14 — End: ?
  Filled 2023-07-14: qty 90, 90d supply, fill #0

## 2023-07-14 NOTE — Patient Instructions (Signed)
 Stop hydrochlorothiazide.  Continue diltiazem.  We have added a new blood pressure medication called Diovan 40 mg daily.  Check blood pressure at least once a week with goal being 130/80 or lower.  Please record your readings.  Bring your readings with you on your follow-up visit with me in 6 weeks.  I have sent the prescription to the pharmacy for the medication naltrexone 50 mg.  This is to help decrease the cravings for the alcohol.  Please hold off on taking it until you hear back from me with the results of your blood test which should be back in tomorrow.

## 2023-07-14 NOTE — Progress Notes (Signed)
 Patient ID: Deanna [name], female    DOB: [birth year]  MRN: [id number]  CC: Hypertension (HTN f/u. Med refills. /Experiencing dizziness when taking BP med /Yes to flu & pneumonia vax. Instructed to ROI for pap)   Subjective: Deanna [name] is a 38 y.o. female who presents for chronic ds management. Her concerns today include:  Patient with history of HTN, morbid obesity, PAF, ETOH use disorder.    HYPERTENSION/PAF: Currently taking: see medication list -Cardizem 160 mg daily and HCTZ 12.5 mg daily. Med Adherence: [x]  Yes    []  No Medication side effects: []  Yes    [x]  No Adherence with salt restriction: [x]  Yes    []  No Home Monitoring?: []  Yes    [x]  No but does have device Monitoring Frequency:  Home BP results range:  SOB? []  Yes    [x]  No Chest Pain?: []  Yes    [x]  No Leg swelling?: []  Yes    [x]  No Headaches?: [x]  Yes -occasionally Dizziness? [x]  Yes sometimes - drinks a lot of water throughout the day  Comments: no palpitations  Hosp last yr 11/2022 with hematemesis in the setting of alcohol abuse and use of NSAIDs.  Hemoglobin had declined from 14-12.  LFTs showed mild elevation and was thought to be due to EtOH abuse. Today she tells me that she still drinks daily. Drinks 10 shots a day of liquor most days Reports she has decrease the amount from 1/2 bottle of a 1/5th liquor "My gold is to stop."  Never went through treatment program or done AA.  Never had DWI.  Obesity:  up 12 lbs since July 2024. Feels she does okay with eating habits Does a lot of walking at work.  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  Agrees to receiving flu and pneumonia vaccines today.  Patient Active Problem List   Diagnosis Date Noted   Alcohol abuse 12/11/2022   Chronic diastolic CHF (congestive heart failure) (HCC) 12/11/2022   Hypokalemia 12/11/2022   Upper GI bleed 12/10/2022   Abnormal cervical Papanicolaou smear 08/09/2020   Irregular periods 08/09/2020    Vaginal discharge 08/09/2020   Psychophysiological insomnia 08/09/2020   Essential hypertension 07/26/2019   PAF (paroxysmal atrial fibrillation) (HCC) 07/26/2019   Exposure to syphilis 11/04/2017   Obesity 10/12/2013     Current Outpatient Medications on File Prior to Visit  Medication Sig Dispense Refill   medroxyPROGESTERone (DEPO-PROVERA) 150 MG/ML injection Inject 150 mg into the muscle every 3 (three) months.     pantoprazole (PROTONIX) 40 MG tablet Take 1 tablet (40 mg total) by mouth 2 (two) times daily. Take 1 tablet by mouth twice daily for 2 months (Until 02/08/23). Then decrease to one tablet one time daily. 120 tablet 0   pantoprazole (PROTONIX) 40 MG tablet Take 1 tablet (40 mg total) by mouth daily. (Patient not taking: Reported on 07/14/2023) 360 tablet 0   No current facility-administered medications on file prior to visit.    No Known Allergies  Social History   Socioeconomic History   Marital status: Married    Spouse name: Not on file   Number of children: Not on file   Years of education: Not on file   Highest education level: Not on file  Occupational History   Not on file  Tobacco Use   Smoking status: Never   Smokeless tobacco: Never  Vaping Use   Vaping status: Never Used  Substance and  Sexual Activity   Alcohol use: Yes   Drug use: No   Sexual activity: Yes    Birth control/protection: Injection  Other Topics Concern   Not on file  Social History Narrative   Lives with husband, son, and daughter   Gets to appointments in her car   No advanced directive    Sister and husband may make medical decisions if unable    No exercise   Social Drivers of Health   Financial Resource Strain: Low Risk  (07/14/2023)   Overall Financial Resource Strain (CARDIA)    Difficulty of Paying Living Expenses: Not hard at all  Food Insecurity: No Food Insecurity (07/14/2023)   Hunger Vital Sign    Worried About Running Out of Food in the Last Year: Never true     Ran Out of Food in the Last Year: Never true  Transportation Needs: No Transportation Needs (07/14/2023)   PRAPARE - Administrator, Civil Service (Medical): No    Lack of Transportation (Non-Medical): No  Physical Activity: Inactive (07/14/2023)   Exercise Vital Sign    Days of Exercise per Week: 0 days    Minutes of Exercise per Session: 0 min  Stress: No Stress Concern Present (07/14/2023)   [employer] of Occupational Health - Occupational Stress Questionnaire    Feeling of Stress : Not at all  Social Connections: Socially Isolated (07/14/2023)   Social Connection and Isolation Panel [NHANES]    Frequency of Communication with Friends and Family: More than three times a week    Frequency of Social Gatherings with Friends and Family: More than three times a week    Attends Religious Services: Never    Database administrator or Organizations: No    Attends Banker Meetings: Never    Marital Status: Separated  Intimate Partner Violence: Not At Risk (07/14/2023)   Humiliation, Afraid, Rape, and Kick questionnaire    Fear of Current or Ex-Partner: No    Emotionally Abused: No    Physically Abused: No    Sexually Abused: No    Family History  Problem Relation Age of Onset   Diabetes Maternal Grandfather    Hypertension Mother    Diabetes Mother    Hypertension Father    Diabetes Sister    Hypertension Sister    Sarcoidosis Brother    Anesthesia problems Neg Hx    Hypotension Neg Hx    Malignant hyperthermia Neg Hx    Pseudochol deficiency Neg Hx     Past Surgical History:  Procedure Laterality Date   LEEP     TOOTH EXTRACTION      ROS: Review of Systems Negative except as stated above  PHYSICAL EXAM: BP (!) 140/95   Pulse (!) 113   Temp 98.7 F (37.1 C) (Oral)   Ht 5\' 5"  (1.651 m)   Wt 245 lb (111.1 kg)   SpO2 97%   BMI 40.77 kg/m   Wt Readings from Last 3 Encounters:  07/14/23 245 lb (111.1 kg)  12/11/22 233 lb 0.4 oz (105.7 kg)   05/23/22 239 lb (108.4 kg)   Physical Exam General appearance - alert, well appearing, young to middle-age appearing African-American female, obese and in no distress Mental status - normal [name], behavior, speech, dress, motor activity, and thought processes Eyes - pupils equal and reactive, extraocular eye movements intact Neck - supple, no significant adenopathy Chest - clear to auscultation, no wheezes, rales or rhonchi, symmetric air entry Heart -  normal rate, regular rhythm, normal S1, S2, no murmurs, rubs, clicks or gallops. Pt sounds to be in regular sinus rhythm Extremities - peripheral pulses normal, no pedal edema, no clubbing or cyanosis      Latest Ref Rng & Units 12/12/2022   12:27 AM 12/11/2022    1:16 AM 12/10/2022   10:52 PM  CMP  Glucose 70 - 99 mg/dL 90  88  94   BUN 6 - 20 mg/dL 5  <5  5   Creatinine 6.04 - 1.00 mg/dL 5.40  9.81  1.91   Sodium 135 - 145 mmol/L 134  135  134   Potassium 3.5 - 5.1 mmol/L 3.6  3.2  3.2   Chloride 98 - 111 mmol/L 103  99  98   CO2 22 - 32 mmol/L 22  24  24    Calcium 8.9 - 10.3 mg/dL 8.6  8.5  8.7   Total Protein 6.5 - 8.1 g/dL 6.3  6.6    Total Bilirubin 0.3 - 1.2 mg/dL 1.8  1.9    Alkaline Phos 38 - 126 U/L 44  48    AST 15 - 41 U/L 84  78    ALT 0 - 44 U/L 31  34     Lipid Panel     Component Value Date/Time   CHOL 139 05/23/2022 1139   TRIG 95 05/23/2022 1139   HDL 72 05/23/2022 1139   CHOLHDL 1.9 05/23/2022 1139   LDLCALC 50 05/23/2022 1139    CBC    Component Value Date/Time   WBC 4.6 12/12/2022 0027   RBC 3.73 (L) 12/12/2022 0027   HGB 11.3 (L) 12/12/2022 0027   HGB 15.2 05/23/2022 1139   HCT 33.9 (L) 12/12/2022 0027   HCT 44.5 05/23/2022 1139   PLT 143 (L) 12/12/2022 0027   PLT 251 05/23/2022 1139   MCV 90.9 12/12/2022 0027   MCV 90 05/23/2022 1139   MCH 30.3 12/12/2022 0027   MCHC 33.3 12/12/2022 0027   RDW 13.5 12/12/2022 0027   RDW 12.6 05/23/2022 1139   LYMPHSABS 2.5 07/10/2021 1422   MONOABS 0.8  07/10/2021 1422   EOSABS 0.1 07/10/2021 1422   BASOSABS 0.0 07/10/2021 1422    ASSESSMENT AND PLAN: 1. Essential hypertension (Primary) Repeat blood pressure today is better but not at goal being 130/80 or lower.  Continue diltiazem given her history of PAF.  Advised stopping HCTZ.  Looks like she has had some issues with hypokalemia which can be worsened in someone with alcohol use disorder.  Start Diovan 40 mg daily instead -Advised to check blood pressure at least once a week with goal being 130/80 or lower. Follow-up in 4 weeks with me for recheck. - diltiazem (CARTIA XT) 180 MG 24 hr capsule; Take 1 capsule (180 mg total) by mouth daily.  Dispense: 90 capsule; Refill: 1 - valsartan (DIOVAN) 40 MG tablet; Take 1 tablet (40 mg total) by mouth daily.  Dispense: 90 tablet; Refill: 3 - CBC - Comprehensive metabolic panel  2. Alcohol use disorder, moderate, dependence (HCC) Patient is an alcoholic. Discussed health risks associated with alcoholism. Advised to quit or at least try to cut back significantly more.  Patient states that it is her intent to quit but she knows it will be hard.  We discussed use of naltrexone to help her quit.  Advised that the medication will decrease cravings for alcohol.  She is not on any narcotic medications by prescription and does not purchase any narcotic  street drugs.  Advised that if she is on narcotic medicines that can cause withdrawals.  Other option is referring her to a treatment center that offers Vivitrol injection once a month.  Patient prefers to try the oral medicine.  Prescription written today for naltrexone.  Advised her to hold off on taking it until I get the results of blood test that she has done today. - naltrexone (DEPADE) 50 MG tablet; Take 1 tablet (50 mg total) by mouth daily.  Dispense: 30 tablet; Refill: 1  3. PAF (paroxysmal atrial fibrillation) (HCC) Seems to be in sinus rhythm on auscultation.  Continue Cardizem.  4. Morbid  (severe) obesity due to excess calories (HCC) Patient advised to eliminate sugary drinks from the diet, cut back on portion sizes especially of white carbohydrates, eat more white lean meat like chicken [place] and seafood instead of beef or pork and incorporate fresh fruits and vegetables into the diet daily. Encouraged her to move as much as she can.  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 Need for influenza vaccination Given today  6. Need for vaccination against Streptococcus pneumoniae Pneumonia 20 given today.     Patient was given the opportunity to ask questions.  Patient verbalized understanding of the plan and was able to repeat key elements of the plan.   This documentation was completed using Paediatric nurse.  Any transcriptional errors are unintentional.  Orders Placed This Encounter  Procedures   CBC   Comprehensive metabolic panel   Lipid panel     Requested Prescriptions   Signed Prescriptions Disp Refills   diltiazem (CARTIA XT) 180 MG 24 hr capsule 90 capsule 1    Sig: Take 1 capsule (180 mg total) by mouth daily.   valsartan (DIOVAN) 40 MG tablet 90 tablet 3    Sig: Take 1 tablet (40 mg total) by mouth daily.   naltrexone (DEPADE) 50 MG tablet 30 tablet 1    Sig: Take 1 tablet (50 mg total) by mouth daily.    Return in about 6 weeks (around 08/25/2023) for for recheck BP and see how she is doing on Naltrexone. Jonah Blue, MD, FACP

## 2023-07-15 ENCOUNTER — Other Ambulatory Visit: Payer: Self-pay

## 2023-07-15 ENCOUNTER — Telehealth: Payer: Self-pay | Admitting: Internal Medicine

## 2023-07-15 ENCOUNTER — Encounter: Payer: Self-pay | Admitting: Internal Medicine

## 2023-07-15 ENCOUNTER — Other Ambulatory Visit: Payer: Self-pay | Admitting: Internal Medicine

## 2023-07-15 DIAGNOSIS — R7989 Other specified abnormal findings of blood chemistry: Secondary | ICD-10-CM

## 2023-07-15 DIAGNOSIS — D751 Secondary polycythemia: Secondary | ICD-10-CM

## 2023-07-15 LAB — COMPREHENSIVE METABOLIC PANEL
ALT: 101 [IU]/L — ABNORMAL HIGH (ref 0–32)
AST: 315 [IU]/L — ABNORMAL HIGH (ref 0–40)
Albumin: 4.3 g/dL (ref 3.9–4.9)
Alkaline Phosphatase: 88 [IU]/L (ref 44–121)
BUN/Creatinine Ratio: 7 — ABNORMAL LOW (ref 9–23)
BUN: 4 mg/dL — ABNORMAL LOW (ref 6–20)
Bilirubin Total: 0.6 mg/dL (ref 0.0–1.2)
CO2: 24 mmol/L (ref 20–29)
Calcium: 9.4 mg/dL (ref 8.7–10.2)
Chloride: 99 mmol/L (ref 96–106)
Creatinine, Ser: 0.59 mg/dL (ref 0.57–1.00)
Globulin, Total: 3.3 g/dL (ref 1.5–4.5)
Glucose: 88 mg/dL (ref 70–99)
Potassium: 3.5 mmol/L (ref 3.5–5.2)
Sodium: 143 mmol/L (ref 134–144)
Total Protein: 7.6 g/dL (ref 6.0–8.5)
eGFR: 120 mL/min/{1.73_m2} (ref >59)

## 2023-07-15 LAB — CBC
Hematocrit: 48.7 % — ABNORMAL HIGH (ref 34.0–46.6)
Hemoglobin: 16.8 g/dL — ABNORMAL HIGH (ref 11.1–15.9)
MCH: 30.9 pg (ref 26.6–33.0)
MCHC: 34.5 g/dL (ref 31.5–35.7)
MCV: 90 fL (ref 79–97)
Platelets: 235 10*3/uL (ref 150–450)
RBC: 5.43 x10E6/uL — ABNORMAL HIGH (ref 3.77–5.28)
RDW: 12.9 % (ref 11.7–15.4)
WBC: 5.6 10*3/uL (ref 3.4–10.8)

## 2023-07-15 LAB — LIPID PANEL
Chol/HDL Ratio: 1.8 {ratio} (ref 0.0–4.4)
Cholesterol, Total: 155 mg/dL (ref 100–199)
HDL: 88 mg/dL (ref >39)
LDL Chol Calc (NIH): 51 mg/dL (ref 0–99)
Triglycerides: 85 mg/dL (ref 0–149)
VLDL Cholesterol Cal: 16 mg/dL (ref 5–40)

## 2023-07-15 NOTE — Telephone Encounter (Signed)
 Phone call placed to patient this morning to go over lab results.  I left a message on her voicemail informing of who I am and requesting that she call us back.  I also went ahead and sent explanation of lab results to her MyChart.  If patient calls back, please refer to result messages posted in MyChart.  Results for orders placed or performed in visit on 07/14/23  CBC   Collection Time: 07/14/23 12:06 PM  Result Value Ref Range   WBC 5.6 3.4 - 10.8 x10E3/uL   RBC 5.43 (H) 3.77 - 5.28 x10E6/uL   Hemoglobin 16.8 (H) 11.1 - 15.9 g/dL   Hematocrit 91.4 (H) 78.2 - 46.6 %   MCV 90 79 - 97 fL   MCH 30.9 26.6 - 33.0 pg   MCHC 34.5 31.5 - 35.7 g/dL   RDW 95.6 21.3 - 08.6 %   Platelets 235 150 - 450 x10E3/uL  Comprehensive metabolic panel   Collection Time: 07/14/23 12:06 PM  Result Value Ref Range   Glucose 88 70 - 99 mg/dL   BUN 4 (L) 6 - 20 mg/dL   Creatinine, Ser 5.78 0.57 - 1.00 mg/dL   eGFR 469 >62 XB/MWU/1.32   BUN/Creatinine Ratio 7 (L) 9 - 23   Sodium 143 134 - 144 mmol/L   Potassium 3.5 3.5 - 5.2 mmol/L   Chloride 99 96 - 106 mmol/L   CO2 24 20 - 29 mmol/L   Calcium 9.4 8.7 - 10.2 mg/dL   Total Protein 7.6 6.0 - 8.5 g/dL   Albumin 4.3 3.9 - 4.9 g/dL   Globulin, Total 3.3 1.5 - 4.5 g/dL   Bilirubin Total 0.6 0.0 - 1.2 mg/dL   Alkaline Phosphatase 88 44 - 121 IU/L   AST 315 (H) 0 - 40 IU/L   ALT 101 (H) 0 - 32 IU/L  Lipid panel   Collection Time: 07/14/23 12:06 PM  Result Value Ref Range   Cholesterol, Total 155 100 - 199 mg/dL   Triglycerides 85 0 - 149 mg/dL   HDL 88 >44 mg/dL   VLDL Cholesterol Cal 16 5 - 40 mg/dL   LDL Chol Calc (NIH) 51 0 - 99 mg/dL   Chol/HDL Ratio 1.8 0.0 - 4.4 ratio

## 2023-07-23 ENCOUNTER — Other Ambulatory Visit: Payer: Self-pay

## 2023-08-08 ENCOUNTER — Ambulatory Visit (HOSPITAL_COMMUNITY)

## 2023-08-20 ENCOUNTER — Ambulatory Visit: Payer: Medicaid Other | Admitting: Internal Medicine

## 2023-09-08 ENCOUNTER — Inpatient Hospital Stay (HOSPITAL_COMMUNITY)
Admission: EM | Admit: 2023-09-08 | Discharge: 2023-09-11 | DRG: 378 | Disposition: A | Discharge status: Home / Self Care | Attending: Internal Medicine | Admitting: Internal Medicine

## 2023-09-08 ENCOUNTER — Emergency Department (HOSPITAL_COMMUNITY)

## 2023-09-08 ENCOUNTER — Encounter (HOSPITAL_COMMUNITY): Payer: Self-pay

## 2023-09-08 ENCOUNTER — Ambulatory Visit: Payer: Medicaid Other | Admitting: Internal Medicine

## 2023-09-08 ENCOUNTER — Other Ambulatory Visit: Payer: Self-pay

## 2023-09-08 DIAGNOSIS — Z833 Family history of diabetes mellitus: Secondary | ICD-10-CM | POA: Diagnosis not present

## 2023-09-08 DIAGNOSIS — D6959 Other secondary thrombocytopenia: Secondary | ICD-10-CM | POA: Diagnosis present

## 2023-09-08 DIAGNOSIS — I4891 Unspecified atrial fibrillation: Secondary | ICD-10-CM | POA: Diagnosis present

## 2023-09-08 DIAGNOSIS — E669 Obesity, unspecified: Secondary | ICD-10-CM | POA: Diagnosis present

## 2023-09-08 DIAGNOSIS — Z6841 Body Mass Index (BMI) 40.0 and over, adult: Secondary | ICD-10-CM | POA: Diagnosis not present

## 2023-09-08 DIAGNOSIS — F1010 Alcohol abuse, uncomplicated: Secondary | ICD-10-CM | POA: Diagnosis present

## 2023-09-08 DIAGNOSIS — Z8249 Family history of ischemic heart disease and other diseases of the circulatory system: Secondary | ICD-10-CM

## 2023-09-08 DIAGNOSIS — E876 Hypokalemia: Secondary | ICD-10-CM | POA: Diagnosis present

## 2023-09-08 DIAGNOSIS — R000 Tachycardia, unspecified: Secondary | ICD-10-CM | POA: Diagnosis present

## 2023-09-08 DIAGNOSIS — Z604 Social exclusion and rejection: Secondary | ICD-10-CM | POA: Diagnosis present

## 2023-09-08 DIAGNOSIS — R7989 Other specified abnormal findings of blood chemistry: Secondary | ICD-10-CM

## 2023-09-08 DIAGNOSIS — I10 Essential (primary) hypertension: Secondary | ICD-10-CM | POA: Diagnosis present

## 2023-09-08 DIAGNOSIS — F10939 Alcohol use, unspecified with withdrawal, unspecified: Principal | ICD-10-CM

## 2023-09-08 DIAGNOSIS — F10239 Alcohol dependence with withdrawal, unspecified: Secondary | ICD-10-CM | POA: Diagnosis present

## 2023-09-08 DIAGNOSIS — K920 Hematemesis: Secondary | ICD-10-CM | POA: Diagnosis present

## 2023-09-08 DIAGNOSIS — Z79899 Other long term (current) drug therapy: Secondary | ICD-10-CM | POA: Diagnosis not present

## 2023-09-08 DIAGNOSIS — E66813 Obesity, class 3: Secondary | ICD-10-CM | POA: Diagnosis present

## 2023-09-08 LAB — PROTIME-INR
INR: 1 (ref 0.8–1.2)
Prothrombin Time: 13.4 s (ref 11.4–15.2)

## 2023-09-08 LAB — CBC WITH DIFFERENTIAL/PLATELET
Abs Immature Granulocytes: 0.01 10*3/uL (ref 0.00–0.07)
Basophils Absolute: 0 10*3/uL (ref 0.0–0.1)
Basophils Relative: 1 %
Eosinophils Absolute: 0 10*3/uL (ref 0.0–0.5)
Eosinophils Relative: 1 %
HCT: 45.5 % (ref 36.0–46.0)
Hemoglobin: 15.4 g/dL — ABNORMAL HIGH (ref 12.0–15.0)
Immature Granulocytes: 0 %
Lymphocytes Relative: 39 %
Lymphs Abs: 1.8 10*3/uL (ref 0.7–4.0)
MCH: 31 pg (ref 26.0–34.0)
MCHC: 33.8 g/dL (ref 30.0–36.0)
MCV: 91.7 fL (ref 80.0–100.0)
Monocytes Absolute: 0.8 10*3/uL (ref 0.1–1.0)
Monocytes Relative: 17 %
Neutro Abs: 1.9 10*3/uL (ref 1.7–7.7)
Neutrophils Relative %: 42 %
Platelets: 167 10*3/uL (ref 150–400)
RBC: 4.96 MIL/uL (ref 3.87–5.11)
RDW: 13.8 % (ref 11.5–15.5)
WBC: 4.5 10*3/uL (ref 4.0–10.5)
nRBC: 0 % (ref 0.0–0.2)

## 2023-09-08 LAB — URINALYSIS, ROUTINE W REFLEX MICROSCOPIC
Bilirubin Urine: NEGATIVE
Glucose, UA: NEGATIVE mg/dL
Hgb urine dipstick: NEGATIVE
Ketones, ur: NEGATIVE mg/dL
Leukocytes,Ua: NEGATIVE
Nitrite: NEGATIVE
Protein, ur: NEGATIVE mg/dL
Specific Gravity, Urine: 1.017 (ref 1.005–1.030)
pH: 6 (ref 5.0–8.0)

## 2023-09-08 LAB — COMPREHENSIVE METABOLIC PANEL WITH GFR
ALT: 39 U/L (ref 0–44)
AST: 204 U/L — ABNORMAL HIGH (ref 15–41)
Albumin: 3.5 g/dL (ref 3.5–5.0)
Alkaline Phosphatase: 54 U/L (ref 38–126)
Anion gap: 15 (ref 5–15)
BUN: <5 mg/dL — ABNORMAL LOW (ref 6–20)
CO2: 23 mmol/L (ref 22–32)
Calcium: 8.8 mg/dL — ABNORMAL LOW (ref 8.9–10.3)
Chloride: 102 mmol/L (ref 98–111)
Creatinine, Ser: 0.54 mg/dL (ref 0.44–1.00)
GFR, Estimated: >60 mL/min (ref >60)
Glucose, Bld: 91 mg/dL (ref 70–99)
Potassium: 2.8 mmol/L — ABNORMAL LOW (ref 3.5–5.1)
Sodium: 140 mmol/L (ref 135–145)
Total Bilirubin: 1.9 mg/dL — ABNORMAL HIGH (ref 0.0–1.2)
Total Protein: 7.7 g/dL (ref 6.5–8.1)

## 2023-09-08 LAB — PHOSPHORUS: Phosphorus: 2.3 mg/dL — ABNORMAL LOW (ref 2.5–4.6)

## 2023-09-08 LAB — MAGNESIUM: Magnesium: 1.6 mg/dL — ABNORMAL LOW (ref 1.7–2.4)

## 2023-09-08 LAB — HCG, SERUM, QUALITATIVE: Preg, Serum: NEGATIVE

## 2023-09-08 LAB — TYPE AND SCREEN
ABO/RH(D): B POS
Antibody Screen: NEGATIVE

## 2023-09-08 LAB — APTT: aPTT: 24 s (ref 24–36)

## 2023-09-08 LAB — LIPASE, BLOOD: Lipase: 49 U/L (ref 11–51)

## 2023-09-08 MED ORDER — POTASSIUM CHLORIDE CRYS ER 20 MEQ PO TBCR
40.0000 meq | EXTENDED_RELEASE_TABLET | Freq: Once | ORAL | Status: AC
  Administered 2023-09-08: 40 meq via ORAL
  Filled 2023-09-08: qty 2

## 2023-09-08 MED ORDER — POTASSIUM CHLORIDE 20 MEQ PO PACK
60.0000 meq | PACK | Freq: Once | ORAL | Status: AC
  Administered 2023-09-08: 60 meq via ORAL
  Filled 2023-09-08: qty 3

## 2023-09-08 MED ORDER — DEXTROSE IN LACTATED RINGERS 5 % IV SOLN: INTRAVENOUS | Status: AC

## 2023-09-08 MED ORDER — ADULT MULTIVITAMIN W/MINERALS CH
1.0000 | ORAL_TABLET | Freq: Every day | ORAL | Status: DC
  Administered 2023-09-08 – 2023-09-11 (×4): 1 via ORAL
  Filled 2023-09-08 (×4): qty 1

## 2023-09-08 MED ORDER — POTASSIUM CHLORIDE 10 MEQ/100ML IV SOLN
10.0000 meq | INTRAVENOUS | Status: AC
  Administered 2023-09-08 (×2): 10 meq via INTRAVENOUS
  Filled 2023-09-08 (×3): qty 100

## 2023-09-08 MED ORDER — ACETAMINOPHEN 325 MG PO TABS
650.0000 mg | ORAL_TABLET | Freq: Four times a day (QID) | ORAL | Status: DC | PRN
  Administered 2023-09-09: 650 mg via ORAL
  Filled 2023-09-08: qty 2

## 2023-09-08 MED ORDER — FOLIC ACID 1 MG PO TABS
1.0000 mg | ORAL_TABLET | Freq: Every day | ORAL | Status: DC
  Administered 2023-09-08 – 2023-09-11 (×4): 1 mg via ORAL
  Filled 2023-09-08 (×4): qty 1

## 2023-09-08 MED ORDER — ACETAMINOPHEN 650 MG RE SUPP: 650.0000 mg | Freq: Four times a day (QID) | RECTAL | Status: DC | PRN

## 2023-09-08 MED ORDER — SODIUM CHLORIDE 0.9% FLUSH
3.0000 mL | Freq: Two times a day (BID) | INTRAVENOUS | Status: DC
  Administered 2023-09-08 – 2023-09-11 (×6): 3 mL via INTRAVENOUS

## 2023-09-08 MED ORDER — POTASSIUM CHLORIDE 10 MEQ/100ML IV SOLN
10.0000 meq | Freq: Once | INTRAVENOUS | Status: AC
Start: 2023-09-08 — End: 2023-09-08
  Administered 2023-09-08: 10 meq via INTRAVENOUS
  Filled 2023-09-08: qty 100

## 2023-09-08 MED ORDER — LORAZEPAM 2 MG/ML IJ SOLN
1.0000 mg | Freq: Once | INTRAMUSCULAR | Status: AC
  Administered 2023-09-08: 1 mg via INTRAVENOUS
  Filled 2023-09-08: qty 1

## 2023-09-08 MED ORDER — LORAZEPAM 1 MG PO TABS
1.0000 mg | ORAL_TABLET | ORAL | Status: DC | PRN
  Administered 2023-09-09: 1 mg via ORAL
  Administered 2023-09-09: 2 mg via ORAL
  Administered 2023-09-10: 1 mg via ORAL
  Filled 2023-09-08 (×2): qty 1
  Filled 2023-09-08: qty 2

## 2023-09-08 MED ORDER — LORAZEPAM 2 MG/ML IJ SOLN: 1.0000 mg | INTRAMUSCULAR | Status: DC | PRN

## 2023-09-08 MED ORDER — OCTREOTIDE LOAD VIA INFUSION
50.0000 ug | Freq: Once | INTRAVENOUS | Status: AC
  Administered 2023-09-08: 50 ug via INTRAVENOUS
  Filled 2023-09-08: qty 25

## 2023-09-08 MED ORDER — LACTATED RINGERS IV BOLUS
1000.0000 mL | Freq: Once | INTRAVENOUS | Status: AC
  Administered 2023-09-08: 1000 mL via INTRAVENOUS

## 2023-09-08 MED ORDER — POLYETHYLENE GLYCOL 3350 17 G PO PACK: 17.0000 g | PACK | Freq: Every day | ORAL | Status: DC | PRN

## 2023-09-08 MED ORDER — THIAMINE MONONITRATE 100 MG PO TABS
100.0000 mg | ORAL_TABLET | Freq: Every day | ORAL | Status: DC
  Administered 2023-09-08 – 2023-09-11 (×4): 100 mg via ORAL
  Filled 2023-09-08 (×4): qty 1

## 2023-09-08 MED ORDER — MAGNESIUM SULFATE 2 GM/50ML IV SOLN
2.0000 g | Freq: Once | INTRAVENOUS | Status: DC
  Filled 2023-09-08: qty 50

## 2023-09-08 MED ORDER — THIAMINE HCL 100 MG/ML IJ SOLN: 100.0000 mg | Freq: Every day | INTRAMUSCULAR | Status: DC

## 2023-09-08 MED ORDER — HYDRALAZINE HCL 20 MG/ML IJ SOLN
10.0000 mg | INTRAMUSCULAR | Status: DC | PRN
Start: 2023-09-08 — End: 2023-09-11
  Administered 2023-09-09 (×3): 10 mg via INTRAVENOUS
  Filled 2023-09-08 (×3): qty 1

## 2023-09-08 MED ORDER — SODIUM CHLORIDE 0.9 % IV SOLN
50.0000 ug/h | INTRAVENOUS | Status: DC
  Administered 2023-09-08 – 2023-09-10 (×4): 50 ug/h via INTRAVENOUS
  Filled 2023-09-08 (×5): qty 1

## 2023-09-08 MED ORDER — LORAZEPAM 2 MG/ML IJ SOLN
2.0000 mg | Freq: Once | INTRAMUSCULAR | Status: AC
  Administered 2023-09-08: 2 mg via INTRAVENOUS
  Filled 2023-09-08: qty 1

## 2023-09-08 MED ORDER — SODIUM CHLORIDE 0.9 % IV BOLUS
1000.0000 mL | Freq: Once | INTRAVENOUS | Status: AC
  Administered 2023-09-08: 1000 mL via INTRAVENOUS

## 2023-09-08 MED ORDER — PANTOPRAZOLE SODIUM 40 MG IV SOLR
40.0000 mg | Freq: Two times a day (BID) | INTRAVENOUS | Status: DC
  Administered 2023-09-08 – 2023-09-10 (×4): 40 mg via INTRAVENOUS
  Filled 2023-09-08 (×4): qty 10

## 2023-09-08 MED ORDER — LABETALOL HCL 5 MG/ML IV SOLN
20.0000 mg | INTRAVENOUS | Status: DC | PRN
  Administered 2023-09-09 (×2): 20 mg via INTRAVENOUS
  Filled 2023-09-08 (×2): qty 4

## 2023-09-08 NOTE — Assessment & Plan Note (Signed)
 Severe, patient is reporting new onset of numbness sensation of her lower abdomen and bilateral lower extremities.  Which actually started in the abdominal area.  I believe this is symptomatic hypokalemia.  Will aggressively replete this with 40 mEq IV as well as a 60 mg p.o. once now. ( Patient has already received 40 mEq KCl p.o. once in the ER and 10 mEq IV KCl in the ER once.)  Magnesium level pending.

## 2023-09-08 NOTE — Assessment & Plan Note (Signed)
 Chronic . Steatotsis seen on US  liver. Pendign GI eval. Will order acute heaptitis panel for AM

## 2023-09-08 NOTE — ED Triage Notes (Signed)
 Pt reports with upper right abdominal pain, vomiting up blood, and numbness in her stomach and legs x 2 days. Pt reports drinking a lot.

## 2023-09-08 NOTE — Assessment & Plan Note (Addendum)
 Engaged Dr. Feliberto Hopping. Will treat with pantop, iv fluids, octreotide, type and screen and telelemtry. Check inr ptt

## 2023-09-08 NOTE — H&P (Signed)
 History and Physical    Patient: Deanna [name]:[id number] DOB: 08/24/1985 DOA: 09/08/2023 DOS: the patient was seen and examined on 09/08/2023 PCP: Marcine Matar, MD  Patient coming from: Home  Chief Complaint:  Chief Complaint  Patient presents with   Abdominal Pain   HPI: Deanna [name] is a 38 y.o. female with medical history significant of medical issues as listed below.  Patient reports being in her usual state of health till approximately a couple of weeks ago when she reports new onset of on and off sharp right upper quadrant pain without any aggravating or relieving factors.  Without any fever.  Patient also reports associated onset of vomiting.  Patient describes at least 2 episodes of vomiting in the last 7 days in 1 instance patient reports vomiting "pure blood ".  And then reports vomiting again today with a "little bit "of blood.  Patient denies any diarrhea.  Patient reports that her stool color is brown and not black.  Patient denies any gum bleeding nosebleeding or skin bruising.  Patient denies any chest pain or shortness of breath or presyncope.  Patient came principally to the ER because of her right upper quadrant abdominal pain and the vomiting as described above.  Patient had right upper quadrant ultrasound done which was apparently unremarkable.  However patient noted to be tremulous, heart rate in 122 and slightly restless in ER. Felt to have alcohol wtihdrawal. S.p ativan. Medical eval is sought.  Patient is currently pain free Review of Systems: As mentioned in the history of present illness. All other systems reviewed and are negative. Past Medical History:  Diagnosis Date   Abnormal Pap smear    Atrial fibrillation (HCC)    Hypertension    Infection    UTI   Normal pregnancy 10/04/2011   Obese    SVD (spontaneous vaginal delivery) 10/04/2011   Trichomonas    Vaginal Pap smear, abnormal    Past Surgical History:  Procedure Laterality Date   LEEP      TOOTH EXTRACTION     Social History:  reports that she has never smoked. She has never used smokeless tobacco. She reports current alcohol use. She reports that she does not use drugs.  No Known Allergies  Family History  Problem Relation Age of Onset   Diabetes Maternal Grandfather    Hypertension Mother    Diabetes Mother    Hypertension Father    Diabetes Sister    Hypertension Sister    Sarcoidosis Brother    Anesthesia problems Neg Hx    Hypotension Neg Hx    Malignant hyperthermia Neg Hx    Pseudochol deficiency Neg Hx     Prior to Admission medications   Medication Sig Start Date End Date Taking? Authorizing Provider  diltiazem (CARTIA XT) 180 MG 24 hr capsule Take 1 capsule (180 mg total) by mouth daily. 07/14/23   Marcine Matar, MD  medroxyPROGESTERone (DEPO-PROVERA) 150 MG/ML injection Inject 150 mg into the muscle every 3 (three) months.    [provider]  pantoprazole (PROTONIX) 40 MG tablet Take 1 tablet (40 mg total) by mouth 2 (two) times daily. Take 1 tablet by mouth twice daily for 2 months (Until 02/08/23). Then decrease to one tablet one time daily. 02/09/23 04/10/23  Darlin Drop, DO  pantoprazole (PROTONIX) 40 MG tablet Take 1 tablet (40 mg total) by mouth daily. Patient not taking: Reported on 07/14/2023 02/09/23 02/09/24  Darlin Drop, DO  valsartan (  DIOVAN) 40 MG tablet Take 1 tablet (40 mg total) by mouth daily. 07/14/23   Lawrance Presume, MD    Physical Exam: Vitals:   09/08/23 1340 09/08/23 1518 09/08/23 1700 09/08/23 1900  BP:  (!) 166/97 (!) 165/106 (!) 165/113  Pulse:  (!) 113 (!) 110 (!) 120  Resp:  18 19 (!) 24  Temp:  99.3 F (37.4 C)  98.6 F (37 C)  TempSrc:  Oral  Oral  SpO2:  99% 99% 99%  Weight: 111.1 kg     Height: 5\' 5"  (1.651 m)      General: Obese lady appears to be in no distress.  Gives a fully coherent account of her symptoms.  Heart rate still in 120. Respiratory; bilateral intravesicular Cardiovascular  exam S1-S2 normal, tachycardia Abdomen bowel sounds are normal all quadrants are soft and nontender Extremities warm without edema no focal motor deficit. Data Reviewed:  Labs on Admission:  Results for orders placed or performed during the hospital encounter of 09/08/23 (from the past 24 hours)  Comprehensive metabolic panel     Status: Abnormal   Collection Time: 09/08/23  1:40 PM  Result Value Ref Range   Sodium 140 135 - 145 mmol/L   Potassium 2.8 (L) 3.5 - 5.1 mmol/L   Chloride 102 98 - 111 mmol/L   CO2 23 22 - 32 mmol/L   Glucose, Bld 91 70 - 99 mg/dL   BUN <5 (L) 6 - 20 mg/dL   Creatinine, Ser 6.96 0.44 - 1.00 mg/dL   Calcium 8.8 (L) 8.9 - 10.3 mg/dL   Total Protein 7.7 6.5 - 8.1 g/dL   Albumin 3.5 3.5 - 5.0 g/dL   AST 295 (H) 15 - 41 U/L   ALT 39 0 - 44 U/L   Alkaline Phosphatase 54 38 - 126 U/L   Total Bilirubin 1.9 (H) 0.0 - 1.2 mg/dL   GFR, Estimated >28 >41 mL/min   Anion gap 15 5 - 15  Lipase, blood     Status: None   Collection Time: 09/08/23  1:40 PM  Result Value Ref Range   Lipase 49 11 - 51 U/L  CBC with Diff     Status: Abnormal   Collection Time: 09/08/23  1:40 PM  Result Value Ref Range   WBC 4.5 4.0 - 10.5 K/uL   RBC 4.96 3.87 - 5.11 MIL/uL   Hemoglobin 15.4 (H) 12.0 - 15.0 g/dL   HCT 32.4 40.1 - 02.7 %   MCV 91.7 80.0 - 100.0 fL   MCH 31.0 26.0 - 34.0 pg   MCHC 33.8 30.0 - 36.0 g/dL   RDW 25.3 66.4 - 40.3 %   Platelets 167 150 - 400 K/uL   nRBC 0.0 0.0 - 0.2 %   Neutrophils Relative % 42 %   Neutro Abs 1.9 1.7 - 7.7 K/uL   Lymphocytes Relative 39 %   Lymphs Abs 1.8 0.7 - 4.0 K/uL   Monocytes Relative 17 %   Monocytes Absolute 0.8 0.1 - 1.0 K/uL   Eosinophils Relative 1 %   Eosinophils Absolute 0.0 0.0 - 0.5 K/uL   Basophils Relative 1 %   Basophils Absolute 0.0 0.0 - 0.1 K/uL   Immature Granulocytes 0 %   Abs Immature Granulocytes 0.01 0.00 - 0.07 K/uL  Urinalysis, Routine w reflex microscopic -Urine, Clean Catch     Status: Abnormal    Collection Time: 09/08/23  1:40 PM  Result Value Ref Range   Color, Urine Blannie (A)  YELLOW   APPearance CLOUDY (A) CLEAR   Specific Gravity, Urine 1.017 1.005 - 1.030   pH 6.0 5.0 - 8.0   Glucose, UA NEGATIVE NEGATIVE mg/dL   Hgb urine dipstick NEGATIVE NEGATIVE   Bilirubin Urine NEGATIVE NEGATIVE   Ketones, ur NEGATIVE NEGATIVE mg/dL   Protein, ur NEGATIVE NEGATIVE mg/dL   Nitrite NEGATIVE NEGATIVE   Leukocytes,Ua NEGATIVE NEGATIVE  hCG, serum, qualitative     Status: None   Collection Time: 09/08/23  1:40 PM  Result Value Ref Range   Preg, Serum NEGATIVE NEGATIVE   Basic Metabolic Panel: Recent Labs  Lab 09/08/23 1340  NA 140  K 2.8*  CL 102  CO2 23  GLUCOSE 91  BUN <5*  CREATININE 0.54  CALCIUM 8.8*   Liver Function Tests: Recent Labs  Lab 09/08/23 1340  AST 204*  ALT 39  ALKPHOS 54  BILITOT 1.9*  PROT 7.7  ALBUMIN 3.5   Recent Labs  Lab 09/08/23 1340  LIPASE 49   No results for input(s): "AMMONIA" in the last 168 hours. CBC: Recent Labs  Lab 09/08/23 1340  WBC 4.5  NEUTROABS 1.9  HGB 15.4*  HCT 45.5  MCV 91.7  PLT 167   Cardiac Enzymes: No results for input(s): "CKTOTAL", "CKMB", "CKMBINDEX", "TROPONINIHS" in the last 168 hours.  BNP (last 3 results) No results for input(s): "PROBNP" in the last 8760 hours. CBG: No results for input(s): "GLUCAP" in the last 168 hours.  Radiological Exams on Admission:  US Abdomen Limited RUQ (LIVER/GB) Result Date: 09/08/2023 CLINICAL DATA:  Abdominal pain EXAM: ULTRASOUND ABDOMEN LIMITED RIGHT UPPER QUADRANT COMPARISON:  12/11/2022 FINDINGS: Gallbladder: Gallstones: None Sludge: None Gallbladder Wall: Within normal limits Pericholecystic fluid: None Sonographic Murphy's Sign: Negative per technologist Common bile duct: Diameter: 5 mm Liver: Parenchymal echogenicity: Homogeneously increased Contours: Normal Lesions: None Portal vein: Patent.  Hepatopetal flow Other: None. IMPRESSION: Diffuse increased  echogenicity of the hepatic parenchyma is a nonspecific indicator of hepatocellular dysfunction, most commonly steatosis. Electronically Signed   By: Acquanetta Belling M.D.   On: 09/08/2023 17:26     No intake/output data recorded. No intake/output data recorded.      HCG negative.   Assessment and Plan: * Hematemesis Engaged Dr. Marca Ancona. Will treat with pantop, iv fluids, octreotide, type and screen and telelemtry. Check inr ptt  Alcohol withdrawal (HCC) Check TSH, free T4. Aggressive fluid repletion given cocnern for GI bleeding. Withdrawal orderset ordered. Currently well controlled after ativan given in ER. C/w empiric thaimine. Check phos level.  LFT elevation Chronic . Steatotsis seen on US liver. Pendign GI eval. Will order acute heaptitis panel for AM  Hypokalemia Severe, patient is reporting new onset of numbness sensation of her lower abdomen and bilateral lower extremities.  Which actually started in the abdominal area.  I believe this is symptomatic hypokalemia.  Will aggressively replete this with 40 mEq IV as well as a 60 mg p.o. once now. ( Patient has already received 40 mEq KCl p.o. once in the ER and 10 mEq IV KCl in the ER once.)  Magnesium level pending.  Alcohol abuse Counselled to quit as aptietn is haivng adverse effects.      Advance Care Planning:   Code Status: Prior full  Consults: GI as above.  Family Communication: per patietn.  Severity of Illness: The appropriate patient status for this patient is INPATIENT. Inpatient status is judged to be reasonable and necessary in order to provide the required intensity of service to ensure  the patient's safety. The patient's presenting symptoms, physical exam findings, and initial radiographic and laboratory data in the context of their chronic comorbidities is felt to place them at high risk for further clinical deterioration. Furthermore, it is not anticipated that the patient will be medically stable for  discharge from the hospital within 2 midnights of admission.   * I certify that at the point of admission it is my clinical judgment that the patient will require inpatient hospital care spanning beyond 2 midnights from the point of admission due to high intensity of service, high risk for further deterioration and high frequency of surveillance required.*  Author: Bennie Brave, MD 09/08/2023 7:15 PM  For on call review [email or web address].

## 2023-09-08 NOTE — Assessment & Plan Note (Signed)
 Chronic. Will order prn blood pressure meds tonight. If blod pressure holding up, restart PO agents in AM

## 2023-09-08 NOTE — ED Provider Notes (Signed)
 Care was taken over from Dr. Monnie Anthony.  Patient is a heavy alcohol user.  She came in with some upper abdominal pain.  Ultrasound was negative.  Labs show mild elevation in her LFTs which appears to be similar to prior values.  No evidence of pancreatitis.  She overall is well-appearing but appears to be having some withdrawal symptoms.  She was given a couple doses of Ativan and her tremors have improved but she still tachycardic and tremulous.  She is oriented.  She is hypertensive.  No hallucinations.  Will plan admission for alcohol withdrawal symptoms.  She did have some hypokalemia and received potassium replacement in the ED.  I discussed with Dr. Cleda Curly who will admit the patient for further treatment.   Hershel Los, MD 09/08/23 [phone]

## 2023-09-08 NOTE — Assessment & Plan Note (Signed)
 Counselled to quit as aptietn is haivng adverse effects.

## 2023-09-08 NOTE — Assessment & Plan Note (Signed)
 Check TSH, free T4. Aggressive fluid repletion given cocnern for GI bleeding. Withdrawal orderset ordered. Currently well controlled after ativan given in ER. C/w empiric thaimine. Check phos level.

## 2023-09-08 NOTE — ED Provider Notes (Signed)
 [facility]PART[facility] AT [facility] Provider Note   CSN: [id number] Arrival date & time: 09/08/23  1327     History {Add pertinent medical, surgical, social history, OB history to HPI:1} Chief Complaint  Patient presents with   Abdominal Pain    Deanna [name] is a 38 y.o. female.   Abdominal Pain    Pt states she drinks alcohol daily.  She has been having pain in her right upper abdomen for a while it has increased in the last couple days.  Patient states it increased in the last couple of days.  She has had some episodes of nausea and vomiting.  Patient states she did see some blood in her emesis once.  Patient has been having numbness and cramping in her abdomen but also always had numbness in her abdomen and legs.  Patient states she does get shaky when she stops drinking.  She does know that she needs to stop drinking  Home Medications Prior to Admission medications   Medication Sig Start Date End Date Taking? Authorizing Provider  diltiazem (CARTIA XT) 180 MG 24 hr capsule Take 1 capsule (180 mg total) by mouth daily. 07/14/23   Marcine Matar, MD  medroxyPROGESTERone (DEPO-PROVERA) 150 MG/ML injection Inject 150 mg into the muscle every 3 (three) months.    [provider]  pantoprazole (PROTONIX) 40 MG tablet Take 1 tablet (40 mg total) by mouth 2 (two) times daily. Take 1 tablet by mouth twice daily for 2 months (Until 02/08/23). Then decrease to one tablet one time daily. 02/09/23 04/10/23  Darlin Drop, DO  pantoprazole (PROTONIX) 40 MG tablet Take 1 tablet (40 mg total) by mouth daily. Patient not taking: Reported on 07/14/2023 02/09/23 02/09/24  Darlin Drop, DO  valsartan (DIOVAN) 40 MG tablet Take 1 tablet (40 mg total) by mouth daily. 07/14/23   Marcine Matar, MD      Allergies    Patient has no known allergies.    Review of Systems   Review of Systems  Gastrointestinal:  Positive for abdominal pain.    Physical  Exam Updated Vital Signs BP (!) 184/112   Pulse (!) 122   Temp 98.4 F (36.9 C) (Oral)   Resp 18   Ht 1.651 m (5\' 5" )   Wt 111.1 kg   SpO2 100%   BMI 40.76 kg/m  Physical Exam Vitals and nursing note reviewed.  Constitutional:      General: She is not in acute distress.    Appearance: She is well-developed.  HENT:     Head: Normocephalic and atraumatic.     Right Ear: External ear normal.     Left Ear: External ear normal.  Eyes:     General: No scleral icterus.       Right eye: No discharge.        Left eye: No discharge.     Conjunctiva/sclera: Conjunctivae normal.  Neck:     Trachea: No tracheal deviation.  Cardiovascular:     Rate and Rhythm: Regular rhythm. Tachycardia present.  Pulmonary:     Effort: Pulmonary effort is normal. No respiratory distress.     Breath sounds: Normal breath sounds. No stridor. No wheezing or rales.  Abdominal:     General: Bowel sounds are normal. There is no distension.     Palpations: Abdomen is soft.     Tenderness: There is no abdominal tenderness. There is no guarding or rebound.  Musculoskeletal:  General: No tenderness or deformity.     Cervical back: Neck supple.  Skin:    General: Skin is warm and dry.     Findings: No rash.  Neurological:     General: No focal deficit present.     Mental Status: She is alert.     Cranial Nerves: No cranial nerve deficit, dysarthria or facial asymmetry.     Sensory: No sensory deficit.     Motor: Tremor present. No abnormal muscle tone or seizure activity.     Coordination: Coordination normal.  Psychiatric:        Mood and Affect: Mood normal.     ED Results / Procedures / Treatments   Labs (all labs ordered are listed, but only abnormal results are displayed) Labs Reviewed  COMPREHENSIVE METABOLIC PANEL WITH GFR - Abnormal; Notable for the following components:      Result Value   Potassium 2.8 (*)    BUN <5 (*)    Calcium 8.8 (*)    AST 204 (*)    Total Bilirubin 1.9  (*)    All other components within normal limits  CBC WITH DIFFERENTIAL/PLATELET - Abnormal; Notable for the following components:   Hemoglobin 15.4 (*)    All other components within normal limits  LIPASE, BLOOD  HCG, SERUM, QUALITATIVE  URINALYSIS, ROUTINE W REFLEX MICROSCOPIC    EKG None  Radiology No results found.  Procedures Procedures  {Document cardiac monitor, telemetry assessment procedure when appropriate:1}  Medications Ordered in ED Medications - No data to display  ED Course/ Medical Decision Making/ A&P   {   Click here for ABCD2, HEART and other calculatorsREFRESH Note before signing :1}                              Medical Decision Making Amount and/or Complexity of Data Reviewed Labs: ordered. Radiology: ordered.  Risk Prescription drug management.   ***  {Document critical care time when appropriate:1} {Document review of labs and clinical decision tools ie heart score, Chads2Vasc2 etc:1}  {Document your independent review of radiology images, and any outside records:1} {Document your discussion with family members, caretakers, and with consultants:1} {Document social determinants of health affecting pt's care:1} {Document your decision making why or why not admission, treatments were needed:1} Final Clinical Impression(s) / ED Diagnoses Final diagnoses:  None    Rx / DC Orders ED Discharge Orders     None

## 2023-09-09 DIAGNOSIS — K920 Hematemesis: Secondary | ICD-10-CM | POA: Diagnosis not present

## 2023-09-09 LAB — T4, FREE: Free T4: 1.09 ng/dL (ref 0.61–1.12)

## 2023-09-09 LAB — TSH: TSH: 0.917 u[IU]/mL (ref 0.350–4.500)

## 2023-09-09 LAB — CBC
HCT: 38.6 % (ref 36.0–46.0)
Hemoglobin: 12.8 g/dL (ref 12.0–15.0)
MCH: 31.4 pg (ref 26.0–34.0)
MCHC: 33.2 g/dL (ref 30.0–36.0)
MCV: 94.6 fL (ref 80.0–100.0)
Platelets: 86 10*3/uL — ABNORMAL LOW (ref 150–400)
RBC: 4.08 MIL/uL (ref 3.87–5.11)
RDW: 14.5 % (ref 11.5–15.5)
WBC: 4.2 10*3/uL (ref 4.0–10.5)
nRBC: 0 % (ref 0.0–0.2)

## 2023-09-09 LAB — BASIC METABOLIC PANEL WITH GFR
Anion gap: 13 (ref 5–15)
BUN: <5 mg/dL — ABNORMAL LOW (ref 6–20)
CO2: 17 mmol/L — ABNORMAL LOW (ref 22–32)
Calcium: 8.4 mg/dL — ABNORMAL LOW (ref 8.9–10.3)
Chloride: 103 mmol/L (ref 98–111)
Creatinine, Ser: 0.72 mg/dL (ref 0.44–1.00)
GFR, Estimated: >60 mL/min (ref >60)
Glucose, Bld: 128 mg/dL — ABNORMAL HIGH (ref 70–99)
Potassium: 3.8 mmol/L (ref 3.5–5.1)
Sodium: 133 mmol/L — ABNORMAL LOW (ref 135–145)

## 2023-09-09 MED ORDER — CHLORDIAZEPOXIDE HCL 25 MG PO CAPS
25.0000 mg | ORAL_CAPSULE | Freq: Three times a day (TID) | ORAL | Status: AC
  Administered 2023-09-09 (×3): 25 mg via ORAL
  Filled 2023-09-09 (×3): qty 1

## 2023-09-09 MED ORDER — TRAZODONE HCL 50 MG PO TABS
50.0000 mg | ORAL_TABLET | Freq: Every evening | ORAL | Status: DC | PRN
  Administered 2023-09-10: 50 mg via ORAL
  Filled 2023-09-09: qty 1

## 2023-09-09 MED ORDER — CHLORDIAZEPOXIDE HCL 25 MG PO CAPS
25.0000 mg | ORAL_CAPSULE | Freq: Two times a day (BID) | ORAL | Status: AC
  Administered 2023-09-10 (×2): 25 mg via ORAL
  Filled 2023-09-09 (×2): qty 1

## 2023-09-09 MED ORDER — CHLORDIAZEPOXIDE HCL 25 MG PO CAPS: 25.0000 mg | ORAL_CAPSULE | Freq: Every day | ORAL | Status: DC

## 2023-09-09 MED ORDER — SENNOSIDES-DOCUSATE SODIUM 8.6-50 MG PO TABS: 1.0000 | ORAL_TABLET | Freq: Every evening | ORAL | Status: DC | PRN

## 2023-09-09 MED ORDER — MAGNESIUM OXIDE -MG SUPPLEMENT 400 (240 MG) MG PO TABS
400.0000 mg | ORAL_TABLET | Freq: Two times a day (BID) | ORAL | Status: DC
  Administered 2023-09-09: 400 mg via ORAL
  Filled 2023-09-09: qty 1

## 2023-09-09 MED ORDER — GLUCAGON HCL RDNA (DIAGNOSTIC) 1 MG IJ SOLR: 1.0000 mg | INTRAMUSCULAR | Status: DC | PRN

## 2023-09-09 MED ORDER — METOPROLOL TARTRATE 5 MG/5ML IV SOLN
5.0000 mg | INTRAVENOUS | Status: DC | PRN
  Administered 2023-09-09: 5 mg via INTRAVENOUS
  Filled 2023-09-09: qty 5

## 2023-09-09 MED ORDER — POTASSIUM CHLORIDE CRYS ER 20 MEQ PO TBCR
40.0000 meq | EXTENDED_RELEASE_TABLET | Freq: Once | ORAL | Status: AC
  Administered 2023-09-09: 40 meq via ORAL
  Filled 2023-09-09: qty 2

## 2023-09-09 MED ORDER — POTASSIUM PHOSPHATES 15 MMOLE/5ML IV SOLN
30.0000 mmol | Freq: Once | INTRAVENOUS | Status: AC
  Administered 2023-09-09: 30 mmol via INTRAVENOUS
  Filled 2023-09-09: qty 10

## 2023-09-09 MED ORDER — IPRATROPIUM-ALBUTEROL 0.5-2.5 (3) MG/3ML IN SOLN: 3.0000 mL | RESPIRATORY_TRACT | Status: DC | PRN

## 2023-09-09 MED ORDER — GUAIFENESIN 100 MG/5ML PO LIQD: 5.0000 mL | ORAL | Status: DC | PRN

## 2023-09-09 MED ORDER — MAGNESIUM SULFATE 4 GM/100ML IV SOLN
4.0000 g | Freq: Once | INTRAVENOUS | Status: AC
  Administered 2023-09-09: 4 g via INTRAVENOUS
  Filled 2023-09-09: qty 100

## 2023-09-09 NOTE — Plan of Care (Signed)
  Problem: Education: Goal: Knowledge of General Education information will improve Description: Including pain rating scale, medication(s)/side effects and non-pharmacologic comfort measures Outcome: Progressing   Problem: Health Behavior/Discharge Planning: Goal: Ability to manage health-related needs will improve Outcome: Progressing   Problem: Clinical Measurements: Goal: Ability to maintain clinical measurements within normal limits will improve Outcome: Progressing Goal: Will remain free from infection Outcome: Progressing Goal: Diagnostic test results will improve Outcome: Progressing Goal: Respiratory complications will improve Outcome: Progressing Goal: Cardiovascular complication will be avoided Outcome: Progressing   Problem: Nutrition: Goal: Adequate nutrition will be maintained Outcome: Progressing   Problem: Activity: Goal: Risk for activity intolerance will decrease Outcome: Progressing   Problem: Coping: Goal: Level of anxiety will decrease Outcome: Progressing   Problem: Elimination: Goal: Will not experience complications related to bowel motility Outcome: Progressing Goal: Will not experience complications related to urinary retention Outcome: Progressing   Problem: Pain Managment: Goal: General experience of comfort will improve and/or be controlled Outcome: Progressing   Problem: Skin Integrity: Goal: Risk for impaired skin integrity will decrease Outcome: Progressing

## 2023-09-09 NOTE — Progress Notes (Signed)
 PROGRESS NOTE    Deanna [name]  KGM:[id number] DOB: [birth year] DOA: 09/08/2023 PCP: Marcine Matar, MD    Brief Narrative:   38 year old with history of atrial fibrillation, HTN, alcohol use comes to the hospital with complaints of right upper quadrant abdominal pain and few episodes of vomiting and at times slightly bloody.  Right upper quadrant ultrasound unremarkable in the ER, admitted with concerns of hematemesis and alcohol withdrawal.  Assessment & Plan:  Principal Problem:   Hematemesis Active Problems:   Essential hypertension   Alcohol abuse   Hypokalemia   LFT elevation   Alcohol withdrawal (HCC)   Hematemesis [facility] has been consulted.  Currently on PPI IV twice daily, octreotide.   Alcohol withdrawal (HCC) Alcohol withdrawal protocol in place.  Folic acid, multivitamin and thiamine.  LFT elevation Chronic . Steatotsis seen on US liver. [facility] eval. Will order acute heaptitis panel for AM   Hypokalemia/hypophosphatemia/hypomagnesemia Aggressive repletion   Alcohol abuse Counselled to quit as aptietn is haivng adverse effects.    DVT prophylaxis: SCDs Start: 09/08/23 1931    Code Status: Full Code Family Communication:   Continue hospital stay for alcohol withdrawal and hematemesis evaluation    Subjective: Patient seen and examined at bedside.  Tells me she drinks at least 6-7 shots of liquor every day.  She is also noted some hematemesis over the last few days.  Denies any shortness of breath and chest pain at this time.   Examination:  General exam: Appears calm and comfortable  Respiratory system: Clear to auscultation. Respiratory effort normal. Cardiovascular system: Sinus tachycardia Gastrointestinal system: Abdomen is nondistended, soft and nontender. No organomegaly or masses felt. Normal bowel sounds heard. Central nervous system: Alert and oriented. No focal neurological deficits. Extremities: Symmetric 5 x 5 power. Skin: No  rashes, lesions or ulcers Psychiatry: Judgement and insight appear normal. Mood & affect appropriate.                Diet Orders (From admission, onward)     Start     Ordered   09/08/23 1931  Diet clear liquid Room service appropriate? Yes; Fluid consistency: Thin  Diet effective now       Question Answer Comment  Room service appropriate? Yes   Fluid consistency: Thin      09/08/23 1931            Objective: Vitals:   09/09/23 0409 09/09/23 0608 09/09/23 0759 09/09/23 1042  BP: (!) 170/115 (!) 164/98 (!) 186/114 (!) 152/111  Pulse: (!) 110 (!) 115  (!) 109  Resp: 18 17    Temp: 98.7 F (37.1 C) 98.8 F (37.1 C)  98.5 F (36.9 C)  TempSrc: Oral Oral  Oral  SpO2: 100% 98%  99%  Weight:      Height:        Intake/Output Summary (Last 24 hours) at 09/09/2023 1146 Last data filed at 09/09/2023 0300 Gross per 24 hour  Intake 1377.66 ml  Output --  Net 1377.66 ml   Filed Weights   09/08/23 1340  Weight: 111.1 kg    Scheduled Meds:  chlordiazePOXIDE  25 mg Oral TID   Followed by   Melene Muller ON 09/10/2023] chlordiazePOXIDE  25 mg Oral BID   Followed by   Melene Muller ON 09/11/2023] chlordiazePOXIDE  25 mg Oral Q1500   folic acid  1 mg Oral Daily   multivitamin with minerals  1 tablet Oral Daily   pantoprazole (PROTONIX) IV  40 mg  Intravenous Q12H   sodium chloride flush  3 mL Intravenous Q12H   thiamine  100 mg Oral Daily   Or   thiamine  100 mg Intravenous Daily   Continuous Infusions:  dextrose 5% lactated ringers 125 mL/hr at 09/09/23 1127   magnesium sulfate bolus IVPB     octreotide (SANDOSTATIN) 500 mcg in sodium chloride 0.9 % 250 mL (2 mcg/mL) infusion 50 mcg/hr (09/09/23 0838)   potassium PHOSPHATE IVPB (in mmol) 30 mmol (09/09/23 1125)    Nutritional status     Body mass index is 40.76 kg/m.  Data Reviewed:   CBC: Recent Labs  Lab 09/08/23 1340 09/09/23 0403  WBC 4.5 4.2  NEUTROABS 1.9  --   HGB 15.4* 12.8  HCT 45.5 38.6  MCV 91.7  94.6  PLT 167 86*   Basic Metabolic Panel: Recent Labs  Lab 09/08/23 1340 09/08/23 2131 09/08/23 2154 09/09/23 0403  NA 140  --   --  133*  K 2.8*  --   --  3.8  CL 102  --   --  103  CO2 23  --   --  17*  GLUCOSE 91  --   --  128*  BUN <5*  --   --  <5*  CREATININE 0.54  --   --  0.72  CALCIUM 8.8*  --   --  8.4*  MG  --  1.6*  --   --   PHOS  --   --  2.3*  --    GFR: Estimated Creatinine Clearance: 120.6 mL/min (by C-G formula based on SCr of 0.72 mg/dL). Liver Function Tests: Recent Labs  Lab 09/08/23 1340  AST 204*  ALT 39  ALKPHOS 54  BILITOT 1.9*  PROT 7.7  ALBUMIN 3.5   Recent Labs  Lab 09/08/23 1340  LIPASE 49   No results for input(s): "AMMONIA" in the last 168 hours. Coagulation Profile: Recent Labs  Lab 09/08/23 2131  INR 1.0   Cardiac Enzymes: No results for input(s): "CKTOTAL", "CKMB", "CKMBINDEX", "TROPONINI" in the last 168 hours. BNP (last 3 results) No results for input(s): "PROBNP" in the last 8760 hours. HbA1C: No results for input(s): "HGBA1C" in the last 72 hours. CBG: No results for input(s): "GLUCAP" in the last 168 hours. Lipid Profile: No results for input(s): "CHOL", "HDL", "LDLCALC", "TRIG", "CHOLHDL", "LDLDIRECT" in the last 72 hours. Thyroid Function Tests: Recent Labs    09/09/23 0403  TSH 0.917  FREET4 1.09   Anemia Panel: No results for input(s): "VITAMINB12", "FOLATE", "FERRITIN", "TIBC", "IRON", "RETICCTPCT" in the last 72 hours. Sepsis Labs: No results for input(s): "PROCALCITON", "LATICACIDVEN" in the last 168 hours.  No results found for this or any previous visit (from the past 240 hours).       Radiology Studies: US Abdomen Limited RUQ (LIVER/GB) Result Date: 09/08/2023 CLINICAL DATA:  Abdominal pain EXAM: ULTRASOUND ABDOMEN LIMITED RIGHT UPPER QUADRANT COMPARISON:  12/11/2022 FINDINGS: Gallbladder: Gallstones: None Sludge: None Gallbladder Wall: Within normal limits Pericholecystic fluid: None  Sonographic Murphy's Sign: Negative per technologist Common bile duct: Diameter: 5 mm Liver: Parenchymal echogenicity: Homogeneously increased Contours: Normal Lesions: None Portal vein: Patent.  Hepatopetal flow Other: None. IMPRESSION: Diffuse increased echogenicity of the hepatic parenchyma is a nonspecific indicator of hepatocellular dysfunction, most commonly steatosis. Electronically Signed   By: Acquanetta Belling M.D.   On: 09/08/2023 17:26           LOS: 1 day   Time spent= 35 mins  Maggie Schooner, MD [facility]  If 7PM-7AM, please contact night-coverage  09/09/2023, 11:46 AM

## 2023-09-09 NOTE — TOC Transition Note (Signed)
 Transition of Care [facility]) - Discharge Note   Patient Details  Name: Deanna [name] MRN: [id number] Date of Birth: 11-23-1985  [facility] CM/SW Contact:  Gertha Ku, LCSW Phone Number: 09/09/2023, 2:24 PM   Clinical Narrative:    CSW met with pt to discuss consult for substance use resources , pt has declined. No further TOC needs, TOC sign off.      Barriers to Discharge: Continued Medical Work up   Patient Goals and CMS Choice Patient states their goals for this hospitalization and ongoing recovery are:: return home          Discharge Placement                       Discharge Plan and Services Additional resources added to the After Visit Summary for                                       Social Drivers of Health (SDOH) Interventions SDOH Screenings   Food Insecurity: No Food Insecurity (09/08/2023)  Housing: Low Risk  (09/08/2023)  Transportation Needs: No Transportation Needs (09/08/2023)  Utilities: Not At Risk (09/08/2023)  Alcohol Screen: Low Risk  (07/14/2023)  Depression (PHQ2-9): Low Risk  (07/14/2023)  Financial Resource Strain: Low Risk  (07/14/2023)  Physical Activity: Inactive (07/14/2023)  Social Connections: Socially Isolated (07/14/2023)  Stress: No Stress Concern Present (07/14/2023)  Tobacco Use: Low Risk  (09/08/2023)  Health Literacy: Adequate Health Literacy (07/14/2023)     Readmission Risk Interventions     No data to display

## 2023-09-09 NOTE — Progress Notes (Signed)
   09/09/23 0130  Assess: MEWS Score  Temp 98.2 F (36.8 C)  BP (!) 169/112  MAP (mmHg) 126  Pulse Rate (!) 129  ECG Heart Rate (!) 129  Resp 20  SpO2 97 %  O2 Device Room Air  Assess: MEWS Score  MEWS Temp 0  MEWS Systolic 0  MEWS Pulse 2  MEWS RR 0  MEWS LOC 0  MEWS Score 2  MEWS Score Color Yellow  Assess: if the MEWS score is Yellow or Red  Were vital signs accurate and taken at a resting state? Yes  Does the patient meet 2 or more of the SIRS criteria? No  MEWS guidelines implemented  Yes, yellow  Treat  MEWS Interventions Considered administering scheduled or prn medications/treatments as ordered  Take Vital Signs  Increase Vital Sign Frequency  Yellow: Q2hr x1, continue Q4hrs until patient remains green for 12hrs  Escalate  MEWS: Escalate Yellow: Discuss with charge nurse and consider notifying provider and/or RRT  Notify: Charge Nurse/RN  Name of Charge Nurse/RN Notified [facility]  Provider Notification  Provider Name/Title Lemuel Quaker NP  Date Provider Notified 09/09/23  Time Provider Notified [phone]  Method of Notification Page (Chat)  Notification Reason Other (Comment) (yellow MEWS)  Provider response Other (Comment)  Assess: SIRS CRITERIA  SIRS Temperature  0  SIRS Respirations  0  SIRS Pulse 1  SIRS WBC 0  SIRS Score Sum  1

## 2023-09-09 NOTE — Consult Note (Signed)
 Referring Provider: Dr. Nelson Chimes Primary Care Physician:  Marcine Matar, MD Primary Gastroenterologist:  Gentry Fitz  Reason for Consultation:  Nausea/vomiting; Hematemesis  HPI: Deanna [name] is a 38 y.o. female presented with several episodes of vomiting that started yesterday and initially was clear and then she had a red blood in her vomitus X 2 with the first having blood mixed in it and the second episode being small amount of blood. Denies ever vomiting blood in the past. RUQ pain as well. Denies melena or hematochezia. Drinks 6 shots of Tequila per day for years. Used to use NSAIDs until several years ago. Denies heartburn. Tolerating clear liquids. U/S unrevealing.   Past Medical History:  Diagnosis Date   Abnormal Pap smear    Atrial fibrillation (HCC)    Hypertension    Infection    UTI   Normal pregnancy 10/04/2011   Obese    SVD (spontaneous vaginal delivery) 10/04/2011   Trichomonas    Vaginal Pap smear, abnormal     Past Surgical History:  Procedure Laterality Date   LEEP     TOOTH EXTRACTION      Prior to Admission medications   Medication Sig Start Date End Date Taking? Authorizing Provider  medroxyPROGESTERone (DEPO-PROVERA) 150 MG/ML injection Inject 150 mg into the muscle every 3 (three) months.   Yes [provider]  valsartan (DIOVAN) 40 MG tablet Take 1 tablet (40 mg total) by mouth daily. 07/14/23  Yes Marcine Matar, MD  diltiazem (CARTIA XT) 180 MG 24 hr capsule Take 1 capsule (180 mg total) by mouth daily. Patient not taking: Reported on 09/08/2023 07/14/23   Marcine Matar, MD  pantoprazole (PROTONIX) 40 MG tablet Take 1 tablet (40 mg total) by mouth 2 (two) times daily. Take 1 tablet by mouth twice daily for 2 months (Until 02/08/23). Then decrease to one tablet one time daily. 02/09/23 04/10/23  Darlin Drop, DO    Scheduled Meds:  chlordiazePOXIDE  25 mg Oral TID   Followed by   Melene Muller ON 09/10/2023] chlordiazePOXIDE  25 mg Oral  BID   Followed by   Melene Muller ON 09/11/2023] chlordiazePOXIDE  25 mg Oral Q1500   folic acid  1 mg Oral Daily   multivitamin with minerals  1 tablet Oral Daily   pantoprazole (PROTONIX) IV  40 mg Intravenous Q12H   sodium chloride flush  3 mL Intravenous Q12H   thiamine  100 mg Oral Daily   Or   thiamine  100 mg Intravenous Daily   Continuous Infusions:  dextrose 5% lactated ringers 125 mL/hr at 09/09/23 1127   magnesium sulfate bolus IVPB     octreotide (SANDOSTATIN) 500 mcg in sodium chloride 0.9 % 250 mL (2 mcg/mL) infusion 50 mcg/hr (09/09/23 0838)   potassium PHOSPHATE IVPB (in mmol) 30 mmol (09/09/23 1125)   PRN Meds:.acetaminophen **OR** acetaminophen, glucagon (human recombinant), guaiFENesin, hydrALAZINE, ipratropium-albuterol, LORazepam **OR** LORazepam, metoprolol tartrate, polyethylene glycol, senna-docusate, traZODone  Allergies as of 09/08/2023   (No Known Allergies)    Family History  Problem Relation Age of Onset   Diabetes Maternal Grandfather    Hypertension Mother    Diabetes Mother    Hypertension Father    Diabetes Sister    Hypertension Sister    Sarcoidosis Brother    Anesthesia problems Neg Hx    Hypotension Neg Hx    Malignant hyperthermia Neg Hx    Pseudochol deficiency Neg Hx     Social History   Socioeconomic History  Marital status: Married    Spouse name: Not on file   Number of children: Not on file   Years of education: Not on file   Highest education level: Not on file  Occupational History   Not on file  Tobacco Use   Smoking status: Never   Smokeless tobacco: Never  Vaping Use   Vaping status: Never Used  Substance and Sexual Activity   Alcohol use: Yes   Drug use: No   Sexual activity: Yes    Birth control/protection: Injection  Other Topics Concern   Not on file  Social History Narrative   Lives with husband, son, and daughter   Gets to appointments in her car   No advanced directive    Sister and husband may make  medical decisions if unable    No exercise   Social Drivers of Health   Financial Resource Strain: Low Risk  (07/14/2023)   Overall Financial Resource Strain (CARDIA)    Difficulty of Paying Living Expenses: Not hard at all  Food Insecurity: No Food Insecurity (09/08/2023)   Hunger Vital Sign    Worried About Running Out of Food in the Last Year: Never true    Ran Out of Food in the Last Year: Never true  Transportation Needs: No Transportation Needs (09/08/2023)   PRAPARE - Administrator, Civil Service (Medical): No    Lack of Transportation (Non-Medical): No  Physical Activity: Inactive (07/14/2023)   Exercise Vital Sign    Days of Exercise per Week: 0 days    Minutes of Exercise per Session: 0 min  Stress: No Stress Concern Present (07/14/2023)   [employer] of Occupational Health - Occupational Stress Questionnaire    Feeling of Stress : Not at all  Social Connections: Socially Isolated (07/14/2023)   Social Connection and Isolation Panel [NHANES]    Frequency of Communication with Friends and Family: More than three times a week    Frequency of Social Gatherings with Friends and Family: More than three times a week    Attends Religious Services: Never    Database administrator or Organizations: No    Attends Banker Meetings: Never    Marital Status: Separated  Intimate Partner Violence: Not At Risk (09/08/2023)   Humiliation, Afraid, Rape, and Kick questionnaire    Fear of Current or Ex-Partner: No    Emotionally Abused: No    Physically Abused: No    Sexually Abused: No    Review of Systems: All negative except as stated above in HPI.  Physical Exam: Vital signs: Vitals:   09/09/23 0759 09/09/23 1042  BP: (!) 186/114 (!) 152/111  Pulse:  (!) 109  Resp:    Temp:  98.5 F (36.9 C)  SpO2:  99%  R 18  Last BM Date : 09/08/23 General:  Lethargic, obese, pleasant and cooperative in NAD, multiple tattoos Head: normocephalic,  atraumatic Eyes: anicteric sclera ENT: oropharynx clear Neck: supple, nontender Lungs:  Clear throughout to auscultation.   No wheezes, crackles, or rhonchi. No acute distress. Heart:  Regular rate and rhythm; no murmurs, clicks, rubs,  or gallops. Abdomen: soft, nontender, nondistended, +BS  Rectal:  Deferred Ext: no edema  GI:  Lab Results: Recent Labs    09/08/23 1340 09/09/23 0403  WBC 4.5 4.2  HGB 15.4* 12.8  HCT 45.5 38.6  PLT 167 86*   BMET Recent Labs    09/08/23 1340 09/09/23 0403  NA 140 133*  K 2.8*  3.8  CL 102 103  CO2 23 17*  GLUCOSE 91 128*  BUN <5* <5*  CREATININE 0.54 0.72  CALCIUM 8.8* 8.4*   LFT Recent Labs    09/08/23 1340  PROT 7.7  ALBUMIN 3.5  AST 204*  ALT 39  ALKPHOS 54  BILITOT 1.9*   PT/INR Recent Labs    09/08/23 2131  LABPROT 13.4  INR 1.0     Studies/Results: US  Abdomen Limited RUQ (LIVER/GB) Result Date: 09/08/2023 CLINICAL DATA:  Abdominal pain EXAM: ULTRASOUND ABDOMEN LIMITED RIGHT UPPER QUADRANT COMPARISON:  12/11/2022 FINDINGS: Gallbladder: Gallstones: None Sludge: None Gallbladder Wall: Within normal limits Pericholecystic fluid: None Sonographic Murphy's Sign: Negative per technologist Common bile duct: Diameter: 5 mm Liver: Parenchymal echogenicity: Homogeneously increased Contours: Normal Lesions: None Portal vein: Patent.  Hepatopetal flow Other: None. IMPRESSION: Diffuse increased echogenicity of the hepatic parenchyma is a nonspecific indicator of hepatocellular dysfunction, most commonly steatosis. Electronically Signed   By: Elester Grim M.D.   On: 09/08/2023 17:26    Impression/Plan: Hematemesis that was preceded by an episode without bleeding - suspect Mallory Weiss tear vs erosive esophagitis or gastritis in the setting of alcohol abuse. No signs of ongoing bleeding. Tolerating clear liquids without vomiting. Continue PPI IV Q 12 hours. If continues to tolerate clear liquids then advance diet tomorrow. I do not  think an EGD is needed unless signs of ongoing bleeding or intractable vomiting. Will follow.    LOS: 1 day   Deanna [name]  09/09/2023, 11:59 AM  Questions please call [phone]

## 2023-09-09 NOTE — Hospital Course (Addendum)
 Brief Narrative:   38 year old with history of atrial fibrillation, HTN, alcohol use comes to the hospital with complaints of right upper quadrant abdominal pain and few episodes of vomiting and at times slightly bloody.  Right upper quadrant ultrasound unremarkable in the ER, admitted with concerns of hematemesis and alcohol withdrawal. During the hospitalization hematemesis resolved with conservative management, hemoglobin remained stable.  GI did not recommend any further endoscopic evaluation and deferred this outpatient if it becomes necessary in the future. Completed Librium  taper, will be prescribed folic acid , thiamine  and multivitamin.  Metoprolol  twice daily was started for tachycardia. Today she is medically stable for discharge as she is feeling significantly well.  Assessment & Plan:  Principal Problem:   Hematemesis Active Problems:   Essential hypertension   Alcohol abuse   Hypokalemia   LFT elevation   Alcohol withdrawal (HCC)   Hematemesis Resolved, seen by [facility].  Hemoglobin remained stable 13.0.  Continue PPI   Alcohol withdrawal (HCC) Alcohol withdrawal protocol in place.  Folic acid , multivitamin and thiamine . Librium  taper completed.  Counseled to quit using alcohol  Sinus tachycardia - Appears to be now chronic.  Will start metoprolol  25 mg twice daily  LFT elevation Chronic . Steatotsis seen on US  liver.  Acute otitis panel is negative  Thrombocytopenia - Likely from underlying etoh use.  We will closely monitor.   Hypokalemia/hypophosphatemia/hypomagnesemia Aggressive repletion   Alcohol abuse Counselled to quit as aptietn is haivng adverse effects.    DVT prophylaxis: SCDs Start: 09/08/23 1931    Code Status: Full Code Family Communication:   Discharge today  Subjective: Feels better, wishing to go home. Examination:  General exam: Appears calm and comfortable  Respiratory system: Clear to auscultation. Respiratory effort  normal. Cardiovascular system: Sinus tachycardia Gastrointestinal system: Abdomen is nondistended, soft and nontender. No organomegaly or masses felt. Normal bowel sounds heard. Central nervous system: Alert and oriented. No focal neurological deficits. Extremities: Symmetric 5 x 5 power. Skin: No rashes, lesions or ulcers Psychiatry: Judgement and insight appear normal. Mood & affect appropriate.

## 2023-09-10 DIAGNOSIS — K920 Hematemesis: Secondary | ICD-10-CM | POA: Diagnosis not present

## 2023-09-10 LAB — COMPREHENSIVE METABOLIC PANEL WITH GFR
ALT: 26 U/L (ref 0–44)
AST: 96 U/L — ABNORMAL HIGH (ref 15–41)
Albumin: 2.9 g/dL — ABNORMAL LOW (ref 3.5–5.0)
Alkaline Phosphatase: 42 U/L (ref 38–126)
Anion gap: 8 (ref 5–15)
BUN: <5 mg/dL — ABNORMAL LOW (ref 6–20)
CO2: 25 mmol/L (ref 22–32)
Calcium: 8.4 mg/dL — ABNORMAL LOW (ref 8.9–10.3)
Chloride: 103 mmol/L (ref 98–111)
Creatinine, Ser: 0.58 mg/dL (ref 0.44–1.00)
GFR, Estimated: >60 mL/min (ref >60)
Glucose, Bld: 121 mg/dL — ABNORMAL HIGH (ref 70–99)
Potassium: 3.9 mmol/L (ref 3.5–5.1)
Sodium: 136 mmol/L (ref 135–145)
Total Bilirubin: 2.1 mg/dL — ABNORMAL HIGH (ref 0.0–1.2)
Total Protein: 6.3 g/dL — ABNORMAL LOW (ref 6.5–8.1)

## 2023-09-10 LAB — CBC
HCT: 40.4 % (ref 36.0–46.0)
Hemoglobin: 13.4 g/dL (ref 12.0–15.0)
MCH: 30.9 pg (ref 26.0–34.0)
MCHC: 33.2 g/dL (ref 30.0–36.0)
MCV: 93.3 fL (ref 80.0–100.0)
Platelets: 133 10*3/uL — ABNORMAL LOW (ref 150–400)
RBC: 4.33 MIL/uL (ref 3.87–5.11)
RDW: 14.4 % (ref 11.5–15.5)
WBC: 4.5 10*3/uL (ref 4.0–10.5)
nRBC: 0 % (ref 0.0–0.2)

## 2023-09-10 LAB — HEPATITIS PANEL, ACUTE
HCV Ab: NONREACTIVE
Hep A IgM: NONREACTIVE
Hep B C IgM: NONREACTIVE
Hepatitis B Surface Ag: NONREACTIVE

## 2023-09-10 LAB — MAGNESIUM: Magnesium: 2.1 mg/dL (ref 1.7–2.4)

## 2023-09-10 LAB — PHOSPHORUS: Phosphorus: 2.2 mg/dL — ABNORMAL LOW (ref 2.5–4.6)

## 2023-09-10 MED ORDER — HYDROCODONE-ACETAMINOPHEN 5-325 MG PO TABS
1.0000 | ORAL_TABLET | Freq: Once | ORAL | Status: AC
  Administered 2023-09-10: 1 via ORAL
  Filled 2023-09-10: qty 1

## 2023-09-10 MED ORDER — PANTOPRAZOLE SODIUM 40 MG PO TBEC
40.0000 mg | DELAYED_RELEASE_TABLET | Freq: Two times a day (BID) | ORAL | Status: DC
  Administered 2023-09-11: 40 mg via ORAL
  Filled 2023-09-10: qty 1

## 2023-09-10 NOTE — Plan of Care (Signed)

## 2023-09-10 NOTE — Plan of Care (Signed)
  Problem: Education: Goal: Knowledge of General Education information will improve Description: Including pain rating scale, medication(s)/side effects and non-pharmacologic comfort measures Outcome: Progressing   Problem: Health Behavior/Discharge Planning: Goal: Ability to manage health-related needs will improve Outcome: Progressing   Problem: Clinical Measurements: Goal: Diagnostic test results will improve Outcome: Progressing   Problem: Nutrition: Goal: Adequate nutrition will be maintained Outcome: Progressing   Problem: Coping: Goal: Level of anxiety will decrease Outcome: Progressing   

## 2023-09-10 NOTE — Progress Notes (Signed)
 [facility] Gastroenterology Progress Note  Miyanna N Shorten 38 y.o. April 05, 1986   Subjective: No further N/V. Hungry. Denies abdominal pain.  Objective: Vital signs: Vitals:   09/10/23 0457 09/10/23 1034  BP: (!) 156/105 (!) 153/110  Pulse: (!) 119 (!) 115  Resp: 20 18  Temp: 99.3 F (37.4 C) 98.8 F (37.1 C)  SpO2: 99% 97%    Physical Exam: Gen: alert, no acute distress, obese HEENT: anicteric sclera CV: RRR Chest: CTA B Abd: soft, nontender, nondistended, +BS Ext: no edema  Lab Results: Recent Labs    09/08/23 2131 09/08/23 2154 09/09/23 0403 09/10/23 0801  NA  --   --  133* 136  K  --   --  3.8 3.9  CL  --   --  103 103  CO2  --   --  17* 25  GLUCOSE  --   --  128* 121*  BUN  --   --  <5* <5*  CREATININE  --   --  0.72 0.58  CALCIUM  --   --  8.4* 8.4*  MG 1.6*  --   --  2.1  PHOS  --  2.3*  --  2.2*   Recent Labs    09/08/23 1340 09/10/23 0801  AST 204* 96*  ALT 39 26  ALKPHOS 54 42  BILITOT 1.9* 2.1*  PROT 7.7 6.3*  ALBUMIN 3.5 2.9*   Recent Labs    09/08/23 1340 09/09/23 0403 09/10/23 0801  WBC 4.5 4.2 4.5  NEUTROABS 1.9  --   --   HGB 15.4* 12.8 13.4  HCT 45.5 38.6 40.4  MCV 91.7 94.6 93.3  PLT 167 86* 133*      Assessment/Plan: Hematemesis and N/V - resolved. Tolerating liquid diet. Full liquids for lunch. Soft diet for dinner. Strongly encouraged to stop drinking. Change to PPI PO BID when on solid foods and continue as outpt for 4 weeks and then change to every day. F/U with GI in office prn. [facility] will sign off. Call if questions. Dr. Lavaughn Portland on tomorrow if needed.    Yvetta Herbert 09/10/2023, 11:15 AM  Questions please call [phone] ID: Lanette Pipe, female   DOB: Oct 15, 1985, 38 y.o.   MRN: [id number]

## 2023-09-10 NOTE — Progress Notes (Signed)
 PROGRESS NOTE    Deanna [name]  ZOX:[id number] DOB: December 03, 1985 DOA: 09/08/2023 PCP: Marcine Matar, MD    Brief Narrative:   38 year old with history of atrial fibrillation, HTN, alcohol use comes to the hospital with complaints of right upper quadrant abdominal pain and few episodes of vomiting and at times slightly bloody.  Right upper quadrant ultrasound unremarkable in the ER, admitted with concerns of hematemesis and alcohol withdrawal.  Assessment & Plan:  Principal Problem:   Hematemesis Active Problems:   Essential hypertension   Alcohol abuse   Hypokalemia   LFT elevation   Alcohol withdrawal (HCC)   Hematemesis Seen by [facility], no plans of endoscopic evaluation as long as no further bleeding and hemoglobin remained stable.  Continue PPI twice daily   Alcohol withdrawal (HCC) Alcohol withdrawal protocol in place.  Folic acid, multivitamin and thiamine. Librium taper  LFT elevation Chronic . Steatotsis seen on US liver.  Pending acute hepatitis panel  Thrombocytopenia - Likely from underlying etoh use.  We will closely monitor.   Hypokalemia/hypophosphatemia/hypomagnesemia Aggressive repletion   Alcohol abuse Counselled to quit as aptietn is haivng adverse effects.    DVT prophylaxis: SCDs Start: 09/08/23 1931    Code Status: Full Code Family Communication:   Continue hospital stay for alcohol withdrawal and hematemesis evaluation    Subjective: \Feels better no complaints.  No further bleeding this morning according to the patient. Examination:  General exam: Appears calm and comfortable  Respiratory system: Clear to auscultation. Respiratory effort normal. Cardiovascular system: Sinus tachycardia Gastrointestinal system: Abdomen is nondistended, soft and nontender. No organomegaly or masses felt. Normal bowel sounds heard. Central nervous system: Alert and oriented. No focal neurological deficits. Extremities: Symmetric 5 x 5 power. Skin: No  rashes, lesions or ulcers Psychiatry: Judgement and insight appear normal. Mood & affect appropriate.                Diet Orders (From admission, onward)     Start     Ordered   09/10/23 1400  DIET SOFT Fluid consistency: Thin  Diet effective 1400       Question:  Fluid consistency:  Answer:  Thin   09/10/23 1114   09/10/23 1014  Diet full liquid Room service appropriate? Yes; Fluid consistency: Thin  Diet effective now       Question Answer Comment  Room service appropriate? Yes   Fluid consistency: Thin      09/10/23 1013            Objective: Vitals:   09/10/23 0009 09/10/23 0252 09/10/23 0457 09/10/23 1034  BP: (!) 144/101 (!) 162/97 (!) 156/105 (!) 153/110  Pulse: (!) 116 (!) 121 (!) 119 (!) 115  Resp: 19 19 20 18   Temp: 98.8 F (37.1 C) 99.1 F (37.3 C) 99.3 F (37.4 C) 98.8 F (37.1 C)  TempSrc: Oral Oral Oral Oral  SpO2: 99% 99% 99% 97%  Weight:      Height:        Intake/Output Summary (Last 24 hours) at 09/10/2023 1146 Last data filed at 09/10/2023 0500 Gross per 24 hour  Intake 4013.49 ml  Output --  Net 4013.49 ml   Filed Weights   09/08/23 1340  Weight: 111.1 kg    Scheduled Meds:  chlordiazePOXIDE  25 mg Oral BID   Followed by   Melene Muller ON 09/11/2023] chlordiazePOXIDE  25 mg Oral Q1500   folic acid  1 mg Oral Daily   multivitamin with minerals  1  tablet Oral Daily   pantoprazole  40 mg Oral BID AC   pantoprazole (PROTONIX) IV  40 mg Intravenous Q12H   sodium chloride flush  3 mL Intravenous Q12H   thiamine  100 mg Oral Daily   Or   thiamine  100 mg Intravenous Daily   Continuous Infusions:  Nutritional status     Body mass index is 40.76 kg/m.  Data Reviewed:   CBC: Recent Labs  Lab 09/08/23 1340 09/09/23 0403 09/10/23 0801  WBC 4.5 4.2 4.5  NEUTROABS 1.9  --   --   HGB 15.4* 12.8 13.4  HCT 45.5 38.6 40.4  MCV 91.7 94.6 93.3  PLT 167 86* 133*   Basic Metabolic Panel: Recent Labs  Lab 09/08/23 1340  09/08/23 2131 09/08/23 2154 09/09/23 0403 09/10/23 0801  NA 140  --   --  133* 136  K 2.8*  --   --  3.8 3.9  CL 102  --   --  103 103  CO2 23  --   --  17* 25  GLUCOSE 91  --   --  128* 121*  BUN <5*  --   --  <5* <5*  CREATININE 0.54  --   --  0.72 0.58  CALCIUM 8.8*  --   --  8.4* 8.4*  MG  --  1.6*  --   --  2.1  PHOS  --   --  2.3*  --  2.2*   GFR: Estimated Creatinine Clearance: 120.6 mL/min (by C-G formula based on SCr of 0.58 mg/dL). Liver Function Tests: Recent Labs  Lab 09/08/23 1340 09/10/23 0801  AST 204* 96*  ALT 39 26  ALKPHOS 54 42  BILITOT 1.9* 2.1*  PROT 7.7 6.3*  ALBUMIN 3.5 2.9*   Recent Labs  Lab 09/08/23 1340  LIPASE 49   No results for input(s): "AMMONIA" in the last 168 hours. Coagulation Profile: Recent Labs  Lab 09/08/23 2131  INR 1.0   Cardiac Enzymes: No results for input(s): "CKTOTAL", "CKMB", "CKMBINDEX", "TROPONINI" in the last 168 hours. BNP (last 3 results) No results for input(s): "PROBNP" in the last 8760 hours. HbA1C: No results for input(s): "HGBA1C" in the last 72 hours. CBG: No results for input(s): "GLUCAP" in the last 168 hours. Lipid Profile: No results for input(s): "CHOL", "HDL", "LDLCALC", "TRIG", "CHOLHDL", "LDLDIRECT" in the last 72 hours. Thyroid Function Tests: Recent Labs    09/09/23 0403  TSH 0.917  FREET4 1.09   Anemia Panel: No results for input(s): "VITAMINB12", "FOLATE", "FERRITIN", "TIBC", "IRON", "RETICCTPCT" in the last 72 hours. Sepsis Labs: No results for input(s): "PROCALCITON", "LATICACIDVEN" in the last 168 hours.  No results found for this or any previous visit (from the past 240 hours).       Radiology Studies: US Abdomen Limited RUQ (LIVER/GB) Result Date: 09/08/2023 CLINICAL DATA:  Abdominal pain EXAM: ULTRASOUND ABDOMEN LIMITED RIGHT UPPER QUADRANT COMPARISON:  12/11/2022 FINDINGS: Gallbladder: Gallstones: None Sludge: None Gallbladder Wall: Within normal limits Pericholecystic  fluid: None Sonographic Murphy's Sign: Negative per technologist Common bile duct: Diameter: 5 mm Liver: Parenchymal echogenicity: Homogeneously increased Contours: Normal Lesions: None Portal vein: Patent.  Hepatopetal flow Other: None. IMPRESSION: Diffuse increased echogenicity of the hepatic parenchyma is a nonspecific indicator of hepatocellular dysfunction, most commonly steatosis. Electronically Signed   By: Acquanetta Belling M.D.   On: 09/08/2023 17:26           LOS: 2 days   Time spent= 35 mins  Maggie Schooner, MD [facility]  If 7PM-7AM, please contact night-coverage  09/10/2023, 11:46 AM

## 2023-09-11 ENCOUNTER — Other Ambulatory Visit: Payer: Self-pay

## 2023-09-11 DIAGNOSIS — K920 Hematemesis: Secondary | ICD-10-CM | POA: Diagnosis not present

## 2023-09-11 LAB — MISC LABCORP TEST (SEND OUT): Labcorp test code: 144000

## 2023-09-11 LAB — COMPREHENSIVE METABOLIC PANEL WITH GFR
ALT: 29 U/L (ref 0–44)
AST: 104 U/L — ABNORMAL HIGH (ref 15–41)
Albumin: 2.9 g/dL — ABNORMAL LOW (ref 3.5–5.0)
Alkaline Phosphatase: 42 U/L (ref 38–126)
Anion gap: 9 (ref 5–15)
BUN: 6 mg/dL (ref 6–20)
CO2: 23 mmol/L (ref 22–32)
Calcium: 8.4 mg/dL — ABNORMAL LOW (ref 8.9–10.3)
Chloride: 103 mmol/L (ref 98–111)
Creatinine, Ser: 0.44 mg/dL (ref 0.44–1.00)
GFR, Estimated: >60 mL/min (ref >60)
Glucose, Bld: 109 mg/dL — ABNORMAL HIGH (ref 70–99)
Potassium: 3.8 mmol/L (ref 3.5–5.1)
Sodium: 135 mmol/L (ref 135–145)
Total Bilirubin: 1.8 mg/dL — ABNORMAL HIGH (ref 0.0–1.2)
Total Protein: 6.3 g/dL — ABNORMAL LOW (ref 6.5–8.1)

## 2023-09-11 LAB — CBC
HCT: 41.8 % (ref 36.0–46.0)
Hemoglobin: 13.8 g/dL (ref 12.0–15.0)
MCH: 31.2 pg (ref 26.0–34.0)
MCHC: 33 g/dL (ref 30.0–36.0)
MCV: 94.6 fL (ref 80.0–100.0)
Platelets: 143 10*3/uL — ABNORMAL LOW (ref 150–400)
RBC: 4.42 MIL/uL (ref 3.87–5.11)
RDW: 14.1 % (ref 11.5–15.5)
WBC: 4.6 10*3/uL (ref 4.0–10.5)
nRBC: 0 % (ref 0.0–0.2)

## 2023-09-11 LAB — MAGNESIUM: Magnesium: 2.1 mg/dL (ref 1.7–2.4)

## 2023-09-11 MED ORDER — METOPROLOL TARTRATE 25 MG PO TABS
25.0000 mg | ORAL_TABLET | Freq: Two times a day (BID) | ORAL | 0 refills | Status: DC
  Filled 2023-09-11: qty 60, 30d supply, fill #0

## 2023-09-11 MED ORDER — METOPROLOL TARTRATE 25 MG PO TABS
25.0000 mg | ORAL_TABLET | Freq: Two times a day (BID) | ORAL | Status: DC
  Administered 2023-09-11: 25 mg via ORAL
  Filled 2023-09-11: qty 1

## 2023-09-11 MED ORDER — FOLIC ACID 1 MG PO TABS
1.0000 mg | ORAL_TABLET | Freq: Every day | ORAL | 0 refills | Status: DC
  Filled 2023-09-11: qty 30, 30d supply, fill #0

## 2023-09-11 MED ORDER — VITAMIN B-1 100 MG PO TABS
100.0000 mg | ORAL_TABLET | Freq: Every day | ORAL | 0 refills | Status: AC
  Filled 2023-09-11: qty 30, 30d supply, fill #0

## 2023-09-11 MED ORDER — ADULT MULTIVITAMIN W/MINERALS CH: 1.0000 | ORAL_TABLET | Freq: Every day | ORAL | Status: AC

## 2023-09-11 MED ORDER — PANTOPRAZOLE SODIUM 40 MG PO TBEC
40.0000 mg | DELAYED_RELEASE_TABLET | Freq: Two times a day (BID) | ORAL | 0 refills | Status: DC
  Filled 2023-09-11: qty 120, 60d supply, fill #0

## 2023-09-11 NOTE — Discharge Summary (Signed)
 Physician Discharge Summary  Deanna [name]:[id number] DOB: February 15, 1986 DOA: 09/08/2023  PCP: Lawrance Presume, MD  Admit date: 09/08/2023 Discharge date: 09/11/2023  Admitted From: Home Disposition: Home  Recommendations for Outpatient Follow-up:  Follow up with PCP in 1-2 weeks Please obtain BMP/CBC in one week your next doctors visit.  Folic acid , thiamine  prescribed.  Multivitamin over-the-counter Metoprolol  25 mg twice daily for sinus tachycardia   Discharge Condition: Stable CODE STATUS: Full code Diet recommendation: Regular  Brief/Interim Summary: Brief Narrative:   38 year old with history of atrial fibrillation, HTN, alcohol use comes to the hospital with complaints of right upper quadrant abdominal pain and few episodes of vomiting and at times slightly bloody.  Right upper quadrant ultrasound unremarkable in the ER, admitted with concerns of hematemesis and alcohol withdrawal. During the hospitalization hematemesis resolved with conservative management, hemoglobin remained stable.  GI did not recommend any further endoscopic evaluation and deferred this outpatient if it becomes necessary in the future. Completed Librium  taper, will be prescribed folic acid , thiamine  and multivitamin.  Metoprolol  twice daily was started for tachycardia. Today she is medically stable for discharge as she is feeling significantly well.  Assessment & Plan:  Principal Problem:   Hematemesis Active Problems:   Essential hypertension   Alcohol abuse   Hypokalemia   LFT elevation   Alcohol withdrawal (HCC)   Hematemesis Resolved, seen by [facility].  Hemoglobin remained stable 13.0.  Continue PPI   Alcohol withdrawal (HCC) Alcohol withdrawal protocol in place.  Folic acid , multivitamin and thiamine . Librium  taper completed.  Counseled to quit using alcohol  Sinus tachycardia - Appears to be now chronic.  Will start metoprolol  25 mg twice daily  LFT elevation Chronic . Steatotsis  seen on US  liver.  Acute otitis panel is negative  Thrombocytopenia - Likely from underlying etoh use.  We will closely monitor.   Hypokalemia/hypophosphatemia/hypomagnesemia Aggressive repletion   Alcohol abuse Counselled to quit as aptietn is haivng adverse effects.    DVT prophylaxis: SCDs Start: 09/08/23 1931    Code Status: Full Code Family Communication:   Discharge today  Subjective: Feels better, wishing to go home. Examination:  General exam: Appears calm and comfortable  Respiratory system: Clear to auscultation. Respiratory effort normal. Cardiovascular system: Sinus tachycardia Gastrointestinal system: Abdomen is nondistended, soft and nontender. No organomegaly or masses felt. Normal bowel sounds heard. Central nervous system: Alert and oriented. No focal neurological deficits. Extremities: Symmetric 5 x 5 power. Skin: No rashes, lesions or ulcers Psychiatry: Judgement and insight appear normal. Mood & affect appropriate.    Discharge Diagnoses:  Principal Problem:   Hematemesis Active Problems:   Essential hypertension   Alcohol abuse   Hypokalemia   LFT elevation   Alcohol withdrawal (HCC)      Discharge Exam: Vitals:   09/11/23 0901 09/11/23 0957  BP: (!) 158/116 (!) 149/109  Pulse: (!) 119   Resp: 17 20  Temp: 98.8 F (37.1 C)   SpO2: 99%    Vitals:   09/11/23 0108 09/11/23 0507 09/11/23 0901 09/11/23 0957  BP: (!) 152/108 (!) 145/112 (!) 158/116 (!) 149/109  Pulse:  (!) 116 (!) 119   Resp: 18 18 17 20   Temp: 98.8 F (37.1 C) 99.1 F (37.3 C) 98.8 F (37.1 C)   TempSrc: Oral Oral Oral   SpO2: 99% 100% 99%   Weight:      Height:          Discharge Instructions   Allergies as of 09/11/2023  No Known Allergies      Medication List     STOP taking these medications    diltiazem  180 MG 24 hr capsule Commonly known as: Cartia  XT       TAKE these medications    folic acid  1 MG tablet Commonly known as:  FOLVITE  Take 1 tablet (1 mg total) by mouth daily. Start taking on: September 12, 2023   medroxyPROGESTERone  150 MG/ML injection Commonly known as: DEPO-PROVERA  Inject 150 mg into the muscle every 3 (three) months.   metoprolol  tartrate 25 MG tablet Commonly known as: LOPRESSOR  Take 1 tablet (25 mg total) by mouth 2 (two) times daily.   multivitamin with minerals Tabs tablet Take 1 tablet by mouth daily. Start taking on: September 12, 2023   pantoprazole  40 MG tablet Commonly known as: PROTONIX  Take 1 tablet (40 mg total) by mouth 2 (two) times daily before a meal. Take 1 tablet by mouth twice daily for 2 months then decrease to one tablet one time daily. What changed:  when to take this additional instructions   thiamine  100 MG tablet Commonly known as: Vitamin B-1 Take 1 tablet (100 mg total) by mouth daily. Start taking on: September 12, 2023   valsartan  40 MG tablet Commonly known as: DIOVAN  Take 1 tablet (40 mg total) by mouth daily.        Follow-up Information     Lawrance Presume, MD Follow up in 1 week(s).   Specialty: Internal Medicine Contact information: [address] [place] [address] [phone]                No Known Allergies  You were cared for by a hospitalist during your hospital stay. If you have any questions about your discharge medications or the care you received while you were in the hospital after you are discharged, you can call the unit and asked to speak with the hospitalist on call if the hospitalist that took care of you is not available. Once you are discharged, your primary care physician will handle any further medical issues. Please note that no refills for any discharge medications will be authorized once you are discharged, as it is imperative that you return to your primary care physician (or establish a relationship with a primary care physician if you do not have one) for your aftercare needs so that they can  reassess your need for medications and monitor your lab values.  You were cared for by a hospitalist during your hospital stay. If you have any questions about your discharge medications or the care you received while you were in the hospital after you are discharged, you can call the unit and asked to speak with the hospitalist on call if the hospitalist that took care of you is not available. Once you are discharged, your primary care physician will handle any further medical issues. Please note that NO REFILLS for any discharge medications will be authorized once you are discharged, as it is imperative that you return to your primary care physician (or establish a relationship with a primary care physician if you do not have one) for your aftercare needs so that they can reassess your need for medications and monitor your lab values.  Please request your Prim.MD to go over all Hospital Tests and Procedure/Radiological results at the follow up, please get all Hospital records sent to your Prim MD by signing hospital release before you go home.  Get CBC, CMP, 2 view  Chest X ray checked  by Primary MD during your next visit or SNF MD in 5-7 days ( we routinely change or add medications that can affect your baseline labs and fluid status, therefore we recommend that you get the mentioned basic workup next visit with your PCP, your PCP may decide not to get them or add new tests based on their clinical decision)  On your next visit with your primary care physician please Get Medicines reviewed and adjusted.  If you experience worsening of your admission symptoms, develop shortness of breath, life threatening emergency, suicidal or homicidal thoughts you must seek medical attention immediately by calling 911 or calling your MD immediately  if symptoms less severe.  You Must read complete instructions/literature along with all the possible adverse reactions/side effects for all the Medicines you take and  that have been prescribed to you. Take any new Medicines after you have completely understood and accpet all the possible adverse reactions/side effects.   Do not drive, operate heavy machinery, perform activities at heights, swimming or participation in water activities or provide baby sitting services if your were admitted for syncope or siezures until you have seen by Primary MD or a Neurologist and advised to do so again.  Do not drive when taking Pain medications.   Procedures/Studies: US  Abdomen Limited RUQ (LIVER/GB) Result Date: 09/08/2023 CLINICAL DATA:  Abdominal pain EXAM: ULTRASOUND ABDOMEN LIMITED RIGHT UPPER QUADRANT COMPARISON:  12/11/2022 FINDINGS: Gallbladder: Gallstones: None Sludge: None Gallbladder Wall: Within normal limits Pericholecystic fluid: None Sonographic Murphy's Sign: Negative per technologist Common bile duct: Diameter: 5 mm Liver: Parenchymal echogenicity: Homogeneously increased Contours: Normal Lesions: None Portal vein: Patent.  Hepatopetal flow Other: None. IMPRESSION: Diffuse increased echogenicity of the hepatic parenchyma is a nonspecific indicator of hepatocellular dysfunction, most commonly steatosis. Electronically Signed   By: Elester Grim M.D.   On: 09/08/2023 17:26     The results of significant diagnostics from this hospitalization (including imaging, microbiology, ancillary and laboratory) are listed below for reference.     Microbiology: No results found for this or any previous visit (from the past 240 hours).   Labs: BNP (last 3 results) No results for input(s): "BNP" in the last 8760 hours. Basic Metabolic Panel: Recent Labs  Lab 09/08/23 1340 09/08/23 2131 09/08/23 2154 09/09/23 0403 09/10/23 0801 09/11/23 0356  NA 140  --   --  133* 136 135  K 2.8*  --   --  3.8 3.9 3.8  CL 102  --   --  103 103 103  CO2 23  --   --  17* 25 23  GLUCOSE 91  --   --  128* 121* 109*  BUN <5*  --   --  <5* <5* 6  CREATININE 0.54  --   --  0.72  0.58 0.44  CALCIUM 8.8*  --   --  8.4* 8.4* 8.4*  MG  --  1.6*  --   --  2.1 2.1  PHOS  --   --  2.3*  --  2.2*  --    Liver Function Tests: Recent Labs  Lab 09/08/23 1340 09/10/23 0801 09/11/23 0356  AST 204* 96* 104*  ALT 39 26 29  ALKPHOS 54 42 42  BILITOT 1.9* 2.1* 1.8*  PROT 7.7 6.3* 6.3*  ALBUMIN 3.5 2.9* 2.9*   Recent Labs  Lab 09/08/23 1340  LIPASE 49   No results for input(s): "AMMONIA" in the last 168 hours. CBC: Recent Labs  Lab  09/08/23 1340 09/09/23 0403 09/10/23 0801 09/11/23 0356  WBC 4.5 4.2 4.5 4.6  NEUTROABS 1.9  --   --   --   HGB 15.4* 12.8 13.4 13.8  HCT 45.5 38.6 40.4 41.8  MCV 91.7 94.6 93.3 94.6  PLT 167 86* 133* 143*   Cardiac Enzymes: No results for input(s): "CKTOTAL", "CKMB", "CKMBINDEX", "TROPONINI" in the last 168 hours. BNP: Invalid input(s): "POCBNP" CBG: No results for input(s): "GLUCAP" in the last 168 hours. D-Dimer No results for input(s): "DDIMER" in the last 72 hours. Hgb A1c No results for input(s): "HGBA1C" in the last 72 hours. Lipid Profile No results for input(s): "CHOL", "HDL", "LDLCALC", "TRIG", "CHOLHDL", "LDLDIRECT" in the last 72 hours. Thyroid  function studies Recent Labs    09/09/23 0403  TSH 0.917   Anemia work up No results for input(s): "VITAMINB12", "FOLATE", "FERRITIN", "TIBC", "IRON", "RETICCTPCT" in the last 72 hours. Urinalysis    Component Value Date/Time   COLORURINE Deanna [name] (A) 09/08/2023 1340   APPEARANCEUR CLOUDY (A) 09/08/2023 1340   LABSPEC 1.017 09/08/2023 1340   PHURINE 6.0 09/08/2023 1340   GLUCOSEU NEGATIVE 09/08/2023 1340   HGBUR NEGATIVE 09/08/2023 1340   BILIRUBINUR NEGATIVE 09/08/2023 1340   KETONESUR NEGATIVE 09/08/2023 1340   PROTEINUR NEGATIVE 09/08/2023 1340   UROBILINOGEN >=8.0 09/04/2021 1816   NITRITE NEGATIVE 09/08/2023 1340   LEUKOCYTESUR NEGATIVE 09/08/2023 1340   Sepsis Labs Recent Labs  Lab 09/08/23 1340 09/09/23 0403 09/10/23 0801 09/11/23 0356  WBC 4.5  4.2 4.5 4.6   Microbiology No results found for this or any previous visit (from the past 240 hours).   Time coordinating discharge:  I have spent 35 minutes face to face with the patient and on the ward discussing the patients care, assessment, plan and disposition with other care givers. >50% of the time was devoted counseling the patient about the risks and benefits of treatment/Discharge disposition and coordinating care.   SIGNED:   Maggie Schooner, MD  [facility] 09/11/2023, 12:36 PM   If 7PM-7AM, please contact night-coverage

## 2023-09-11 NOTE — Progress Notes (Signed)
   09/11/23 0901  Assess: MEWS Score  Temp 98.8 F (37.1 C)  BP (!) 158/116  MAP (mmHg) 129  Pulse Rate (!) 119  ECG Heart Rate (!) 119  Resp 17  Level of Consciousness Alert  SpO2 99 %  O2 Device Room Air  Assess: MEWS Score  MEWS Temp 0  MEWS Systolic 0  MEWS Pulse 2  MEWS RR 0  MEWS LOC 0  MEWS Score 2  MEWS Score Color Yellow  Assess: if the MEWS score is Yellow or Red  Were vital signs accurate and taken at a resting state? Yes  Does the patient meet 2 or more of the SIRS criteria? No  MEWS guidelines implemented  No, previously yellow, continue vital signs every 4 hours  Notify: Charge Nurse/RN  Name of Charge Nurse/RN Notified Rainell Buoy, RN  Provider Notification  Provider Name/Title Rand Burrs, MD  Date Provider Notified 09/11/23  Time Provider Notified [phone]  Method of Notification Page  Notification Reason Other (Comment) (elevated HR)  Provider response No new orders (RN gave scheduled BP med and will recheck)  Date of Provider Response 09/11/23  Time of Provider Response 0941  Assess: SIRS CRITERIA  SIRS Temperature  0  SIRS Respirations  0  SIRS Pulse 1  SIRS WBC 0  SIRS Score Sum  1   MD already aware of elevated HR.

## 2023-09-11 NOTE — Progress Notes (Signed)
 Discharge instructions given to patient questions asked and answered. Sylvia Everts RN

## 2023-09-11 NOTE — Progress Notes (Signed)
 MD notified that patient's BP was better BP 149/109 and heart rate was better HR 106 after metoprolol . MD said patient was okay to go home.

## 2023-09-14 ENCOUNTER — Telehealth: Payer: Self-pay

## 2023-09-14 NOTE — Transitions of Care (Post Inpatient/ED Visit) (Signed)
   09/14/2023  Name: Deanna [name] MRN: [id number] DOB: 1986/01/15  Today's TOC FU Call Status: Today's TOC FU Call Status:: Unsuccessful Call (1st Attempt) Unsuccessful Call (1st Attempt) Date: 09/14/23  Attempted to reach the patient regarding the most recent Inpatient/ED visit.  Follow Up Plan: Additional outreach attempts will be made to reach the patient to complete the Transitions of Care (Post Inpatient/ED visit) call.   Signature  Burnett Carson, RN

## 2023-09-15 ENCOUNTER — Telehealth: Payer: Self-pay

## 2023-09-15 NOTE — Transitions of Care (Post Inpatient/ED Visit) (Signed)
   09/15/2023  Name: Deanna [name] MRN: [id number] DOB: Oct 07, 1985  Today's TOC FU Call Status: Today's TOC FU Call Status:: Unsuccessful Call (2nd Attempt) Unsuccessful Call (1st Attempt) Date: 09/14/23 Unsuccessful Call (2nd Attempt) Date: 09/15/23  Attempted to reach the patient regarding the most recent Inpatient/ED visit.  Follow Up Plan: Additional outreach attempts will be made to reach the patient to complete the Transitions of Care (Post Inpatient/ED visit) call.   Signature Burnett Carson, RN

## 2023-09-16 ENCOUNTER — Telehealth: Payer: Self-pay

## 2023-09-16 NOTE — Transitions of Care (Post Inpatient/ED Visit) (Signed)
   09/16/2023  Name: Deanna [name] MRN: [id number] DOB: May 02, 1986  Today's TOC FU Call Status: Today's TOC FU Call Status:: Unsuccessful Call (3rd Attempt) Unsuccessful Call (1st Attempt) Date: 09/14/23 Unsuccessful Call (2nd Attempt) Date: 09/15/23 Unsuccessful Call (3rd Attempt) Date: 09/16/23  Attempted to reach the patient regarding the most recent Inpatient/ED visit.  Follow Up Plan: No further outreach attempts will be made at this time. We have been unable to contact the patient.  Letter sent to patient requesting she contact [facility] to schedule a follow up appointment as we have not been able to reach her.   Signature  Burnett Carson, RN

## 2023-09-22 DIAGNOSIS — F1010 Alcohol abuse, uncomplicated: Secondary | ICD-10-CM | POA: Insufficient documentation

## 2024-01-27 ENCOUNTER — Other Ambulatory Visit: Payer: Self-pay | Admitting: Internal Medicine

## 2024-01-27 ENCOUNTER — Other Ambulatory Visit: Payer: Self-pay

## 2024-01-27 MED ORDER — METOPROLOL TARTRATE 25 MG PO TABS
25.0000 mg | ORAL_TABLET | Freq: Two times a day (BID) | ORAL | 0 refills | Status: DC
  Filled 2024-01-27: qty 60, 30d supply, fill #0

## 2024-01-28 ENCOUNTER — Other Ambulatory Visit: Payer: Self-pay

## 2024-04-07 ENCOUNTER — Other Ambulatory Visit: Payer: Self-pay

## 2024-04-07 ENCOUNTER — Encounter (HOSPITAL_COMMUNITY): Payer: Self-pay

## 2024-04-07 ENCOUNTER — Inpatient Hospital Stay (HOSPITAL_COMMUNITY)
Admission: EM | Admit: 2024-04-07 | Discharge: 2024-04-13 | DRG: 439 | Disposition: A | Discharge status: Home / Self Care | Attending: Emergency Medicine | Admitting: Emergency Medicine

## 2024-04-07 ENCOUNTER — Emergency Department (HOSPITAL_COMMUNITY)

## 2024-04-07 DIAGNOSIS — I10 Essential (primary) hypertension: Secondary | ICD-10-CM | POA: Diagnosis present

## 2024-04-07 DIAGNOSIS — Z6838 Body mass index (BMI) 38.0-38.9, adult: Secondary | ICD-10-CM | POA: Diagnosis not present

## 2024-04-07 DIAGNOSIS — Z8249 Family history of ischemic heart disease and other diseases of the circulatory system: Secondary | ICD-10-CM

## 2024-04-07 DIAGNOSIS — K852 Alcohol induced acute pancreatitis without necrosis or infection: Secondary | ICD-10-CM | POA: Diagnosis present

## 2024-04-07 DIAGNOSIS — E860 Dehydration: Secondary | ICD-10-CM | POA: Diagnosis present

## 2024-04-07 DIAGNOSIS — I480 Paroxysmal atrial fibrillation: Secondary | ICD-10-CM | POA: Diagnosis present

## 2024-04-07 DIAGNOSIS — K5903 Drug induced constipation: Secondary | ICD-10-CM | POA: Diagnosis present

## 2024-04-07 DIAGNOSIS — F10939 Alcohol use, unspecified with withdrawal, unspecified: Secondary | ICD-10-CM | POA: Diagnosis present

## 2024-04-07 DIAGNOSIS — D696 Thrombocytopenia, unspecified: Secondary | ICD-10-CM | POA: Diagnosis not present

## 2024-04-07 DIAGNOSIS — E876 Hypokalemia: Secondary | ICD-10-CM | POA: Diagnosis present

## 2024-04-07 DIAGNOSIS — E66812 Obesity, class 2: Secondary | ICD-10-CM

## 2024-04-07 DIAGNOSIS — F1010 Alcohol abuse, uncomplicated: Secondary | ICD-10-CM | POA: Diagnosis present

## 2024-04-07 DIAGNOSIS — N179 Acute kidney failure, unspecified: Secondary | ICD-10-CM | POA: Diagnosis not present

## 2024-04-07 DIAGNOSIS — T40695A Adverse effect of other narcotics, initial encounter: Secondary | ICD-10-CM | POA: Diagnosis present

## 2024-04-07 DIAGNOSIS — R17 Unspecified jaundice: Secondary | ICD-10-CM | POA: Diagnosis present

## 2024-04-07 DIAGNOSIS — Z833 Family history of diabetes mellitus: Secondary | ICD-10-CM

## 2024-04-07 DIAGNOSIS — E669 Obesity, unspecified: Secondary | ICD-10-CM | POA: Diagnosis present

## 2024-04-07 LAB — LIPASE, BLOOD: Lipase: 794 U/L — ABNORMAL HIGH (ref 11–51)

## 2024-04-07 LAB — CBC WITH DIFFERENTIAL/PLATELET
Abs Immature Granulocytes: 0.03 K/uL (ref 0.00–0.07)
Basophils Absolute: 0 K/uL (ref 0.0–0.1)
Basophils Relative: 0 %
Eosinophils Absolute: 0 K/uL (ref 0.0–0.5)
Eosinophils Relative: 0 %
HCT: 51 % — ABNORMAL HIGH (ref 36.0–46.0)
Hemoglobin: 17.4 g/dL — ABNORMAL HIGH (ref 12.0–15.0)
Immature Granulocytes: 0 %
Lymphocytes Relative: 9 %
Lymphs Abs: 1 K/uL (ref 0.7–4.0)
MCH: 30.9 pg (ref 26.0–34.0)
MCHC: 34.1 g/dL (ref 30.0–36.0)
MCV: 90.4 fL (ref 80.0–100.0)
Monocytes Absolute: 0.9 K/uL (ref 0.1–1.0)
Monocytes Relative: 8 %
Neutro Abs: 9.1 K/uL — ABNORMAL HIGH (ref 1.7–7.7)
Neutrophils Relative %: 83 %
Platelets: 292 K/uL (ref 150–400)
RBC: 5.64 MIL/uL — ABNORMAL HIGH (ref 3.87–5.11)
RDW: 17.3 % — ABNORMAL HIGH (ref 11.5–15.5)
WBC: 11.2 K/uL — ABNORMAL HIGH (ref 4.0–10.5)
nRBC: 0 % (ref 0.0–0.2)

## 2024-04-07 LAB — I-STAT CG4 LACTIC ACID, ED: Lactic Acid, Venous: 1.9 mmol/L (ref 0.5–1.9)

## 2024-04-07 LAB — PHOSPHORUS: Phosphorus: 3.3 mg/dL (ref 2.5–4.6)

## 2024-04-07 LAB — COMPREHENSIVE METABOLIC PANEL WITH GFR
ALT: 32 U/L (ref 0–44)
AST: 126 U/L — ABNORMAL HIGH (ref 15–41)
Albumin: 4 g/dL (ref 3.5–5.0)
Alkaline Phosphatase: 104 U/L (ref 38–126)
Anion gap: 18 — ABNORMAL HIGH (ref 5–15)
BUN: 8 mg/dL (ref 6–20)
CO2: 20 mmol/L — ABNORMAL LOW (ref 22–32)
Calcium: 9.8 mg/dL (ref 8.9–10.3)
Chloride: 102 mmol/L (ref 98–111)
Creatinine, Ser: 0.83 mg/dL (ref 0.44–1.00)
GFR, Estimated: >60 mL/min (ref >60)
Glucose, Bld: 134 mg/dL — ABNORMAL HIGH (ref 70–99)
Potassium: 3.5 mmol/L (ref 3.5–5.1)
Sodium: 139 mmol/L (ref 135–145)
Total Bilirubin: 2.1 mg/dL — ABNORMAL HIGH (ref 0.0–1.2)
Total Protein: 8.4 g/dL — ABNORMAL HIGH (ref 6.5–8.1)

## 2024-04-07 LAB — PROTIME-INR
INR: 1 (ref 0.8–1.2)
Prothrombin Time: 13.4 s (ref 11.4–15.2)

## 2024-04-07 LAB — MAGNESIUM: Magnesium: 1.6 mg/dL — ABNORMAL LOW (ref 1.7–2.4)

## 2024-04-07 LAB — HCG, SERUM, QUALITATIVE: Preg, Serum: NEGATIVE

## 2024-04-07 MED ORDER — PANTOPRAZOLE SODIUM 40 MG IV SOLR
40.0000 mg | INTRAVENOUS | Status: DC
  Administered 2024-04-08 – 2024-04-11 (×4): 40 mg via INTRAVENOUS
  Filled 2024-04-07 (×4): qty 10

## 2024-04-07 MED ORDER — LORAZEPAM 2 MG/ML IJ SOLN: 1.0000 mg | INTRAMUSCULAR | Status: AC | PRN

## 2024-04-07 MED ORDER — THIAMINE MONONITRATE 100 MG PO TABS
100.0000 mg | ORAL_TABLET | Freq: Every day | ORAL | Status: DC
  Administered 2024-04-07 – 2024-04-13 (×7): 100 mg via ORAL
  Filled 2024-04-07 (×7): qty 1

## 2024-04-07 MED ORDER — ONDANSETRON HCL 4 MG PO TABS
4.0000 mg | ORAL_TABLET | Freq: Four times a day (QID) | ORAL | Status: DC | PRN
  Administered 2024-04-10: 4 mg via ORAL
  Filled 2024-04-07: qty 1

## 2024-04-07 MED ORDER — MAGNESIUM SULFATE 2 GM/50ML IV SOLN
2.0000 g | Freq: Once | INTRAVENOUS | Status: AC
  Administered 2024-04-07: 2 g via INTRAVENOUS
  Filled 2024-04-07: qty 50

## 2024-04-07 MED ORDER — THIAMINE HCL 100 MG/ML IJ SOLN
100.0000 mg | Freq: Every day | INTRAMUSCULAR | Status: DC
  Filled 2024-04-07: qty 2

## 2024-04-07 MED ORDER — IOHEXOL 300 MG/ML  SOLN
100.0000 mL | Freq: Once | INTRAMUSCULAR | Status: AC | PRN
  Administered 2024-04-07: 100 mL via INTRAVENOUS

## 2024-04-07 MED ORDER — PANTOPRAZOLE SODIUM 40 MG IV SOLR
40.0000 mg | Freq: Once | INTRAVENOUS | Status: AC
  Administered 2024-04-07: 40 mg via INTRAVENOUS
  Filled 2024-04-07: qty 10

## 2024-04-07 MED ORDER — METOPROLOL TARTRATE 25 MG PO TABS
25.0000 mg | ORAL_TABLET | Freq: Two times a day (BID) | ORAL | Status: DC
  Administered 2024-04-07 – 2024-04-11 (×8): 25 mg via ORAL
  Filled 2024-04-07 (×8): qty 1

## 2024-04-07 MED ORDER — ACETAMINOPHEN 650 MG RE SUPP: 650.0000 mg | Freq: Four times a day (QID) | RECTAL | Status: DC | PRN

## 2024-04-07 MED ORDER — MORPHINE SULFATE (PF) 4 MG/ML IV SOLN
4.0000 mg | Freq: Once | INTRAVENOUS | Status: AC
  Administered 2024-04-07: 4 mg via INTRAVENOUS
  Filled 2024-04-07: qty 1

## 2024-04-07 MED ORDER — LORAZEPAM 1 MG PO TABS: 1.0000 mg | ORAL_TABLET | ORAL | Status: AC | PRN

## 2024-04-07 MED ORDER — HYDROMORPHONE HCL 1 MG/ML IJ SOLN
1.0000 mg | Freq: Once | INTRAMUSCULAR | Status: DC
  Filled 2024-04-07: qty 1

## 2024-04-07 MED ORDER — LACTATED RINGERS IV SOLN: INTRAVENOUS | Status: AC

## 2024-04-07 MED ORDER — SORBITOL 70 % SOLN: 30.0000 mL | Freq: Every day | Status: DC | PRN

## 2024-04-07 MED ORDER — ENOXAPARIN SODIUM 60 MG/0.6ML IJ SOSY
55.0000 mg | PREFILLED_SYRINGE | INTRAMUSCULAR | Status: DC
  Administered 2024-04-07 – 2024-04-08 (×2): 55 mg via SUBCUTANEOUS
  Filled 2024-04-07 (×3): qty 0.6

## 2024-04-07 MED ORDER — SODIUM CHLORIDE 0.9 % IV BOLUS
1000.0000 mL | Freq: Once | INTRAVENOUS | Status: AC
  Administered 2024-04-07: 1000 mL via INTRAVENOUS

## 2024-04-07 MED ORDER — SODIUM CHLORIDE 0.9% FLUSH
3.0000 mL | Freq: Two times a day (BID) | INTRAVENOUS | Status: DC
  Administered 2024-04-07 – 2024-04-13 (×11): 3 mL via INTRAVENOUS

## 2024-04-07 MED ORDER — ONDANSETRON HCL 4 MG/2ML IJ SOLN
4.0000 mg | Freq: Once | INTRAMUSCULAR | Status: AC
  Administered 2024-04-07: 4 mg via INTRAVENOUS
  Filled 2024-04-07: qty 2

## 2024-04-07 MED ORDER — HYDROMORPHONE HCL 1 MG/ML IJ SOLN: 0.5000 mg | INTRAMUSCULAR | Status: DC | PRN

## 2024-04-07 MED ORDER — METOPROLOL TARTRATE 25 MG PO TABS: 25.0000 mg | ORAL_TABLET | Freq: Two times a day (BID) | ORAL | Status: DC

## 2024-04-07 MED ORDER — HYDROMORPHONE HCL 1 MG/ML IJ SOLN
1.0000 mg | INTRAMUSCULAR | Status: DC | PRN
  Administered 2024-04-07 – 2024-04-08 (×4): 1 mg via INTRAVENOUS
  Filled 2024-04-07 (×3): qty 1

## 2024-04-07 MED ORDER — HYDRALAZINE HCL 20 MG/ML IJ SOLN
5.0000 mg | Freq: Four times a day (QID) | INTRAMUSCULAR | Status: DC | PRN
  Administered 2024-04-08: 5 mg via INTRAVENOUS
  Filled 2024-04-07 (×2): qty 1

## 2024-04-07 MED ORDER — FOLIC ACID 1 MG PO TABS
1.0000 mg | ORAL_TABLET | Freq: Every day | ORAL | Status: DC
  Administered 2024-04-07 – 2024-04-13 (×7): 1 mg via ORAL
  Filled 2024-04-07 (×7): qty 1

## 2024-04-07 MED ORDER — SODIUM CHLORIDE 0.9 % IV BOLUS
2000.0000 mL | Freq: Once | INTRAVENOUS | Status: AC
  Administered 2024-04-07: 2000 mL via INTRAVENOUS

## 2024-04-07 MED ORDER — ACETAMINOPHEN 500 MG PO TABS: 500.0000 mg | ORAL_TABLET | Freq: Two times a day (BID) | ORAL | Status: DC | PRN

## 2024-04-07 MED ORDER — ONDANSETRON HCL 4 MG/2ML IJ SOLN
4.0000 mg | Freq: Four times a day (QID) | INTRAMUSCULAR | Status: DC | PRN
  Administered 2024-04-08: 4 mg via INTRAVENOUS
  Filled 2024-04-07: qty 2

## 2024-04-07 MED ORDER — ADULT MULTIVITAMIN W/MINERALS CH
1.0000 | ORAL_TABLET | Freq: Every day | ORAL | Status: DC
  Administered 2024-04-07 – 2024-04-13 (×7): 1 via ORAL
  Filled 2024-04-07 (×7): qty 1

## 2024-04-07 NOTE — Progress Notes (Signed)
 ICM consulted for substance abuse resources Resources attached to AVS. No additional ICM needs.

## 2024-04-07 NOTE — H&P (Addendum)
 History and Physical    Patient: Deanna [name] DOB: 1986/01/15 DOA: 04/07/2024 DOS: the patient was seen and examined on 04/07/2024 PCP: Vicci Barnie NOVAK, MD  Patient coming from: Home  Chief Complaint:  Chief Complaint  Patient presents with   Abdominal Pain   HPI: Deanna [name] is a 38 y.o. female with medical history significant for hypertension, paroxysmal atrial fibrillation, hematemesis and alcohol abuse.  The patient has had 1 episode of alcoholic pancreatitis in the past.  She presents today with severe 10 out of 10 abdominal pain.  The pain is all over her abdomen and worse with movement.  She last had a dose of alcohol yesterday and the pain intensified so she has not touched it since then.  Denies fever or vomiting or diarrhea.  She had this once several years ago but it was not as severe at that time.  She quit drinking for a month after she had pancreatitis in the past. In the emergency department the patient had a CT scan of her abdomen and pelvis which confirmed pancreatitis.  There was no evidence of pseudocyst.  Her blood work revealed a low magnesium  of 1.6 and a potassium of 3.5.  Her hemoglobin is elevated at 17.4.  Her white count is 11.2. Her lipase is 794.  Calcium is 9.8.   Review of Systems: As mentioned in the history of present illness. All other systems reviewed and are negative. Past Medical History:  Diagnosis Date   Abnormal Pap smear    Atrial fibrillation (HCC)    Hypertension    Infection    UTI   Normal pregnancy 10/04/2011   Obese    SVD (spontaneous vaginal delivery) 10/04/2011   Trichomonas    Vaginal Pap smear, abnormal    Past Surgical History:  Procedure Laterality Date   LEEP     TOOTH EXTRACTION     Social History:  reports that she has never smoked. She has never used smokeless tobacco. She reports current alcohol use. She reports that she does not use drugs.  No Known Allergies  Family History  Problem Relation  Age of Onset   Diabetes Maternal Grandfather    Hypertension Mother    Diabetes Mother    Hypertension Father    Diabetes Sister    Hypertension Sister    Sarcoidosis Brother    Anesthesia problems Neg Hx    Hypotension Neg Hx    Malignant hyperthermia Neg Hx    Pseudochol deficiency Neg Hx     Prior to Admission medications   Medication Sig Start Date End Date Taking? Authorizing Provider  folic acid  (FOLVITE ) 1 MG tablet Take 1 tablet (1 mg total) by mouth daily. 09/12/23   Amin, Ankit C, MD  medroxyPROGESTERone  (DEPO-PROVERA ) 150 MG/ML injection Inject 150 mg into the muscle every 3 (three) months.    [provider]  metoprolol  tartrate (LOPRESSOR ) 25 MG tablet Take 1 tablet (25 mg total) by mouth 2 (two) times daily. 01/27/24   Vicci Barnie NOVAK, MD  Multiple Vitamin (MULTIVITAMIN WITH MINERALS) TABS tablet Take 1 tablet by mouth daily. 09/12/23   Amin, Ankit C, MD  pantoprazole  (PROTONIX ) 40 MG tablet Take 1 tablet (40 mg total) by mouth 2 (two) times daily before a meal. Take 1 tablet by mouth twice daily for 2 months then decrease to one tablet one time daily. 09/11/23 11/10/23  Amin, Ankit C, MD  thiamine  (VITAMIN B-1) 100 MG tablet Take 1 tablet (100 mg  total) by mouth daily. 09/12/23   Amin, Ankit C, MD  valsartan  (DIOVAN ) 40 MG tablet Take 1 tablet (40 mg total) by mouth daily. 07/14/23   Vicci Barnie NOVAK, MD    Physical Exam: Vitals:   04/07/24 1606 04/07/24 1627 04/07/24 1715 04/07/24 1731  BP: (!) 153/107 (!) 141/103 (!) 144/93   Pulse: (!) 136 (!) 130 (!) 118   Resp: 19  (!) 21   Temp:      TempSrc:      SpO2: 94%  96%   Weight:    110.7 kg  Height:    5' 5 (1.651 m)   Physical Exam:  General: No acute distress, well developed, well nourished HEENT: Normocephalic, atraumatic, PERRL Cardiovascular: Normal rate and rhythm. Distal pulses intact. Pulmonary: Normal pulmonary effort, normal breath sounds Gastrointestinal: Nondistended abdomen, soft, very  tender to light touch diffusely, hypoactive bowel sounds Musculoskeletal:Normal ROM, no lower ext edema Lymphadenopathy: No cervical LAD. Skin: Skin is warm and dry. Neuro: No focal deficits noted, AAOx3. PSYCH: Attentive and cooperative  Data Reviewed:  Results for orders placed or performed during the hospital encounter of 04/07/24 (from the past 24 hours)  Culture, blood (Routine x 2)     Status: None (Preliminary result)   Collection Time: 04/07/24  3:40 PM   Specimen: BLOOD RIGHT ARM  Result Value Ref Range   Specimen Description      BLOOD RIGHT ARM Performed at [facility], [address], [place], [place] [address]    Special Requests      BOTTLES DRAWN AEROBIC AND ANAEROBIC Blood Culture results may not be optimal due to an inadequate volume of blood received in culture bottles Performed at [facility], [address], [place], [place] [address]    Culture PENDING    Report Status PENDING   Comprehensive metabolic panel     Status: Abnormal   Collection Time: 04/07/24  3:53 PM  Result Value Ref Range   Sodium 139 135 - 145 mmol/L   Potassium 3.5 3.5 - 5.1 mmol/L   Chloride 102 98 - 111 mmol/L   CO2 20 (L) 22 - 32 mmol/L   Glucose, Bld 134 (H) 70 - 99 mg/dL   BUN 8 6 - 20 mg/dL   Creatinine, Ser 9.16 0.44 - 1.00 mg/dL   Calcium 9.8 8.9 - 89.6 mg/dL   Total Protein 8.4 (H) 6.5 - 8.1 g/dL   Albumin 4.0 3.5 - 5.0 g/dL   AST 873 (H) 15 - 41 U/L   ALT 32 0 - 44 U/L   Alkaline Phosphatase 104 38 - 126 U/L   Total Bilirubin 2.1 (H) 0.0 - 1.2 mg/dL   GFR, Estimated >39 >39 mL/min   Anion gap 18 (H) 5 - 15  CBC with Differential     Status: Abnormal   Collection Time: 04/07/24  3:53 PM  Result Value Ref Range   WBC 11.2 (H) 4.0 - 10.5 K/uL   RBC 5.64 (H) 3.87 - 5.11 MIL/uL   Hemoglobin 17.4 (H) 12.0 - 15.0 g/dL   HCT 48.9 (H) 63.9 - 53.9 %   MCV 90.4 80.0 - 100.0 fL   MCH 30.9 26.0 - 34.0 pg   MCHC 34.1 30.0 - 36.0 g/dL   RDW 82.6 (H)  88.4 - 15.5 %   Platelets 292 150 - 400 K/uL   nRBC 0.0 0.0 - 0.2 %   Neutrophils Relative % 83 %   Neutro Abs 9.1 (H)  1.7 - 7.7 K/uL   Lymphocytes Relative 9 %   Lymphs Abs 1.0 0.7 - 4.0 K/uL   Monocytes Relative 8 %   Monocytes Absolute 0.9 0.1 - 1.0 K/uL   Eosinophils Relative 0 %   Eosinophils Absolute 0.0 0.0 - 0.5 K/uL   Basophils Relative 0 %   Basophils Absolute 0.0 0.0 - 0.1 K/uL   Immature Granulocytes 0 %   Abs Immature Granulocytes 0.03 0.00 - 0.07 K/uL  Protime-INR     Status: None   Collection Time: 04/07/24  3:53 PM  Result Value Ref Range   Prothrombin Time 13.4 11.4 - 15.2 seconds   INR 1.0 0.8 - 1.2  hCG, serum, qualitative     Status: None   Collection Time: 04/07/24  3:53 PM  Result Value Ref Range   Preg, Serum NEGATIVE NEGATIVE  Lipase, blood     Status: Abnormal   Collection Time: 04/07/24  3:53 PM  Result Value Ref Range   Lipase 794 (H) 11 - 51 U/L  Magnesium      Status: Abnormal   Collection Time: 04/07/24  3:53 PM  Result Value Ref Range   Magnesium  1.6 (L) 1.7 - 2.4 mg/dL  Phosphorus     Status: None   Collection Time: 04/07/24  3:53 PM  Result Value Ref Range   Phosphorus 3.3 2.5 - 4.6 mg/dL  I-Stat Lactic Acid, ED     Status: None   Collection Time: 04/07/24  4:01 PM  Result Value Ref Range   Lactic Acid, Venous 1.9 0.5 - 1.9 mmol/L     Assessment and Plan: #1.  Alcoholic pancreatitis -IV fluids, n.p.o. except ice chips, pain control #2 hypomagnesemia and hypokalemia -will replete both with a goal potassium of 4.0 #3 Hypertension -resume metoprolol .  She did not take it this morning #4 alcohol abuse -continued counseling needed - CIWA protocol.  She tells me her withdrawal in the past has only consisted of shakes.  No history of seizures or DTs. #5 hemoconcentration and dehydration -the patient's elevated hemoglobin is likely reflective of this.  IV fluids. Monitor #6  History of paroxysmal atrial fibrillation - not on anticoagulation.   She is currently in sinus tach   Advance Care Planning:   Code Status: Prior  The patient names her mother as her surrogate decision maker and she wants to be full code Consults: None  Family Communication: Daughter is at bedside  Severity of Illness: The appropriate patient status for this patient is INPATIENT. Inpatient status is judged to be reasonable and necessary in order to provide the required intensity of service to ensure the patient's safety. The patient's presenting symptoms, physical exam findings, and initial radiographic and laboratory data in the context of their chronic comorbidities is felt to place them at high risk for further clinical deterioration. Furthermore, it is not anticipated that the patient will be medically stable for discharge from the hospital within 2 midnights of admission.   * I certify that at the point of admission it is my clinical judgment that the patient will require inpatient hospital care spanning beyond 2 midnights from the point of admission due to high intensity of service, high risk for further deterioration and high frequency of surveillance required.*  Author: ARTHEA CHILD, MD 04/07/2024 6:08 PM  For on call review [email or web address].

## 2024-04-07 NOTE — ED Triage Notes (Addendum)
 Pt arrives via EMS from home. PT c/o abdominal pain, nausea, and vomiting for the past couple of days. PT is tachycardic during triage. She is AxOx4.

## 2024-04-07 NOTE — ED Provider Notes (Signed)
 [place] EMERGENCY DEPARTMENT AT [facility] Provider Note   CSN: [id number] Arrival date & time: 04/07/24  1517     Patient presents with: Abdominal Pain   Deanna [name] is a 38 y.o. female.   The history is provided by the patient and medical records. No language interpreter was used.  Abdominal Pain    38 year old female with significant history of alcohol abuse, CHF, paroxysmal atrial fibrillation, hypertension brought here via EMS from home with complaint of abdominal pain.  Patient endorsed having diffuse abdominal discomfort that started since yesterday.  Pain is described as a sharp stabbing sensation, that is becoming progressively worse.  She also endorsed feeling nauseous, has vomited multiple episodes throughout the day.  Endorsing chills without fever.  No chest pain or shortness of breath no productive cough no urinary discomfort no vaginal bleeding or vaginal discharge.  She is unsure her last menstruation.  She admits to heavy alcohol use and last use was yesterday.  States she has been admitted to the hospital due to having similar abdominal pain like this in the past.  She denies any marijuana use or tobacco use.  She denies being diagnosed with pancreatitis in the past.  Prior to Admission medications   Medication Sig Start Date End Date Taking? Authorizing Provider  folic acid  (FOLVITE ) 1 MG tablet Take 1 tablet (1 mg total) by mouth daily. 09/12/23   Amin, Ankit C, MD  medroxyPROGESTERone  (DEPO-PROVERA ) 150 MG/ML injection Inject 150 mg into the muscle every 3 (three) months.    [provider]  metoprolol  tartrate (LOPRESSOR ) 25 MG tablet Take 1 tablet (25 mg total) by mouth 2 (two) times daily. 01/27/24   Vicci Barnie NOVAK, MD  Multiple Vitamin (MULTIVITAMIN WITH MINERALS) TABS tablet Take 1 tablet by mouth daily. 09/12/23   Amin, Ankit C, MD  pantoprazole  (PROTONIX ) 40 MG tablet Take 1 tablet (40 mg total) by mouth 2 (two) times daily before a  meal. Take 1 tablet by mouth twice daily for 2 months then decrease to one tablet one time daily. 09/11/23 11/10/23  Amin, Ankit C, MD  thiamine  (VITAMIN B-1) 100 MG tablet Take 1 tablet (100 mg total) by mouth daily. 09/12/23   Amin, Ankit C, MD  valsartan  (DIOVAN ) 40 MG tablet Take 1 tablet (40 mg total) by mouth daily. 07/14/23   Vicci Barnie NOVAK, MD    Allergies: Patient has no known allergies.    Review of Systems  Gastrointestinal:  Positive for abdominal pain.  All other systems reviewed and are negative.   Updated Vital Signs BP (!) 159/131 (BP Location: Left Arm)   Pulse (!) 148   Temp 98.3 F (36.8 C) (Oral)   Resp (!) 22   SpO2 94%   Physical Exam Vitals and nursing note reviewed.  Constitutional:      General: She is not in acute distress.    Appearance: She is well-developed. She is obese.     Comments: Patient appears uncomfortable.  HENT:     Head: Atraumatic.  Eyes:     Conjunctiva/sclera: Conjunctivae normal.  Cardiovascular:     Rate and Rhythm: Tachycardia present.     Pulses: Normal pulses.     Heart sounds: Normal heart sounds.  Pulmonary:     Effort: Pulmonary effort is normal.     Breath sounds: No wheezing, rhonchi or rales.  Abdominal:     Palpations: Abdomen is soft.     Tenderness: There is abdominal tenderness (Diffuse abdominal  tenderness on gentle palpation.).  Musculoskeletal:     Cervical back: Neck supple.  Skin:    Findings: No rash.  Neurological:     Mental Status: She is alert.  Psychiatric:        Mood and Affect: Mood normal.     (all labs ordered are listed, but only abnormal results are displayed) Labs Reviewed  COMPREHENSIVE METABOLIC PANEL WITH GFR - Abnormal; Notable for the following components:      Result Value   CO2 20 (*)    Glucose, Bld 134 (*)    Total Protein 8.4 (*)    AST 126 (*)    Total Bilirubin 2.1 (*)    Anion gap 18 (*)    All other components within normal limits  CBC WITH DIFFERENTIAL/PLATELET -  Abnormal; Notable for the following components:   WBC 11.2 (*)    RBC 5.64 (*)    Hemoglobin 17.4 (*)    HCT 51.0 (*)    RDW 17.3 (*)    Neutro Abs 9.1 (*)    All other components within normal limits  LIPASE, BLOOD - Abnormal; Notable for the following components:   Lipase 794 (*)    All other components within normal limits  MAGNESIUM  - Abnormal; Notable for the following components:   Magnesium  1.6 (*)    All other components within normal limits  CULTURE, BLOOD (ROUTINE X 2)  CULTURE, BLOOD (ROUTINE X 2)  PROTIME-INR  HCG, SERUM, QUALITATIVE  PHOSPHORUS  URINALYSIS, W/ REFLEX TO CULTURE (INFECTION SUSPECTED)  I-STAT CG4 LACTIC ACID, ED    EKG: None  Radiology: CT ABDOMEN PELVIS W CONTRAST Result Date: 04/07/2024 CLINICAL DATA:  Acute abdominal pain. EXAM: CT ABDOMEN AND PELVIS WITH CONTRAST TECHNIQUE: Multidetector CT imaging of the abdomen and pelvis was performed using the standard protocol following bolus administration of intravenous contrast. RADIATION DOSE REDUCTION: This exam was performed according to the departmental dose-optimization program which includes automated exposure control, adjustment of the mA and/or kV according to patient size and/or use of iterative reconstruction technique. CONTRAST:  100mL OMNIPAQUE IOHEXOL 300 MG/ML  SOLN COMPARISON:  None Available. FINDINGS: Lower chest: No acute abnormality. Hepatobiliary: No focal liver abnormality is seen. No gallstones, gallbladder wall thickening, or biliary dilatation. There is diffuse fatty infiltration of the liver. Pancreas: There is fat stranding, fluid and inflammation surrounding the pancreatic head suspicious for acute pancreatitis. No pancreatic dilatation or discrete fluid collection identified at this time. Spleen: Normal in size without focal abnormality. Adrenals/Urinary Tract: Adrenal glands are unremarkable. Kidneys are normal, without renal calculi, focal lesion, or hydronephrosis. Bladder is  unremarkable. Stomach/Bowel: There is a small hiatal hernia. There is wall thickening and inflammation of the duodenal as it approximates the pancreatic head. There is no perforation. There is no pneumatosis or free air. There is no bowel obstruction. The appendix appears normal. Vascular/Lymphatic: No significant vascular findings are present. No enlarged abdominal or pelvic lymph nodes. Reproductive: Uterus and bilateral adnexa are unremarkable. Other: There is a moderate amount of free fluid in the pelvis. There is central mesenteric haziness. There is no focal abdominal wall hernia. Musculoskeletal: No acute or significant osseous findings. IMPRESSION: 1. Findings compatible with acute pancreatitis. No discrete fluid collection identified at this time. 2. Wall thickening and inflammation of the duodenum as it approximates the pancreatic head, likely reactive. 3. Moderate amount of free fluid in the pelvis. 4. Fatty infiltration of the liver. 5. Small hiatal hernia. Electronically Signed   By: Amy  Maple M.D.   On: 04/07/2024 17:22     Procedures   Medications Ordered in the ED  LORazepam  (ATIVAN ) tablet 1-4 mg (has no administration in time range)    Or  LORazepam  (ATIVAN ) injection 1-4 mg (has no administration in time range)  thiamine  (VITAMIN B1) tablet 100 mg (100 mg Oral Given 04/07/24 1629)    Or  thiamine  (VITAMIN B1) injection 100 mg ( Intravenous See Alternative 04/07/24 1629)  folic acid  (FOLVITE ) tablet 1 mg (1 mg Oral Given 04/07/24 1629)  multivitamin with minerals tablet 1 tablet (1 tablet Oral Given 04/07/24 1629)  sodium chloride  0.9 % bolus 2,000 mL (2,000 mLs Intravenous New Bag/Given 04/07/24 1632)  morphine  (PF) 4 MG/ML injection 4 mg (4 mg Intravenous Given 04/07/24 1636)  ondansetron  (ZOFRAN ) injection 4 mg (4 mg Intravenous Given 04/07/24 1636)  pantoprazole  (PROTONIX ) injection 40 mg (40 mg Intravenous Given 04/07/24 1636)  iohexol (OMNIPAQUE) 300 MG/ML solution 100  mL (100 mLs Intravenous Contrast Given 04/07/24 1651)                                    Medical Decision Making Amount and/or Complexity of Data Reviewed Labs: ordered. Radiology: ordered.  Risk OTC drugs. Prescription drug management.   BP (!) 153/107   Pulse (!) 136   Temp 98.3 F (36.8 C) (Oral)   Resp 19   SpO2 94%   68:25 PM  38 year old female with significant history of alcohol abuse, CHF, paroxysmal atrial fibrillation, hypertension brought here via EMS from home with complaint of abdominal pain.  Patient endorsed having diffuse abdominal discomfort that started since yesterday.  Pain is described as a sharp stabbing sensation, that is becoming progressively worse.  She also endorsed feeling nauseous, has vomited multiple episodes throughout the day.  Endorsing chills without fever.  No chest pain or shortness of breath no productive cough no urinary discomfort no vaginal bleeding or vaginal discharge.  She is unsure her last menstruation.  She admits to heavy alcohol use and last use was yesterday.  States she has been admitted to the hospital due to having similar abdominal pain like this in the past.  She denies any marijuana use or tobacco use.  She denies being diagnosed with pancreatitis in the past.  On exam patient appears uncomfortable.  She is tachycardic.  She has diffuse abdominal tenderness to gentle palpation.  Vital signs show normal heart rate of 148.  She is tachypneic.  Stable blood pressure, no fever no hypoxia.  -Labs ordered, independently viewed and interpreted by me.  Labs remarkable for elevated lipase of 794 suggestive of pancreatitis.  Mag is 1.6.  Elevated AST of 126 normal ALT of 32, total bili is 2.1.  Normal lactic acid.  White count is 11.2.  Hemoglobin of 17.4 likely hemoconcentration. -The patient was maintained on a cardiac monitor.  I personally viewed and interpreted the cardiac monitored which showed an underlying rhythm of: Sinus  tachycardia -Imaging independently viewed and interpreted by me and I agree with radiologist's interpretation.  Result remarkable for abdominal pelvis CT scan did demonstrate finding consistent with acute pancreatitis without any evidence of abscess.  Patient does have wall thickening and inflammations of the duodenum likely reactive. -This patient presents to the ED for concern of abdominal pain, this involves an extensive number of treatment options, and is a complaint that carries with it a high risk of complications and  morbidity.  The differential diagnosis includes pancreatitis, cholecystitis, gastritis, GERD, appendicitis, colitis, ischemic bowel, -Co morbidities that complicate the patient evaluation includes alcohol abuse, CHF, paroxysmal A-fib  -Treatment includes morphine , Zofran , Protonix , IV fluid, thiamine , folate -Reevaluation of the patient after these medicines showed that the patient improved -PCP office notes or outside notes reviewed -Discussion with specialist triad hospitalist Dr. Claireborne who agrees to admit pt -Escalation to admission/observation considered: patient is agreeable with admission. PT placed on CIWA protocol to decrease risk of alcohol withdrawal.     Final diagnoses:  Alcohol-induced acute pancreatitis without infection or necrosis    ED Discharge Orders     None          Nivia Colon, PA-C 04/07/24 1802    Deanna Lavonia SAILOR, MD 04/08/24 [phone]

## 2024-04-07 NOTE — ED Notes (Signed)
 Urine collection was clicked off in error - it has not yet been collected.

## 2024-04-07 NOTE — ED Notes (Signed)
 Pt states she takes 10 shots of liquor per day. Last drink was yesterday night.

## 2024-04-07 NOTE — ED Notes (Signed)
 Patient transported to CT

## 2024-04-08 ENCOUNTER — Inpatient Hospital Stay (HOSPITAL_COMMUNITY)

## 2024-04-08 DIAGNOSIS — K852 Alcohol induced acute pancreatitis without necrosis or infection: Secondary | ICD-10-CM | POA: Diagnosis not present

## 2024-04-08 DIAGNOSIS — I480 Paroxysmal atrial fibrillation: Secondary | ICD-10-CM | POA: Diagnosis not present

## 2024-04-08 DIAGNOSIS — I10 Essential (primary) hypertension: Secondary | ICD-10-CM | POA: Diagnosis not present

## 2024-04-08 LAB — URINALYSIS, W/ REFLEX TO CULTURE (INFECTION SUSPECTED)
Bacteria, UA: NONE SEEN
Bilirubin Urine: NEGATIVE
Glucose, UA: NEGATIVE mg/dL
Hgb urine dipstick: NEGATIVE
Ketones, ur: NEGATIVE mg/dL
Leukocytes,Ua: NEGATIVE
Nitrite: NEGATIVE
Protein, ur: 100 mg/dL — AB
Specific Gravity, Urine: 1.015 (ref 1.005–1.030)
pH: 5 (ref 5.0–8.0)

## 2024-04-08 LAB — COMPREHENSIVE METABOLIC PANEL WITH GFR
ALT: 19 U/L (ref 0–44)
AST: 59 U/L — ABNORMAL HIGH (ref 15–41)
Albumin: 3.3 g/dL — ABNORMAL LOW (ref 3.5–5.0)
Alkaline Phosphatase: 75 U/L (ref 38–126)
Anion gap: 9 (ref 5–15)
BUN: 13 mg/dL (ref 6–20)
CO2: 26 mmol/L (ref 22–32)
Calcium: 8.7 mg/dL — ABNORMAL LOW (ref 8.9–10.3)
Chloride: 104 mmol/L (ref 98–111)
Creatinine, Ser: 1.3 mg/dL — ABNORMAL HIGH (ref 0.44–1.00)
GFR, Estimated: 54 mL/min — ABNORMAL LOW (ref >60)
Glucose, Bld: 107 mg/dL — ABNORMAL HIGH (ref 70–99)
Potassium: 4.1 mmol/L (ref 3.5–5.1)
Sodium: 139 mmol/L (ref 135–145)
Total Bilirubin: 2.7 mg/dL — ABNORMAL HIGH (ref 0.0–1.2)
Total Protein: 6.6 g/dL (ref 6.5–8.1)

## 2024-04-08 LAB — CBC
HCT: 42 % (ref 36.0–46.0)
Hemoglobin: 14 g/dL (ref 12.0–15.0)
MCH: 31.3 pg (ref 26.0–34.0)
MCHC: 33.3 g/dL (ref 30.0–36.0)
MCV: 93.8 fL (ref 80.0–100.0)
Platelets: 147 K/uL — ABNORMAL LOW (ref 150–400)
RBC: 4.48 MIL/uL (ref 3.87–5.11)
RDW: 17.2 % — ABNORMAL HIGH (ref 11.5–15.5)
WBC: 13.2 K/uL — ABNORMAL HIGH (ref 4.0–10.5)
nRBC: 0 % (ref 0.0–0.2)

## 2024-04-08 LAB — HIV ANTIBODY (ROUTINE TESTING W REFLEX): HIV Screen 4th Generation wRfx: NONREACTIVE

## 2024-04-08 LAB — MAGNESIUM: Magnesium: 2.2 mg/dL (ref 1.7–2.4)

## 2024-04-08 MED ORDER — OXYCODONE HCL 5 MG PO TABS
5.0000 mg | ORAL_TABLET | ORAL | Status: DC | PRN
  Administered 2024-04-08 – 2024-04-09 (×3): 10 mg via ORAL
  Administered 2024-04-09: 5 mg via ORAL
  Administered 2024-04-10: 10 mg via ORAL
  Filled 2024-04-08 (×2): qty 2
  Filled 2024-04-08: qty 1
  Filled 2024-04-08 (×2): qty 2

## 2024-04-08 MED ORDER — HYDROMORPHONE HCL 1 MG/ML IJ SOLN
0.5000 mg | INTRAMUSCULAR | Status: DC | PRN
  Administered 2024-04-08 – 2024-04-12 (×12): 1 mg via INTRAVENOUS
  Filled 2024-04-08 (×13): qty 1

## 2024-04-08 NOTE — Plan of Care (Signed)
  Problem: Clinical Measurements: Goal: Will remain free from infection Outcome: Progressing Goal: Cardiovascular complication will be avoided Outcome: Progressing   Problem: Activity: Goal: Risk for activity intolerance will decrease Outcome: Progressing   Problem: Safety: Goal: Ability to remain free from injury will improve Outcome: Progressing   Problem: Skin Integrity: Goal: Risk for impaired skin integrity will decrease Outcome: Progressing   Problem: Nutrition Goal: Patient maintains adequate hydration Outcome: Progressing

## 2024-04-08 NOTE — TOC Initial Note (Signed)
 Transition of Care [facility]) - Initial/Assessment Note    Patient Details  Name: Deanna [name] MRN: [id number] Date of Birth: April 27, 1986  Transition of Care [facility] CM/SW Contact:    Doneta Glenys DASEN, RN Phone Number: 04/08/2024, 4:50 PM  Clinical Narrative:                 Presented for c/o abdominal pain, nausea, and vomiting. PTA lives in a mobile home with her brother and 2 minor children;verified PCP/insurance;denies DME,HH,oxygen and SDOH needs; patients best friend Frederick Dose [phone] will transport at discharge. Patient agreeable to allowing CM to add alcohol abuse resources to AVS.  Expected Discharge Plan: Home/Self Care Barriers to Discharge: No Barriers Identified   Patient Goals and CMS Choice Patient states their goals for this hospitalization and ongoing recovery are:: Home CMS Medicare.gov Compare Post Acute Care list provided to::  (NA) Choice offered to / list presented to : NA Intercourse ownership interest in [facility].provided to:: Parent NA    Expected Discharge Plan and Services In-house Referral: NA Discharge Planning Services: CM Consult Post Acute Care Choice: NA Living arrangements for the past 2 months: Mobile Home                 DME Arranged: N/A DME Agency: NA       HH Arranged: NA HH Agency: NA        Prior Living Arrangements/Services Living arrangements for the past 2 months: Mobile Home Lives with:: Siblings, Minor Children Patient language and need for interpreter reviewed:: Yes Do you feel safe going back to the place where you live?: Yes      Need for Family Participation in Patient Care: No (Comment) Care giver support system in place?: Yes (comment) Current home services:  (NA) Criminal Activity/Legal Involvement Pertinent to Current Situation/Hospitalization: No - Comment as needed  Activities of Daily Living   ADL Screening (condition at time of admission) Independently performs ADLs?: Yes (appropriate for  developmental age) Is the patient deaf or have difficulty hearing?: No Does the patient have difficulty seeing, even when wearing glasses/contacts?: No Does the patient have difficulty concentrating, remembering, or making decisions?: No  Permission Sought/Granted Permission sought to share information with : Case Manager Permission granted to share information with : Yes, Verbal Permission Granted  Share Information with NAME: Roselee Dose (best friend) [phone]           Emotional Assessment Appearance:: Appears stated age Attitude/Demeanor/Rapport: Engaged Affect (typically observed): Appropriate Orientation: : Oriented to Self, Oriented to Place, Oriented to  Time, Oriented to Situation Alcohol / Substance Use: Alcohol Use Psych Involvement: No (comment)  Admission diagnosis:  Acute alcoholic pancreatitis [K85.20] Alcohol-induced acute pancreatitis without infection or necrosis [K85.20] Patient Active Problem List   Diagnosis Date Noted   Acute alcoholic pancreatitis 04/07/2024   Harmful use of alcohol 09/22/2023   Hematemesis 09/08/2023   LFT elevation 09/08/2023   Alcohol withdrawal (HCC) 09/08/2023   Alcohol abuse 12/11/2022   Chronic diastolic CHF (congestive heart failure) (HCC) 12/11/2022   Hypokalemia 12/11/2022   Upper GI bleed 12/10/2022   Abnormal cervical Papanicolaou smear 08/09/2020   Irregular periods 08/09/2020   Vaginal discharge 08/09/2020   Psychophysiological insomnia 08/09/2020   Essential hypertension 07/26/2019   PAF (paroxysmal atrial fibrillation) (HCC) 07/26/2019   Exposure to syphilis 11/04/2017   Obesity 10/12/2013   PCP:  Vicci Barnie NOVAK, MD Pharmacy:   [phone] [place], [place] - [address] [place] [address] Phone: [phone] Fax: [phone]  [facility] [address] [place] [address] Phone: [phone] Fax:  [phone]  [id number] [place], [place] - [address] [place] [address] Phone: [phone] Fax: [phone]  [facility] [address], [address] [place] [address] Phone: [phone] Fax: [phone]     Social Drivers of Health (SDOH) Social History: SDOH Screenings   Food Insecurity: No Food Insecurity (04/07/2024)  Housing: Low Risk  (04/07/2024)  Transportation Needs: No Transportation Needs (04/07/2024)  Utilities: Not At Risk (04/07/2024)  Alcohol Screen: Low Risk  (07/14/2023)  Depression (PHQ2-9): Low Risk  (07/14/2023)  Financial Resource Strain: Low Risk  (07/14/2023)  Physical Activity: Inactive (07/14/2023)  Social Connections: Socially Isolated (07/14/2023)  Stress: No Stress Concern Present (07/14/2023)  Tobacco Use: Low Risk  (04/07/2024)  Health Literacy: Adequate Health Literacy (07/14/2023)   SDOH Interventions:     Readmission Risk Interventions    04/08/2024    4:47 PM  Readmission Risk Prevention Plan  Post Dischage Appt Complete  Medication Screening Complete  Transportation Screening Complete

## 2024-04-08 NOTE — Plan of Care (Signed)

## 2024-04-08 NOTE — Progress Notes (Signed)
 Progress Note   Patient: Deanna [name] FMW:[id number] DOB: 1985-12-08 DOA: 04/07/2024     1 DOS: the patient was seen and examined on 04/08/2024   Brief hospital course:  Per H&P: Deanna [name] is a 38 y.o. female with medical history significant for hypertension, paroxysmal atrial fibrillation, hematemesis and alcohol abuse.  The patient has had 1 episode of alcoholic pancreatitis in the past.  She presents today with severe 10 out of 10 abdominal pain.  The pain is all over her abdomen and worse with movement.  She last had a dose of alcohol yesterday and the pain intensified so she has not touched it since then.  Denies fever or vomiting or diarrhea.  She had this once several years ago but it was not as severe at that time.  She quit drinking for a month after she had pancreatitis in the past. In the emergency department the patient had a CT scan of her abdomen and pelvis which confirmed pancreatitis.  There was no evidence of pseudocyst.  Her blood work revealed a low magnesium  of 1.6 and a potassium of 3.5.  Her hemoglobin is elevated at 17.4.  Her white count is 11.2. Her lipase is 794.  Calcium is 9.8  Patient was admitted on the evening of 04/07/2024 for further evaluation and management of uncomplicated acute pancreatitis as outlined in detail below.  Assessment and Plan:  Alcoholic pancreatitis - -- Continue IV fluids -- Clear liquid diet trial for today  -- IV antiemetics -- Add p.o. oxycodone  for pain if tolerating p.o., IV Dilaudid for breakthrough pain  Transaminitis, Hyperbilirubinemia --Likely from alcohol, but rule out gallstone pancreatitis --Check RUQ U/S --NPO after midnight for U/S --Trend LFT's  AKI - not POA, Cr was 0.83 >> 1.30 today. Pt did get IV contrast for CT yesterday. --Continue IV fluids --Monitor BMP --Avoid nephrotoxins and renally dose meds  Hypomagnesemia -resolved with replacement Hypokalemia -resolved with replacement --Monitor and  replace electrolytes  Hypertension  -- Continue metoprolol   Alcohol abuse -on admission, patient denied prior history of withdrawal seizures or DTs. --Monitoring on CIWA protocol with as needed Ativan    Hemoconcentration and dehydration -initial hemoglobin elevated at 17.4, improved with IV fluids to normal, 14.0 today  -- Monitor CBC  History of paroxysmal atrial fibrillation - not on anticoagulation.  Has been in sinus tachycardia.   --Continue metoprolol       Subjective: Patient seen with her mother at bedside today on rounds.  She reports ongoing epigastric and right sided abdominal pain nausea without vomiting.  She states pain has been the primary symptom, some nausea and vomited once yesterday.  Agreeable to try clear liquids today.  No other acute complaints.  Physical Exam: Vitals:   04/08/24 1433 04/08/24 1555 04/08/24 1637 04/08/24 1719  BP: (!) 187/122 (!) 184/122 (!) 170/102 (!) 172/108  Pulse: (!) 105 (!) 105 (!) 109 (!) 109  Resp:      Temp:      TempSrc:      SpO2:      Weight:      Height:       General exam: awake, alert, no acute distress HEENT: atraumatic, clear conjunctiva, anicteric sclera, moist mucus membranes, hearing grossly normal  Respiratory system: CTAB, no wheezes, rales or rhonchi, normal respiratory effort. Cardiovascular system: normal S1/S2, tachycardic, regular rhythm Gastrointestinal system: soft, non-distended, epigastric and right sided tenderness without rebound or guarding Central nervous system: A&O x 3. no gross focal neurologic deficits, normal speech  Extremities: moves all , no edema, normal tone Skin: dry, intact, normal temperature, normal color, No rashes, lesions or ulcers Psychiatry: normal mood, congruent affect, judgement and insight appear normal  Data Reviewed:  Notable labs --  Glucose 107 Cr 0.83 >> 1.30 Ca 8.7 Albumin 3.3 AST improved 126 >> 59 T bili 2.1 >> 2.7 Bicarb normalized 20 >> 26 Gap 18 >> 9 Mg 1.6  >> 2.2 normalized   Family Communication: Mother at bedside on rounds  Disposition: Status is: Inpatient Remains inpatient appropriate because: on IV therapies as above, ongoing evaluation   Planned Discharge Destination: Home    Time spent: 45 minutes  Author: Burnard DELENA Cunning, DO 04/08/2024 5:54 PM  For on call review [email or web address]

## 2024-04-09 DIAGNOSIS — K852 Alcohol induced acute pancreatitis without necrosis or infection: Secondary | ICD-10-CM | POA: Diagnosis not present

## 2024-04-09 LAB — COMPREHENSIVE METABOLIC PANEL WITH GFR
ALT: 12 U/L (ref 0–44)
AST: 39 U/L (ref 15–41)
Albumin: 3.1 g/dL — ABNORMAL LOW (ref 3.5–5.0)
Alkaline Phosphatase: 65 U/L (ref 38–126)
Anion gap: 9 (ref 5–15)
BUN: 12 mg/dL (ref 6–20)
CO2: 25 mmol/L (ref 22–32)
Calcium: 8.9 mg/dL (ref 8.9–10.3)
Chloride: 98 mmol/L (ref 98–111)
Creatinine, Ser: 0.92 mg/dL (ref 0.44–1.00)
GFR, Estimated: >60 mL/min (ref >60)
Glucose, Bld: 98 mg/dL (ref 70–99)
Potassium: 4 mmol/L (ref 3.5–5.1)
Sodium: 133 mmol/L — ABNORMAL LOW (ref 135–145)
Total Bilirubin: 2.5 mg/dL — ABNORMAL HIGH (ref 0.0–1.2)
Total Protein: 6.4 g/dL — ABNORMAL LOW (ref 6.5–8.1)

## 2024-04-09 LAB — CBC
HCT: 39.7 % (ref 36.0–46.0)
Hemoglobin: 13.6 g/dL (ref 12.0–15.0)
MCH: 31.7 pg (ref 26.0–34.0)
MCHC: 34.3 g/dL (ref 30.0–36.0)
MCV: 92.5 fL (ref 80.0–100.0)
Platelets: 96 K/uL — ABNORMAL LOW (ref 150–400)
RBC: 4.29 MIL/uL (ref 3.87–5.11)
RDW: 17 % — ABNORMAL HIGH (ref 11.5–15.5)
WBC: 12.9 K/uL — ABNORMAL HIGH (ref 4.0–10.5)
nRBC: 0.2 % (ref 0.0–0.2)

## 2024-04-09 LAB — URINE CULTURE

## 2024-04-09 MED ORDER — SENNOSIDES-DOCUSATE SODIUM 8.6-50 MG PO TABS
1.0000 | ORAL_TABLET | Freq: Two times a day (BID) | ORAL | Status: DC
  Administered 2024-04-09 – 2024-04-12 (×6): 1 via ORAL
  Filled 2024-04-09 (×9): qty 1

## 2024-04-09 MED ORDER — BISACODYL 5 MG PO TBEC
5.0000 mg | DELAYED_RELEASE_TABLET | Freq: Every day | ORAL | Status: DC | PRN
Start: 2024-04-09 — End: 2024-04-13
  Administered 2024-04-09: 5 mg via ORAL
  Filled 2024-04-09: qty 1

## 2024-04-09 MED ORDER — POLYETHYLENE GLYCOL 3350 17 G PO PACK
17.0000 g | PACK | Freq: Every day | ORAL | Status: DC
  Administered 2024-04-09 – 2024-04-10 (×2): 17 g via ORAL
  Filled 2024-04-09 (×5): qty 1

## 2024-04-09 MED ORDER — MAGNESIUM HYDROXIDE 400 MG/5ML PO SUSP: 30.0000 mL | Freq: Every day | ORAL | Status: DC | PRN

## 2024-04-09 MED ORDER — HYDROXYZINE HCL 25 MG PO TABS
25.0000 mg | ORAL_TABLET | Freq: Three times a day (TID) | ORAL | Status: DC | PRN
  Administered 2024-04-09 – 2024-04-10 (×3): 25 mg via ORAL
  Filled 2024-04-09 (×3): qty 1

## 2024-04-09 MED ORDER — SODIUM CHLORIDE 0.9 % IV SOLN: INTRAVENOUS | Status: AC

## 2024-04-09 NOTE — Progress Notes (Addendum)
 Progress Note   Patient: Deanna [name] FMW:[id number] DOB: [birth year] DOA: 04/07/2024     2 DOS: the patient was seen and examined on 04/09/2024   Brief hospital course:  Per H&P: Deanna [name] is a 38 y.o. female with medical history significant for hypertension, paroxysmal atrial fibrillation, hematemesis and alcohol abuse.  The patient has had 1 episode of alcoholic pancreatitis in the past.  She presents today with severe 10 out of 10 abdominal pain.  The pain is all over her abdomen and worse with movement.  She last had a dose of alcohol yesterday and the pain intensified so she has not touched it since then.  Denies fever or vomiting or diarrhea.  She had this once several years ago but it was not as severe at that time.  She quit drinking for a month after she had pancreatitis in the past. In the emergency department the patient had a CT scan of her abdomen and pelvis which confirmed pancreatitis.  There was no evidence of pseudocyst.  Her blood work revealed a low magnesium  of 1.6 and a potassium of 3.5.  Her hemoglobin is elevated at 17.4.  Her white count is 11.2. Her lipase is 794.  Calcium is 9.8  Patient was admitted on the evening of 04/07/2024 for further evaluation and management of uncomplicated acute pancreatitis as outlined in detail below.  Assessment and Plan:  Alcoholic pancreatitis - -- Continue IV fluids -- Gaile to full liquids this morning, soft low fiber if tolerating later today -- IV antiemetics -- Continue pain control with p.o. oxycodone  for pain if tolerating p.o., IV Dilaudid for breakthrough pain  Transaminitis, Hyperbilirubinemia --Likely from alcohol, but rule out gallstone pancreatitis --RUQ U/S showed only hepatic steatosis --Trend LFT's  AKI - not POA, Cr was 0.83 >> 1.30 today. Pt did get IV contrast for CT yesterday. --Continue IV fluids --Monitor BMP --Avoid nephrotoxins and renally dose meds  Thrombocytopenia -not present on  admission. Platelets have from 292 >> 147 >> 96 -- Hold Lovenox -- Monitor CBC  Hypomagnesemia -resolved with replacement Hypokalemia -resolved with replacement --Monitor and replace electrolytes  Constipation -likely from pain meds for pancreatitis -- Bowel regimen ordered  Hypertension  -- Continue metoprolol   Alcohol abuse -on admission, patient denied prior history of withdrawal seizures or DTs. --Monitoring on CIWA protocol with as needed Ativan    Hemoconcentration and dehydration -initial hemoglobin elevated at 17.4, improved with IV fluids to normal, 14.0 today  -- Monitor CBC  History of paroxysmal atrial fibrillation - not on anticoagulation.  Has been in sinus tachycardia.   --Continue metoprolol       Subjective: Patient seen awake resting in bed on rounds this morning.  She reports not having a BM since admission.  She reports ongoing nausea without vomiting.  Is not having any shaking or tremor.  Reports ongoing epigastric abdominal pain for the most part.  Is agreeable to attempt full liquid diet today, possibly soft for lunch shift is okay.  Physical Exam: Vitals:   04/09/24 0242 04/09/24 0534 04/09/24 0625 04/09/24 1220  BP: (!) 144/92  (!) 135/90 (!) 158/94  Pulse: 96  95 97  Resp: 16 16 16 18   Temp: 99 F (37.2 C)  98.8 F (37.1 C) 99.7 F (37.6 C)  TempSrc:    Oral  SpO2: 96%  93% 99%  Weight:      Height:       General exam: awake, alert, no acute distress HEENT: Mucous membranes, hearing  grossly normal Respiratory system: Room air, normal respiratory effort Cardiovascular system: normal S1/S2, regular rate and rhythm, no peripheral edema Gastrointestinal system: soft, non-distended, tenderness with palpation of epigastrium and upper right abdomen, no rebound or guarding Central nervous system: A&O x 3. no gross focal neurologic deficits, normal speech Extremities: moves all , no edema, normal tone Skin: Dry, intact, normal temp Psychiatry:  normal mood, congruent affect, judgement and insight appear normal  Data Reviewed:  Notable labs --  Sodium 133 Albumin 3.1  AST normalized 39  T bili 2.1 >> 2.7 >> 2.5    Family Communication: Mother at bedside on rounds  Disposition: Status is: Inpatient Remains inpatient appropriate because: on IV therapies as above, ongoing nausea, slowly advancing diet   Planned Discharge Destination: Home    Time spent: 42 minutes  Author: Burnard DELENA Cunning, DO 04/09/2024 4:14 PM  For on call review [email or web address]

## 2024-04-09 NOTE — Plan of Care (Signed)
  Problem: Education: Goal: Knowledge of General Education information will improve Description: Including pain rating scale, medication(s)/side effects and non-pharmacologic comfort measures Outcome: Progressing   Problem: Health Behavior/Discharge Planning: Goal: Ability to manage health-related needs will improve Outcome: Progressing   Problem: Clinical Measurements: Goal: Ability to maintain clinical measurements within normal limits will improve Outcome: Progressing Goal: Will remain free from infection Outcome: Progressing Goal: Respiratory complications will improve Outcome: Progressing Goal: Cardiovascular complication will be avoided Outcome: Progressing   Problem: Activity: Goal: Risk for activity intolerance will decrease Outcome: Progressing   Problem: Nutrition: Goal: Adequate nutrition will be maintained Outcome: Progressing   Problem: Elimination: Goal: Will not experience complications related to bowel motility Outcome: Progressing Goal: Will not experience complications related to urinary retention Outcome: Progressing   Problem: Pain Managment: Goal: General experience of comfort will improve and/or be controlled Outcome: Progressing   Problem: Safety: Goal: Ability to remain free from injury will improve Outcome: Progressing   Problem: Skin Integrity: Goal: Risk for impaired skin integrity will decrease Outcome: Progressing   Problem: Nutrition Goal: Patient maintains adequate hydration Outcome: Progressing   Problem: Clinical Measurements: Goal: Diagnostic test results will improve Outcome: Not Progressing   Problem: Coping: Goal: Level of anxiety will decrease Outcome: Not Progressing

## 2024-04-09 NOTE — Plan of Care (Signed)
  Problem: Education: Goal: Knowledge of General Education information will improve Description: Including pain rating scale, medication(s)/side effects and non-pharmacologic comfort measures Outcome: Progressing   Problem: Clinical Measurements: Goal: Will remain free from infection Outcome: Progressing   Problem: Activity: Goal: Risk for activity intolerance will decrease Outcome: Progressing   Problem: Coping: Goal: Level of anxiety will decrease Outcome: Progressing   Problem: Elimination: Goal: Will not experience complications related to urinary retention Outcome: Progressing   Problem: Pain Managment: Goal: General experience of comfort will improve and/or be controlled Outcome: Progressing   Problem: Safety: Goal: Ability to remain free from injury will improve Outcome: Progressing   Problem: Skin Integrity: Goal: Risk for impaired skin integrity will decrease Outcome: Progressing   Problem: Nutrition Goal: Patient maintains adequate hydration Outcome: Progressing

## 2024-04-10 DIAGNOSIS — I480 Paroxysmal atrial fibrillation: Secondary | ICD-10-CM | POA: Diagnosis not present

## 2024-04-10 DIAGNOSIS — I10 Essential (primary) hypertension: Secondary | ICD-10-CM | POA: Diagnosis not present

## 2024-04-10 DIAGNOSIS — K852 Alcohol induced acute pancreatitis without necrosis or infection: Secondary | ICD-10-CM | POA: Diagnosis not present

## 2024-04-10 LAB — CBC
HCT: 38.9 % (ref 36.0–46.0)
Hemoglobin: 13 g/dL (ref 12.0–15.0)
MCH: 31.3 pg (ref 26.0–34.0)
MCHC: 33.4 g/dL (ref 30.0–36.0)
MCV: 93.7 fL (ref 80.0–100.0)
Platelets: 98 K/uL — ABNORMAL LOW (ref 150–400)
RBC: 4.15 MIL/uL (ref 3.87–5.11)
RDW: 16.9 % — ABNORMAL HIGH (ref 11.5–15.5)
WBC: 10.4 K/uL (ref 4.0–10.5)
nRBC: 0 % (ref 0.0–0.2)

## 2024-04-10 LAB — BASIC METABOLIC PANEL WITH GFR
Anion gap: 9 (ref 5–15)
BUN: 8 mg/dL (ref 6–20)
CO2: 26 mmol/L (ref 22–32)
Calcium: 8.9 mg/dL (ref 8.9–10.3)
Chloride: 100 mmol/L (ref 98–111)
Creatinine, Ser: 0.77 mg/dL (ref 0.44–1.00)
GFR, Estimated: >60 mL/min (ref >60)
Glucose, Bld: 100 mg/dL — ABNORMAL HIGH (ref 70–99)
Potassium: 3.7 mmol/L (ref 3.5–5.1)
Sodium: 135 mmol/L (ref 135–145)

## 2024-04-10 MED ORDER — HYDROCODONE-ACETAMINOPHEN 7.5-325 MG PO TABS
1.0000 | ORAL_TABLET | Freq: Four times a day (QID) | ORAL | Status: DC | PRN
  Administered 2024-04-10 – 2024-04-11 (×2): 1 via ORAL
  Filled 2024-04-10 (×2): qty 1

## 2024-04-10 MED ORDER — HYDROXYZINE HCL 25 MG PO TABS
50.0000 mg | ORAL_TABLET | Freq: Three times a day (TID) | ORAL | Status: DC | PRN
  Administered 2024-04-10 – 2024-04-13 (×5): 50 mg via ORAL
  Filled 2024-04-10 (×6): qty 2

## 2024-04-10 MED ORDER — OXYCODONE HCL 5 MG PO TABS
5.0000 mg | ORAL_TABLET | ORAL | Status: DC | PRN
  Administered 2024-04-10 – 2024-04-13 (×6): 10 mg via ORAL
  Administered 2024-04-13: 5 mg via ORAL
  Filled 2024-04-10 (×5): qty 2
  Filled 2024-04-10: qty 1
  Filled 2024-04-10: qty 2

## 2024-04-10 NOTE — Progress Notes (Signed)
 Progress Note   Patient: Deanna [name] FMW:[id number] DOB: 1986-04-05 DOA: 04/07/2024     3 DOS: the patient was seen and examined on 04/10/2024   Brief hospital course:  Per H&P: [name] is a 38 y.o. female with medical history significant for hypertension, paroxysmal atrial fibrillation, hematemesis and alcohol abuse.  The patient has had 1 episode of alcoholic pancreatitis in the past.  She presents today with severe 10 out of 10 abdominal pain.  The pain is all over her abdomen and worse with movement.  She last had a dose of alcohol yesterday and the pain intensified so she has not touched it since then.  Denies fever or vomiting or diarrhea.  She had this once several years ago but it was not as severe at that time.  She quit drinking for a month after she had pancreatitis in the past. In the emergency department the patient had a CT scan of her abdomen and pelvis which confirmed pancreatitis.  There was no evidence of pseudocyst.  Her blood work revealed a low magnesium  of 1.6 and a potassium of 3.5.  Her hemoglobin is elevated at 17.4.  Her white count is 11.2. Her lipase is 794.  Calcium is 9.8  Patient was admitted on the evening of 04/07/2024 for further evaluation and management of uncomplicated acute pancreatitis as outlined in detail below.  Assessment and Plan:  Alcoholic pancreatitis - -- Continue IV fluids -- Diet advanced to soft/low fiber evening of 11/15 -- IV antiemetics -- Continue pain control with p.o. oxycodone  or Norco for pain if tolerating p.o., IV Dilaudid for breakthrough pain -- Monitor abdominal exam -- As needed hydroxyzine for itching.  Try Norco instead of oxycodone  see if patient has less itching  Transaminitis, Hyperbilirubinemia --Likely from alcohol, but rule out gallstone pancreatitis --RUQ U/S showed only hepatic steatosis --Trend LFT's  AKI - not POA, Cr was 0.83 >> 1.30 >> normalized with IV fluids to 0.77. Pt did get IV contrast for  CT on admission. -- Monitor off IV fluids --Monitor BMP --Avoid nephrotoxins and renally dose meds  Thrombocytopenia -not present on admission. Platelets have from 292 >> 147 >> 96 >> 98 -- Hold Lovenox -- Monitor CBC  Hypomagnesemia -resolved with replacement Hypokalemia -resolved with replacement --Monitor and replace electrolytes  Constipation -likely from pain meds for pancreatitis -- Bowel regimen ordered 11/16 patient reported having BM  Hypertension  -- Continue metoprolol   Alcohol abuse -on admission, patient denied prior history of withdrawal seizures or DTs. --Monitoring on CIWA protocol with as needed Ativan   CIWA scores have been low at 4  Hemoconcentration and dehydration -initial hemoglobin elevated at 17.4, improved with IV fluids to normal -- Monitor CBC  History of paroxysmal atrial fibrillation - not on anticoagulation.  Initially sinus tach. Heart rates stable in the 90s --Continue metoprolol       Subjective: Patient awake resting in bed when seen this morning.  She reports not much improvement with itching after 25 mg Atarax.  She is agreeable to try Norco for pain control and see if she has less itching than she does with oxycodone .  Had increased abdominal pain overnight.  Did have a bowel movement after stool softener started yesterday.  Denies nausea or vomiting, mostly issues ongoing severe pain.  Physical Exam: Vitals:   04/09/24 1220 04/09/24 2042 04/09/24 2200 04/10/24 0545  BP: (!) 158/94 (!) 154/94  (!) 151/104  Pulse: 97 97 99 95  Resp: 18  17 18  Temp: 99.7 F (37.6 C)  99.5 F (37.5 C) 99.3 F (37.4 C)  TempSrc: Oral  Oral Oral  SpO2: 99%  96% 97%  Weight:      Height:       General exam: awake, alert, no acute distress, mildly ill-appearing HEENT: Grossly normal, moist mucous membranes Respiratory system: CTAB no wheezes or rhonchi, normal respiratory effort on room air Cardiovascular system: normal S1/S2, RRR, no peripheral  edema Gastrointestinal system: soft, non-distended, persistent tenderness without rebound or guarding of the epigastric and right upper quadrant Central nervous system: A&O x 3. no gross focal neurologic deficits, normal speech Extremities: moves all , no edema, normal tone Skin warm dry intact Psychiatry: normal mood, congruent affect, judgement and insight appear normal  Data Reviewed:  Notable labs --  Normal BMP other than glucose 100  LFTs yesterday had normalized other than T. bili 2.5 and albumin 3.1  CBC with resolved leukocytosis, WBC 10.4 Platelets 98 --were normal on admission to 80   Family Communication: Mother at bedside on rounds  Disposition: Status is: Inpatient Remains inpatient appropriate because: Ongoing significant pain from pancreatitis, needs further clinical improvement    Planned Discharge Destination: Home    Time spent: 38 minutes  Author: Burnard DELENA Cunning, DO 04/10/2024 12:03 PM  For on call review [email or web address]

## 2024-04-10 NOTE — Plan of Care (Signed)

## 2024-04-10 NOTE — Plan of Care (Signed)
  Problem: Education: Goal: Knowledge of General Education information will improve Description: Including pain rating scale, medication(s)/side effects and non-pharmacologic comfort measures Outcome: Progressing   Problem: Health Behavior/Discharge Planning: Goal: Ability to manage health-related needs will improve Outcome: Progressing   Problem: Clinical Measurements: Goal: Ability to maintain clinical measurements within normal limits will improve Outcome: Progressing Goal: Will remain free from infection Outcome: Progressing Goal: Diagnostic test results will improve Outcome: Progressing Goal: Respiratory complications will improve Outcome: Progressing Goal: Cardiovascular complication will be avoided Outcome: Progressing   Problem: Activity: Goal: Risk for activity intolerance will decrease Outcome: Progressing   Problem: Nutrition: Goal: Adequate nutrition will be maintained Outcome: Progressing   Problem: Elimination: Goal: Will not experience complications related to bowel motility Outcome: Progressing Goal: Will not experience complications related to urinary retention Outcome: Progressing   Problem: Safety: Goal: Ability to remain free from injury will improve Outcome: Progressing   Problem: Skin Integrity: Goal: Risk for impaired skin integrity will decrease Outcome: Progressing   Problem: Nutrition Goal: Patient maintains adequate hydration Outcome: Progressing   Problem: Coping: Goal: Level of anxiety will decrease Outcome: Not Progressing   Problem: Pain Managment: Goal: General experience of comfort will improve and/or be controlled Outcome: Not Progressing

## 2024-04-11 DIAGNOSIS — I10 Essential (primary) hypertension: Secondary | ICD-10-CM | POA: Diagnosis not present

## 2024-04-11 DIAGNOSIS — K852 Alcohol induced acute pancreatitis without necrosis or infection: Secondary | ICD-10-CM | POA: Diagnosis not present

## 2024-04-11 LAB — CBC
HCT: 36.7 % (ref 36.0–46.0)
Hemoglobin: 12.2 g/dL (ref 12.0–15.0)
MCH: 31.4 pg (ref 26.0–34.0)
MCHC: 33.2 g/dL (ref 30.0–36.0)
MCV: 94.6 fL (ref 80.0–100.0)
Platelets: 123 K/uL — ABNORMAL LOW (ref 150–400)
RBC: 3.88 MIL/uL (ref 3.87–5.11)
RDW: 17 % — ABNORMAL HIGH (ref 11.5–15.5)
WBC: 10.4 K/uL (ref 4.0–10.5)
nRBC: 0 % (ref 0.0–0.2)

## 2024-04-11 MED ORDER — METOPROLOL TARTRATE 50 MG PO TABS
50.0000 mg | ORAL_TABLET | Freq: Two times a day (BID) | ORAL | Status: DC
  Administered 2024-04-11 – 2024-04-13 (×4): 50 mg via ORAL
  Filled 2024-04-11 (×4): qty 1

## 2024-04-11 MED ORDER — PANTOPRAZOLE SODIUM 40 MG PO TBEC
40.0000 mg | DELAYED_RELEASE_TABLET | Freq: Every day | ORAL | Status: DC
  Administered 2024-04-12 – 2024-04-13 (×2): 40 mg via ORAL
  Filled 2024-04-11 (×2): qty 1

## 2024-04-11 MED ORDER — HYDRALAZINE HCL 20 MG/ML IJ SOLN
10.0000 mg | Freq: Four times a day (QID) | INTRAMUSCULAR | Status: DC | PRN
  Administered 2024-04-11: 10 mg via INTRAVENOUS

## 2024-04-11 NOTE — Plan of Care (Signed)
   Problem: Education: Goal: Knowledge of General Education information will improve Description Including pain rating scale, medication(s)/side effects and non-pharmacologic comfort measures Outcome: Progressing   Problem: Clinical Measurements: Goal: Ability to maintain clinical measurements within normal limits will improve Outcome: Progressing   Problem: Clinical Measurements: Goal: Will remain free from infection Outcome: Progressing

## 2024-04-11 NOTE — Progress Notes (Addendum)
 Progress Note   Patient: Deanna [name] FMW:[id number] DOB: 09-03-1985 DOA: 04/07/2024     4 DOS: the patient was seen and examined on 04/11/2024   Brief hospital course:  Per H&P: MIYANI CRONIC is a 38 y.o. female with medical history significant for hypertension, paroxysmal atrial fibrillation, hematemesis and alcohol abuse.  The patient has had 1 episode of alcoholic pancreatitis in the past.  She presents today with severe 10 out of 10 abdominal pain.  The pain is all over her abdomen and worse with movement.  She last had a dose of alcohol yesterday and the pain intensified so she has not touched it since then.  Denies fever or vomiting or diarrhea.  She had this once several years ago but it was not as severe at that time.  She quit drinking for a month after she had pancreatitis in the past. In the emergency department the patient had a CT scan of her abdomen and pelvis which confirmed pancreatitis.  There was no evidence of pseudocyst.  Her blood work revealed a low magnesium  of 1.6 and a potassium of 3.5.  Her hemoglobin is elevated at 17.4.  Her white count is 11.2. Her lipase is 794.  Calcium is 9.8  Patient was admitted on the evening of 04/07/2024 for further evaluation and management of uncomplicated acute pancreatitis as outlined in detail below.  Assessment and Plan:  Alcoholic pancreatitis - -- Off IV fluids -- Diet advanced to soft/low fiber evening of 11/15 -- IV antiemetics -- Continue pain control with p.o. oxycodone  if tolerating p.o., IV Dilaudid for breakthrough pain -- Monitor abdominal exam -- As needed hydroxyzine for itching.  Tried Norco instead but had minimal pain relief  Transaminitis, Hyperbilirubinemia --Likely from alcohol, but rule out gallstone pancreatitis --RUQ U/S showed only hepatic steatosis --Trend LFT's  AKI - not POA, Cr was 0.83 >> 1.30 >> normalized with IV fluids to 0.77. Pt did get IV contrast for CT on admission. -- Monitor off IV  fluids --Monitor BMP --Avoid nephrotoxins and renally dose meds  Thrombocytopenia -not present on admission. Platelets have from 292 >> 147 >> 96 >> 98 >> 123 -- Holding Lovenox & improving -- Monitor CBC  Hypomagnesemia -resolved with replacement Hypokalemia -resolved with replacement --Monitor and replace electrolytes  Constipation -likely from pain meds for pancreatitis -- Bowel regimen ordered 11/16 patient reported having BM  Hypertension  BP's elevated likely due to pain --IV hydralazine  PRN -- Increase home metoprolol  25 >> 50 mg BID --Monitor BP and titrate regimen --Pain control as above  Alcohol abuse -on admission, patient denied prior history of withdrawal seizures or DTs. --No longer on CIWA. Scores stayed low. She had mild tremor that resolved quickly.  Hemoconcentration and dehydration -initial hemoglobin elevated at 17.4, improved with IV fluids to normal -- Monitor CBC  History of paroxysmal atrial fibrillation - not on anticoagulation.  Initially sinus tach. Heart rates stable in the 90s --Continue metoprolol       Subjective: Patient awake resting in bed when seen this morning. She reports ongoing significant abdominal pain. It gets uncontrolled if medications are not given frequently enough.  No nausea/ vomiting any more.  She expresses determination to stop drinking after going through this.    Physical Exam: Vitals:   04/10/24 1436 04/10/24 2043 04/11/24 0448 04/11/24 1428  BP: (!) 168/100 (!) 164/101 (!) 165/108 (!) 177/109  Pulse: 87 (!) 103 94 (!) 106  Resp: 16 18 16 18   Temp: 97.7 F (36.5  C) 99.8 F (37.7 C) 98.7 F (37.1 C) 99.6 F (37.6 C)  TempSrc:   Oral Oral  SpO2: 99% 100% 98% 98%  Weight:      Height:       General exam: awake, alert, no acute distress, mildly ill-appearing HEENT: Grossly normal, moist mucous membranes Respiratory system: CTAB no wheezes or rhonchi, normal respiratory effort on room air Cardiovascular  system: normal S1/S2, RRR, no peripheral edema Gastrointestinal system: soft, non-distended, persistent tenderness without rebound or guarding of the epigastric and right upper quadrant Central nervous system: A&O x 3. no gross focal neurologic deficits, normal speech Extremities: moves all , no edema, normal tone Skin warm dry intact Psychiatry: normal mood, congruent affect, judgement and insight appear normal  Data Reviewed:  Notable labs --  Last BMP 11/16 other than glucose 100  CBC with Platelets 98 >> 123  WBC normalized   Family Communication: None at bedside. Pt able to update.  Disposition: Status is: Inpatient Remains inpatient appropriate because: Ongoing significant pain from pancreatitis, needs further clinical improvement    Planned Discharge Destination: Home    Time spent: 38 minutes  Author: Burnard DELENA Cunning, DO 04/11/2024 4:30 PM  For on call review [email or web address].

## 2024-04-12 DIAGNOSIS — K852 Alcohol induced acute pancreatitis without necrosis or infection: Secondary | ICD-10-CM | POA: Diagnosis not present

## 2024-04-12 DIAGNOSIS — I10 Essential (primary) hypertension: Secondary | ICD-10-CM | POA: Diagnosis not present

## 2024-04-12 LAB — COMPREHENSIVE METABOLIC PANEL WITH GFR
ALT: 10 U/L (ref 0–44)
AST: 35 U/L (ref 15–41)
Albumin: 2.9 g/dL — ABNORMAL LOW (ref 3.5–5.0)
Alkaline Phosphatase: 76 U/L (ref 38–126)
Anion gap: 11 (ref 5–15)
BUN: 6 mg/dL (ref 6–20)
CO2: 26 mmol/L (ref 22–32)
Calcium: 8.7 mg/dL — ABNORMAL LOW (ref 8.9–10.3)
Chloride: 99 mmol/L (ref 98–111)
Creatinine, Ser: 0.61 mg/dL (ref 0.44–1.00)
GFR, Estimated: >60 mL/min (ref >60)
Glucose, Bld: 77 mg/dL (ref 70–99)
Potassium: 3.8 mmol/L (ref 3.5–5.1)
Sodium: 136 mmol/L (ref 135–145)
Total Bilirubin: 1.9 mg/dL — ABNORMAL HIGH (ref 0.0–1.2)
Total Protein: 6.4 g/dL — ABNORMAL LOW (ref 6.5–8.1)

## 2024-04-12 LAB — CBC
HCT: 36.6 % (ref 36.0–46.0)
Hemoglobin: 12.4 g/dL (ref 12.0–15.0)
MCH: 31.4 pg (ref 26.0–34.0)
MCHC: 33.9 g/dL (ref 30.0–36.0)
MCV: 92.7 fL (ref 80.0–100.0)
Platelets: 168 K/uL (ref 150–400)
RBC: 3.95 MIL/uL (ref 3.87–5.11)
RDW: 16.5 % — ABNORMAL HIGH (ref 11.5–15.5)
WBC: 13.3 K/uL — ABNORMAL HIGH (ref 4.0–10.5)
nRBC: 0 % (ref 0.0–0.2)

## 2024-04-12 LAB — CULTURE, BLOOD (ROUTINE X 2): Culture: NO GROWTH

## 2024-04-12 LAB — LIPASE, BLOOD: Lipase: 100 U/L — ABNORMAL HIGH (ref 11–51)

## 2024-04-12 MED ORDER — HYDROMORPHONE HCL 1 MG/ML IJ SOLN
0.5000 mg | INTRAMUSCULAR | Status: DC | PRN
  Administered 2024-04-12: 1 mg via INTRAVENOUS
  Filled 2024-04-12: qty 1

## 2024-04-12 NOTE — Plan of Care (Signed)

## 2024-04-12 NOTE — Progress Notes (Signed)
 Progress Note   Patient: Deanna [name] FMW:[id number] DOB: 03-20-1986 DOA: 04/07/2024     5 DOS: the patient was seen and examined on 04/12/2024   Brief hospital course:  Per H&P: Deanna [name] is a 38 y.o. female with medical history significant for hypertension, paroxysmal atrial fibrillation, hematemesis and alcohol abuse.  The patient has had 1 episode of alcoholic pancreatitis in the past.  She presents today with severe 10 out of 10 abdominal pain.  The pain is all over her abdomen and worse with movement.  She last had a dose of alcohol yesterday and the pain intensified so she has not touched it since then.  Denies fever or vomiting or diarrhea.  She had this once several years ago but it was not as severe at that time.  She quit drinking for a month after she had pancreatitis in the past. In the emergency department the patient had a CT scan of her abdomen and pelvis which confirmed pancreatitis.  There was no evidence of pseudocyst.  Her blood work revealed a low magnesium  of 1.6 and a potassium of 3.5.  Her hemoglobin is elevated at 17.4.  Her white count is 11.2. Her lipase is 794.  Calcium is 9.8  Patient was admitted on the evening of 04/07/2024 for further evaluation and management of uncomplicated acute pancreatitis as outlined in detail below.   Patient is nearing readiness for discharge, hopefully in 24 hours if pain adequately controlled on oral meds without needing IV.   Assessment and Plan:  Alcoholic pancreatitis - -- Off IV fluids -- Tolerating soft low fiber diet -- IV antiemetics -- Continue pain control with p.o. oxycodone  if tolerating p.o., IV Dilaudid for breakthrough pain -- Monitor abdominal exam -- As needed hydroxyzine for itching.  Tried Norco instead but had minimal pain relief, Norco discontinued  Transaminitis, Hyperbilirubinemia --improving --Likely from alcohol, but rule out gallstone pancreatitis --RUQ U/S showed only hepatic  steatosis --Trend LFT's  AKI - not POA, Cr was 0.83 >> 1.30 >> normalized with IV fluids to 0.61. Pt did get IV contrast for CT on admission. -- Monitor off IV fluids --Monitor BMP --Avoid nephrotoxins and renally dose meds  Thrombocytopenia -not present on admission.  Now resolved with holding Lovenox Platelets have from 292 >> 147 >> 96 >> 98 >> 123 >> 168 -- Hold Lovenox -- Monitor CBC  Hypomagnesemia -resolved with replacement Hypokalemia -resolved with replacement --Monitor and replace electrolytes  Constipation -likely from pain meds for pancreatitis -- Bowel regimen ordered 11/16 patient reported having BM  Hypertension  BP's elevated likely due to pain 11/18 --BPs improved with increased dose metoprolol  --IV hydralazine  PRN -- Increase home metoprolol  25 >> 50 mg BID --Monitor BP and titrate regimen --Pain control as above  Alcohol abuse -on admission, patient denied prior history of withdrawal seizures or DTs. --No longer on CIWA. Scores stayed low. She had mild tremor that resolved quickly.  Hemoconcentration and dehydration -initial hemoglobin elevated at 17.4, improved with IV fluids to normal -- Monitor CBC  History of paroxysmal atrial fibrillation - not on anticoagulation.  Initially sinus tach. Heart rates stable in the 90s --Continue metoprolol       Subjective: Patient awake resting in bed with friend at bedside when seen this morning.  She reports ongoing pain overnight but did have improved pain control overall with oxycodone  being given on a more regular basis.  She wants to be sure this is working to control her pain today and hopefully  go home tomorrow.   Physical Exam: Vitals:   04/11/24 2341 04/12/24 0337 04/12/24 0618 04/12/24 0900  BP:   (!) 137/94 (!) 137/94  Pulse: (!) 102 99 96 96  Resp:   20   Temp:   98.6 F (37 C)   TempSrc:   Oral   SpO2:   98%   Weight:      Height:       General exam: awake, alert, no acute distress HEENT:  Hearing grossly normal, and moist mucous membranes Respiratory system: No respiratory effort on room air Cardiovascular system: normal S1/S2, RRR, no peripheral edema Gastrointestinal system: Soft, nondistended, improved epigastric and right upper quadrant tenderness, no rebound or guarding and bowel sounds present t Central nervous system: A&O x 3. no gross focal neurologic deficits, normal speech Extremities: moves all , no edema, normal tone Skin warm dry intact Psychiatry: normal mood, congruent affect, judgement and insight appear normal  Data Reviewed:  Notable labs --   CMP with calcium 8.7, albumin 2.9, lipase 100 improved from 794, normal AST and ALT, improved total bili from 2.5 down to 1.9 WBC 13.3 Platelet count normalized after being off Lovenox a few days   Family Communication: Friend at bedside.   Disposition: Status is: Inpatient Remains inpatient appropriate because: Ongoing significant pain from pancreatitis, needs further clinical improvement    Planned Discharge Destination: Home    Time spent: 38 minutes  Author: Burnard DELENA Cunning, DO 04/12/2024 11:30 AM  For on call review [email or web address].

## 2024-04-13 ENCOUNTER — Other Ambulatory Visit: Payer: Self-pay

## 2024-04-13 DIAGNOSIS — K852 Alcohol induced acute pancreatitis without necrosis or infection: Secondary | ICD-10-CM | POA: Diagnosis not present

## 2024-04-13 LAB — CBC
HCT: 38.8 % (ref 36.0–46.0)
Hemoglobin: 12.9 g/dL (ref 12.0–15.0)
MCH: 30.9 pg (ref 26.0–34.0)
MCHC: 33.2 g/dL (ref 30.0–36.0)
MCV: 92.8 fL (ref 80.0–100.0)
Platelets: 235 K/uL (ref 150–400)
RBC: 4.18 MIL/uL (ref 3.87–5.11)
RDW: 16.4 % — ABNORMAL HIGH (ref 11.5–15.5)
WBC: 13.8 K/uL — ABNORMAL HIGH (ref 4.0–10.5)
nRBC: 0 % (ref 0.0–0.2)

## 2024-04-13 LAB — CULTURE, BLOOD (ROUTINE X 2)
Culture: NO GROWTH
Special Requests: ADEQUATE

## 2024-04-13 MED ORDER — SENNOSIDES-DOCUSATE SODIUM 8.6-50 MG PO TABS
1.0000 | ORAL_TABLET | Freq: Every day | ORAL | 0 refills | Status: AC
  Filled 2024-04-13: qty 10, 10d supply, fill #0

## 2024-04-13 MED ORDER — PANTOPRAZOLE SODIUM 40 MG PO TBEC: 40.0000 mg | DELAYED_RELEASE_TABLET | Freq: Every day | ORAL | Status: DC

## 2024-04-13 NOTE — Discharge Summary (Signed)
 Physician Discharge Summary  Deanna [name] FMW:[id number] DOB: 04-19-1986 DOA: 04/07/2024  PCP: Vicci Barnie NOVAK, MD  Admit date: 04/07/2024 Discharge date: 04/13/2024  Time spent: 45 minutes  Recommendations for Outpatient Follow-up:  PCP in 1 week, alcohol cessation counseling and support   Discharge Diagnoses:  Principal Problem:   Acute alcoholic pancreatitis Active Problems:   Obesity   Essential hypertension   PAF (paroxysmal atrial fibrillation) (HCC)   Alcohol withdrawal (HCC)   Discharge Condition: Improved  Diet recommendation: Heart healthy  Filed Weights   04/07/24 1731  Weight: 110.7 kg    History of present illness:  38 y.o. female with medical history significant for hypertension, paroxysmal atrial fibrillation, hematemesis and alcohol abuse.  The patient has had 1 episode of alcoholic pancreatitis in the past.  She presents today with severe 10 out of 10 abdominal pain.  The pain is all over her abdomen and worse with movement.  She last had a dose of alcohol yesterday and the pain intensified so she has not touched it since then.  Denies fever or vomiting or diarrhea.  She had this once several years ago but it was not as severe at that time.  She quit drinking for a month after she had pancreatitis in the past. In the emergency department the patient had a CT scan of her abdomen and pelvis which confirmed pancreatitis.  There was no evidence of pseudocyst.  Her blood work revealed a low magnesium  of 1.6 and a potassium of 3.5.  Her hemoglobin is elevated at 17.4.  Her white count is 11.2. Her lipase is 794.  Calcium is 9.8    Hospital Course:   Alcoholic pancreatitis - -imaging was negative for gallstones -History of heavy EtOH use for several years -Improved with bowel rest, conservative management -Now improving, tolerating soft diet -discharged home in a stable condition, advised EtOH cessation  Transaminitis, Hyperbilirubinemia  --improving --Likely from alcohol, ruled out gallstone pancreatitis --RUQ U/S showed only hepatic steatosis -- Improving   AKI - not POA, Cr was 0.83 >> 1.30 >> normalized with IV fluids to 0.61.   Thrombocytopenia -not present on admission.  Now resolved with holding Lovenox Platelets have from 292 >> 147 >> 96 >> 98 >> 123 >> 168 -- Hold Lovenox -- Monitor CBC   Hypomagnesemia -resolved with replacement Hypokalemia -resolved with replacement --Monitor and replace electrolytes   Constipation -likely from pain meds for pancreatitis -- Bowel regimen ordered 11/16 patient reported having BM   Hypertension  Resumed home regimen of metoprolol  and losartan   Alcohol abuse -on admission, patient denied prior history of withdrawal seizures or DTs. --No longer on CIWA. Scores stayed low. She had mild tremor that resolved quickly.   Hemoconcentration and dehydration -initial hemoglobin elevated at 17.4, improved with IV fluids to normal -- Monitor CBC   History of paroxysmal atrial fibrillation - not on anticoagulation.   -This admission did not have any arrhythmias --Continue metoprolol     Discharge Exam: Vitals:   04/13/24 0450 04/13/24 0839  BP: (!) 148/91 (!) 149/93  Pulse: 95 96  Resp: 18 18  Temp: 98.6 F (37 C)   SpO2: 99% 99%   Gen: Awake, Alert, Oriented X 3,  HEENT: no JVD Lungs: Good air movement bilaterally, CTAB CVS: S1S2/RRR Abd: soft, Non tender, non distended, BS present Extremities: No edema Skin: no new rashes on exposed skin   Discharge Instructions   Discharge Instructions     Diet - low sodium heart healthy  Complete by: As directed    Increase activity slowly   Complete by: As directed       Allergies as of 04/13/2024   No Known Allergies      Medication List     STOP taking these medications    folic acid  1 MG tablet Commonly known as: FOLVITE    medroxyPROGESTERone  150 MG/ML injection Commonly known as: DEPO-PROVERA         TAKE these medications    metoprolol  tartrate 25 MG tablet Commonly known as: LOPRESSOR  Take 1 tablet (25 mg total) by mouth 2 (two) times daily.   multivitamin with minerals Tabs tablet Take 1 tablet by mouth daily.   pantoprazole  40 MG tablet Commonly known as: PROTONIX  Take 1 tablet (40 mg total) by mouth daily. Take 1 tablet by mouth twice daily for 2 months then decrease to one tablet one time daily. What changed: when to take this   senna-docusate 8.6-50 MG tablet Commonly known as: Senokot-S Take 1 tablet by mouth at bedtime.   thiamine  100 MG tablet Commonly known as: VITAMIN B1 Take 1 tablet (100 mg total) by mouth daily.   valsartan  40 MG tablet Commonly known as: DIOVAN  Take 1 tablet (40 mg total) by mouth daily.       No Known Allergies    The results of significant diagnostics from this hospitalization (including imaging, microbiology, ancillary and laboratory) are listed below for reference.    Significant Diagnostic Studies: US  Abdomen Limited RUQ (LIVER/GB) Result Date: 04/08/2024 CLINICAL DATA:  Pancreatitis. EXAM: ULTRASOUND ABDOMEN LIMITED RIGHT UPPER QUADRANT COMPARISON:  September 08, 2023 FINDINGS: Gallbladder: No gallstones or wall thickening visualized (2.1 mm). No sonographic Murphy sign noted by sonographer. Common bile duct: Diameter: 7.9 mm Liver: No focal lesion identified. There is diffusely increased echogenicity of the liver parenchyma. Portal vein is patent on color Doppler imaging with normal direction of blood flow towards the liver. Other: It should be noted that the pancreas is not well visualized. IMPRESSION: Hepatic steatosis. Electronically Signed   By: Suzen Dials M.D.   On: 04/08/2024 19:54   CT ABDOMEN PELVIS W CONTRAST Result Date: 04/07/2024 CLINICAL DATA:  Acute abdominal pain. EXAM: CT ABDOMEN AND PELVIS WITH CONTRAST TECHNIQUE: Multidetector CT imaging of the abdomen and pelvis was performed using the standard protocol  following bolus administration of intravenous contrast. RADIATION DOSE REDUCTION: This exam was performed according to the departmental dose-optimization program which includes automated exposure control, adjustment of the mA and/or kV according to patient size and/or use of iterative reconstruction technique. CONTRAST:  100mL OMNIPAQUE IOHEXOL 300 MG/ML  SOLN COMPARISON:  None Available. FINDINGS: Lower chest: No acute abnormality. Hepatobiliary: No focal liver abnormality is seen. No gallstones, gallbladder wall thickening, or biliary dilatation. There is diffuse fatty infiltration of the liver. Pancreas: There is fat stranding, fluid and inflammation surrounding the pancreatic head suspicious for acute pancreatitis. No pancreatic dilatation or discrete fluid collection identified at this time. Spleen: Normal in size without focal abnormality. Adrenals/Urinary Tract: Adrenal glands are unremarkable. Kidneys are normal, without renal calculi, focal lesion, or hydronephrosis. Bladder is unremarkable. Stomach/Bowel: There is a small hiatal hernia. There is wall thickening and inflammation of the duodenal as it approximates the pancreatic head. There is no perforation. There is no pneumatosis or free air. There is no bowel obstruction. The appendix appears normal. Vascular/Lymphatic: No significant vascular findings are present. No enlarged abdominal or pelvic lymph nodes. Reproductive: Uterus and bilateral adnexa are unremarkable. Other: There is a moderate  amount of free fluid in the pelvis. There is central mesenteric haziness. There is no focal abdominal wall hernia. Musculoskeletal: No acute or significant osseous findings. IMPRESSION: 1. Findings compatible with acute pancreatitis. No discrete fluid collection identified at this time. 2. Wall thickening and inflammation of the duodenum as it approximates the pancreatic head, likely reactive. 3. Moderate amount of free fluid in the pelvis. 4. Fatty infiltration  of the liver. 5. Small hiatal hernia. Electronically Signed   By: Greig Pique M.D.   On: 04/07/2024 17:22    Microbiology: Recent Results (from the past 240 hours)  Culture, blood (Routine x 2)     Status: None   Collection Time: 04/07/24  3:40 PM   Specimen: BLOOD RIGHT ARM  Result Value Ref Range Status   Specimen Description   Final    BLOOD RIGHT ARM Performed at [facility], [address] [place], [place] [address]    Special Requests   Final    BOTTLES DRAWN AEROBIC AND ANAEROBIC Blood Culture results may not be optimal due to an inadequate volume of blood received in culture bottles Performed at [facility], [address], [place], [place] [address]    Culture   Final    NO GROWTH 5 DAYS Performed at [facility], [address], [place], [place] [address]    Report Status 04/12/2024 FINAL  Final  Culture, blood (Routine x 2)     Status: None   Collection Time: 04/07/24  3:51 PM   Specimen: BLOOD  Result Value Ref Range Status   Specimen Description   Final    BLOOD BLOOD LEFT ARM Performed at [facility], [address] [place], [place] [address]    Special Requests   Final    BOTTLES DRAWN AEROBIC AND ANAEROBIC Blood Culture adequate volume Performed at [facility], [address] [place], [place] [address]    Culture   Final    NO GROWTH 5 DAYS Performed at [facility], [address] [place], [place] [address]    Report Status 04/13/2024 FINAL  Final  Urine Culture     Status: Abnormal   Collection Time: 04/08/24  2:34 AM   Specimen: Urine, Random  Result Value Ref Range Status   Specimen Description   Final    URINE, RANDOM Performed at [facility], [address], [place], [place] [address]    Special Requests   Final    NONE Reflexed from [id number] Performed at [facility], [address], [place], [place] [address]    Culture  MULTIPLE SPECIES PRESENT, SUGGEST RECOLLECTION (A)  Final   Report Status 04/09/2024 FINAL  Final     Labs: Basic Metabolic Panel: Recent Labs  Lab 04/07/24 1553 04/08/24 0450 04/09/24 0433 04/10/24 0450 04/12/24 0444  NA 139 139 133* 135 136  K 3.5 4.1 4.0 3.7 3.8  CL 102 104 98 100 99  CO2 20* 26 25 26 26   GLUCOSE 134* 107* 98 100* 77  BUN 8 13 12 8 6   CREATININE 0.83 1.30* 0.92 0.77 0.61  CALCIUM 9.8 8.7* 8.9 8.9 8.7*  MG 1.6* 2.2  --   --   --   PHOS 3.3  --   --   --   --    Liver Function Tests: Recent Labs  Lab 04/07/24 1553 04/08/24 0450 04/09/24 0433 04/12/24 0444  AST 126* 59* 39 35  ALT 32 19 12 10   ALKPHOS 104 75 65 76  BILITOT 2.1* 2.7* 2.5* 1.9*  PROT 8.4* 6.6 6.4* 6.4*  ALBUMIN 4.0 3.3* 3.1* 2.9*   Recent Labs  Lab 04/07/24 1553 04/12/24 0444  LIPASE 794* 100*   No results for input(s): AMMONIA in the last 168 hours. CBC: Recent Labs  Lab 04/07/24 1553 04/08/24 0450 04/09/24 0433 04/10/24 0450 04/11/24 0422 04/12/24 0444 04/13/24 0441  WBC 11.2*   < > 12.9* 10.4 10.4 13.3* 13.8*  NEUTROABS 9.1*  --   --   --   --   --   --   HGB 17.4*   < > 13.6 13.0 12.2 12.4 12.9  HCT 51.0*   < > 39.7 38.9 36.7 36.6 38.8  MCV 90.4   < > 92.5 93.7 94.6 92.7 92.8  PLT 292   < > 96* 98* 123* 168 235   < > = values in this interval not displayed.   Cardiac Enzymes: No results for input(s): CKTOTAL, CKMB, CKMBINDEX, TROPONINI in the last 168 hours. BNP: BNP (last 3 results) No results for input(s): BNP in the last 8760 hours.  ProBNP (last 3 results) No results for input(s): PROBNP in the last 8760 hours.  CBG: No results for input(s): GLUCAP in the last 168 hours.     Signed:  Sigurd Pac MD.  [facility] 04/13/2024, 9:41 AM

## 2024-04-13 NOTE — Plan of Care (Signed)

## 2024-04-13 NOTE — Progress Notes (Signed)
 Patient requesting a sleep aid. Dr. Fairy recommends pt get melatonin and magesnium over the counter for insomnia. Patient relayed this information.   AVS teaching provided to patient. No further questions/concerns noted. PIV removed. Patient calling ride her ride and getting dressed.

## 2024-04-13 NOTE — TOC Progression Note (Signed)
 Transition of Care [facility]) - Progression Note    Patient Details  Name: Deanna [name] MRN: [id number] Date of Birth: February 07, 1986  Transition of Care [facility]) CM/SW Contact  Doneta Glenys DASEN, RN Phone Number: 04/13/2024, 9:56 AM  Clinical Narrative:    CM received consult to discuss substance resources with patient. CM already discussed and added resource to AVS.   Expected Discharge Plan: Home/Self Care Barriers to Discharge: No Barriers Identified               Expected Discharge Plan and Services In-house Referral: NA Discharge Planning Services: CM Consult Post Acute Care Choice: NA Living arrangements for the past 2 months: Mobile Home Expected Discharge Date: 04/13/24               DME Arranged: N/A DME Agency: NA       HH Arranged: NA HH Agency: NA         Social Drivers of Health (SDOH) Interventions SDOH Screenings   Food Insecurity: No Food Insecurity (04/07/2024)  Housing: Low Risk  (04/07/2024)  Transportation Needs: No Transportation Needs (04/07/2024)  Utilities: Not At Risk (04/07/2024)  Alcohol Screen: Low Risk  (07/14/2023)  Depression (PHQ2-9): Low Risk  (07/14/2023)  Financial Resource Strain: Low Risk  (07/14/2023)  Physical Activity: Inactive (07/14/2023)  Social Connections: Socially Isolated (07/14/2023)  Stress: No Stress Concern Present (07/14/2023)  Tobacco Use: Low Risk  (04/07/2024)  Health Literacy: Adequate Health Literacy (07/14/2023)    Readmission Risk Interventions    04/08/2024    4:47 PM  Readmission Risk Prevention Plan  Post Dischage Appt Complete  Medication Screening Complete  Transportation Screening Complete

## 2024-04-14 ENCOUNTER — Other Ambulatory Visit: Payer: Self-pay

## 2024-04-14 ENCOUNTER — Telehealth: Payer: Self-pay

## 2024-04-14 MED ORDER — PANTOPRAZOLE SODIUM 40 MG PO TBEC
40.0000 mg | DELAYED_RELEASE_TABLET | Freq: Every day | ORAL | 1 refills | Status: DC
  Filled 2024-04-14: qty 30, 30d supply, fill #0
  Filled 2024-05-16: qty 30, 30d supply, fill #1

## 2024-04-14 NOTE — Transitions of Care (Post Inpatient/ED Visit) (Signed)
   04/14/2024  Name: KHRYSTINA BONNES MRN: [id number] DOB: 1986-01-25  Today's TOC FU Call Status: Today's TOC FU Call Status:: Unsuccessful Call (1st Attempt) Unsuccessful Call (1st Attempt) Date: 04/14/24  Attempted to reach the patient regarding the most recent Inpatient/ED visit.  Follow Up Plan: Additional outreach attempts will be made to reach the patient to complete the Transitions of Care (Post Inpatient/ED visit) call.   Signature  Slater Diesel, RN

## 2024-04-14 NOTE — Telephone Encounter (Signed)
 I called patient and informed her that the prescription for the pantoprazole  has been sent to the pharmacy and she was very appreciative.

## 2024-04-14 NOTE — Telephone Encounter (Signed)
 Copied from CRM [id number]. Topic: General - Other >> Apr 14, 2024 12:19 PM Zebedee SAUNDERS wrote:  Reason for CRM: Pt returning Marvis Bradley, RN call, please call pt at [phone].

## 2024-04-14 NOTE — Telephone Encounter (Signed)
 Sent!

## 2024-04-14 NOTE — Telephone Encounter (Signed)
 Call returned to patient and documented as another TOC call today

## 2024-04-14 NOTE — Transitions of Care (Post Inpatient/ED Visit) (Signed)
   04/14/2024  Name: Deanna [name] MRN: [id number] DOB: 1985-09-03  Today's TOC FU Call Status: Today's TOC FU Call Status:: Successful TOC FU Call Completed Unsuccessful Call (1st Attempt) Date: 04/14/24 [facility] FU Call Complete Date: 04/14/24  Patient's Name and Date of Birth confirmed. DOB, Name  Transition Care Management Follow-up Telephone Call Date of Discharge: 04/13/24 Discharge Facility: [facility]) Type of Discharge: Inpatient Admission Primary Inpatient Discharge Diagnosis:: acute alcoholic pancreatitis How have you been since you were released from the hospital?: Better (She stated she is still feeling weak; but better than she did when she went into the hospital) Any questions or concerns?: No  Items Reviewed: Did you receive and understand the discharge instructions provided?: Yes Medications obtained,verified, and reconciled?: Yes (Medications Reviewed) (The only medication she is taking is the metoprolol .  She said she was not aware of the order for pantoprazole  and ran out of it awhile ago. I told her that I will notify PCP of need for new prescription.) Any new allergies since your discharge?: No Dietary orders reviewed?: Yes Type of Diet Ordered:: low sodium, heart healthy Do you have support at home?: Yes People in Home [RPT]: alone Name of Support/Comfort Primary Source: She said sometimes she has her children  Medications Reviewed Today: Medications Reviewed Today     Reviewed by Marvis Bradley, RN (Case Manager) on 04/14/24 at 1450  Med List Status: <None>   Medication Order Taking? Sig Documenting Provider Last Dose Status Informant  metoprolol  tartrate (LOPRESSOR ) 25 MG tablet [id number]  Take 1 tablet (25 mg total) by mouth 2 (two) times daily. Vicci Barnie NOVAK, MD  Active Self, Pharmacy Records  Multiple Vitamin (MULTIVITAMIN WITH MINERALS) TABS tablet [id number]  Take 1 tablet by mouth daily. Caleen Burgess BROCKS, MD  Active Self, Pharmacy Records   pantoprazole  (PROTONIX ) 40 MG tablet [id number]  Take 1 tablet (40 mg total) by mouth daily. Take 1 tablet by mouth twice daily for 2 months then decrease to one tablet one time daily. Fairy Frames, MD  Active   senna-docusate (SENOKOT-S) 8.6-50 MG tablet [id number]  Take 1 tablet by mouth at bedtime. Fairy Frames, MD  Active   thiamine  (VITAMIN B-1) 100 MG tablet [id number]  Take 1 tablet (100 mg total) by mouth daily.  Patient not taking: Reported on 04/08/2024   Caleen Burgess BROCKS, MD  Active Self, Pharmacy Records  valsartan  (DIOVAN ) 40 MG tablet [id number]  Take 1 tablet (40 mg total) by mouth daily.  Patient not taking: Reported on 04/08/2024   Vicci Barnie NOVAK, MD  Active Self, Pharmacy Records            Home Care and Equipment/Supplies: Were Home Health Services Ordered?: No Any new equipment or medical supplies ordered?: No  Functional Questionnaire: Do you need assistance with bathing/showering or dressing?: No Do you need assistance with meal preparation?: No Do you need assistance with eating?: No Do you have difficulty maintaining continence: No Do you need assistance with getting out of bed/getting out of a chair/moving?: No Do you have difficulty managing or taking your medications?: No  Follow up appointments reviewed: PCP Follow-up appointment confirmed?: Yes Date of PCP follow-up appointment?: 04/27/24 Follow-up Provider: Dr Tanda Warren [facility] Follow-up appointment confirmed?: NA Do you need transportation to your follow-up appointment?: No Do you understand care options if your condition(s) worsen?: Yes-patient verbalized understanding    SIGNATURE Bradley Marvis, RN

## 2024-04-14 NOTE — Telephone Encounter (Signed)
 We reviewed the med list and the only medication she takes is the metoprolol  tartrate.  She is not taking the valsartan  or vitamins.   She also said she was not aware of the order for pantoprazole  and she ran out of it a while ago.  I told her that I would notify her PCP of the need for a new prescription  She requests the prescription be sent to [facility] at [facility]

## 2024-04-15 ENCOUNTER — Other Ambulatory Visit: Payer: Self-pay

## 2024-04-18 ENCOUNTER — Other Ambulatory Visit: Payer: Self-pay

## 2024-04-18 MED ORDER — VALSARTAN 40 MG PO TABS
40.0000 mg | ORAL_TABLET | Freq: Two times a day (BID) | ORAL | 0 refills | Status: AC
  Filled 2024-04-18: qty 180, 90d supply, fill #0

## 2024-04-18 MED ORDER — MIRTAZAPINE 15 MG PO TABS
7.5000 mg | ORAL_TABLET | Freq: Every evening | ORAL | 2 refills | Status: AC
  Filled 2024-04-18: qty 30, 60d supply, fill #0

## 2024-04-18 MED ORDER — METOPROLOL TARTRATE 25 MG PO TABS
25.0000 mg | ORAL_TABLET | Freq: Two times a day (BID) | ORAL | 0 refills | Status: AC
  Filled 2024-04-18 – 2024-05-16 (×2): qty 180, 90d supply, fill #0

## 2024-04-18 MED ORDER — LOPERAMIDE HCL 2 MG PO TABS
ORAL_TABLET | ORAL | 0 refills | Status: AC
  Filled 2024-04-18: qty 24, 6d supply, fill #0

## 2024-04-27 ENCOUNTER — Inpatient Hospital Stay: Admitting: Family Medicine

## 2024-04-29 ENCOUNTER — Other Ambulatory Visit: Payer: Self-pay

## 2024-05-16 ENCOUNTER — Other Ambulatory Visit: Payer: Self-pay

## 2024-06-17 ENCOUNTER — Other Ambulatory Visit: Payer: Self-pay | Admitting: Internal Medicine

## 2024-06-17 ENCOUNTER — Other Ambulatory Visit: Payer: Self-pay

## 2024-06-17 MED ORDER — PANTOPRAZOLE SODIUM 40 MG PO TBEC
40.0000 mg | DELAYED_RELEASE_TABLET | Freq: Every day | ORAL | 1 refills | Status: AC
  Filled 2024-06-17: qty 90, 90d supply, fill #0
